# Patient Record
Sex: Male | Born: 1940
Health system: Southern US, Community
[De-identification: ages and names within clinical notes are randomized; demographics above are authoritative.]

## PROBLEM LIST (undated history)

## (undated) DIAGNOSIS — I10 Essential (primary) hypertension: Secondary | ICD-10-CM

## (undated) DIAGNOSIS — I4891 Unspecified atrial fibrillation: Secondary | ICD-10-CM

## (undated) DIAGNOSIS — I739 Peripheral vascular disease, unspecified: Secondary | ICD-10-CM

## (undated) DIAGNOSIS — E1140 Type 2 diabetes mellitus with diabetic neuropathy, unspecified: Secondary | ICD-10-CM

## (undated) DIAGNOSIS — N182 Chronic kidney disease, stage 2 (mild): Secondary | ICD-10-CM

## (undated) DIAGNOSIS — K219 Gastro-esophageal reflux disease without esophagitis: Secondary | ICD-10-CM

## (undated) DIAGNOSIS — I499 Cardiac arrhythmia, unspecified: Secondary | ICD-10-CM

## (undated) DIAGNOSIS — R9431 Abnormal electrocardiogram [ECG] [EKG]: Secondary | ICD-10-CM

## (undated) DIAGNOSIS — I459 Conduction disorder, unspecified: Secondary | ICD-10-CM

## (undated) DIAGNOSIS — C61 Malignant neoplasm of prostate: Secondary | ICD-10-CM

## (undated) DIAGNOSIS — Z720 Tobacco use: Secondary | ICD-10-CM

## (undated) DIAGNOSIS — I480 Paroxysmal atrial fibrillation: Secondary | ICD-10-CM

## (undated) DIAGNOSIS — E119 Type 2 diabetes mellitus without complications: Secondary | ICD-10-CM

## (undated) DIAGNOSIS — I129 Hypertensive chronic kidney disease with stage 1 through stage 4 chronic kidney disease, or unspecified chronic kidney disease: Secondary | ICD-10-CM

## (undated) DIAGNOSIS — H9190 Unspecified hearing loss, unspecified ear: Secondary | ICD-10-CM

## (undated) HISTORY — DX: Malignant neoplasm of prostate: C61

---

## 2003-02-27 ENCOUNTER — Emergency Department (HOSPITAL_COMMUNITY): Admission: EM | Admit: 2003-02-27 | Discharge: 2003-02-27 | Payer: Self-pay | Admitting: Emergency Medicine

## 2004-01-01 ENCOUNTER — Inpatient Hospital Stay (HOSPITAL_COMMUNITY): Admission: EM | Admit: 2004-01-01 | Discharge: 2004-01-03 | Payer: Self-pay | Admitting: Emergency Medicine

## 2005-02-05 ENCOUNTER — Ambulatory Visit (HOSPITAL_COMMUNITY): Admission: RE | Admit: 2005-02-05 | Discharge: 2005-02-05 | Payer: Self-pay | Admitting: Family Medicine

## 2005-02-07 ENCOUNTER — Ambulatory Visit (HOSPITAL_COMMUNITY): Admission: RE | Admit: 2005-02-07 | Discharge: 2005-02-07 | Payer: Self-pay | Admitting: Family Medicine

## 2006-02-10 ENCOUNTER — Encounter (INDEPENDENT_AMBULATORY_CARE_PROVIDER_SITE_OTHER): Payer: Self-pay | Admitting: Specialist

## 2006-02-10 ENCOUNTER — Ambulatory Visit (HOSPITAL_COMMUNITY): Admission: RE | Admit: 2006-02-10 | Discharge: 2006-02-10 | Payer: Self-pay | Admitting: General Surgery

## 2007-07-13 ENCOUNTER — Ambulatory Visit (HOSPITAL_COMMUNITY): Admission: RE | Admit: 2007-07-13 | Discharge: 2007-07-13 | Payer: Self-pay | Admitting: Family Medicine

## 2008-10-17 ENCOUNTER — Ambulatory Visit (HOSPITAL_COMMUNITY): Admission: RE | Admit: 2008-10-17 | Discharge: 2008-10-17 | Payer: Self-pay | Admitting: Family Medicine

## 2008-10-18 ENCOUNTER — Ambulatory Visit (HOSPITAL_COMMUNITY): Admission: RE | Admit: 2008-10-18 | Discharge: 2008-10-18 | Payer: Self-pay | Admitting: Family Medicine

## 2009-07-03 ENCOUNTER — Ambulatory Visit (HOSPITAL_COMMUNITY): Admission: RE | Admit: 2009-07-03 | Discharge: 2009-07-03 | Payer: Self-pay | Admitting: Family Medicine

## 2009-07-31 ENCOUNTER — Ambulatory Visit (HOSPITAL_COMMUNITY)
Admission: RE | Admit: 2009-07-31 | Discharge: 2009-07-31 | Payer: Self-pay | Source: Home / Self Care | Admitting: Family Medicine

## 2010-05-07 ENCOUNTER — Ambulatory Visit (HOSPITAL_COMMUNITY)
Admission: RE | Admit: 2010-05-07 | Discharge: 2010-05-07 | Payer: Self-pay | Source: Home / Self Care | Attending: Family Medicine | Admitting: Family Medicine

## 2010-05-13 ENCOUNTER — Encounter: Payer: Self-pay | Admitting: Family Medicine

## 2010-09-07 NOTE — H&P (Signed)
NAME:  Steven Boyd, Steven Boyd                       ACCOUNT NO.:  [id number]   MEDICAL RECORD NO.:  [id number]                   PATIENT TYPE:  INP   LOCATION:  5502                                 FACILITY:  [facility]   PHYSICIAN:  Renato Battles, M.D.                  DATE OF BIRTH:  December 10, 1940   DATE OF ADMISSION:  12/31/2003  DATE OF DISCHARGE:                                HISTORY & PHYSICAL   REASON FOR ADMISSION:  Dizziness.   HPI:  The patient is a very pleasant 70 year old white male who has been  experiencing on and off episodes of right hemicranial headache and dizziness  for the last few days with occasional slurred speech, confusion and  forgetfulness.  No weakness, no chest pain, no shortness of breath.  The  patient went to his primary care physician with Dr. Regino Schultze in [place],  [place] for evaluation.  Head CT did not show abnormalities  reportedly, and the patient was scheduled for MRI on Monday, September 12.   REVIEW OF SYSTEMS:  CONSTITUTIONAL:  No symptoms.  No fever, chills or night  sweats.  No weight changes.  GI:  No nausea, vomiting or diarrhea.  Positive  for occasional constipation.  No abdominal pain.  CARDIOPULMONARY:  No  cough, no shortness of breath, no chest pain, no orthopnea or PND.  GU:  No  dysuria, hematuria or retention.   PAST MEDICAL HISTORY:  1.  Hypertension.  2.  Type 2 diabetes.  3.  Hypercholesterolemia.   PAST SURGICAL HISTORY:  Right inguinal hernia repair.   SOCIAL HISTORY:  The patient smokes two packs per day for the last 46 years.  He has been trying to quit for the last few days, replacing cigarettes by  chewing tobacco.  The patient denies alcohol and drugs.  He is a Ecologist.  He lives with his wife in [place].   FAMILY HISTORY:  Noncontributory.   ALLERGIES:  No known drug allergies.   HOME MEDICATIONS:  1.  Atenolol 50 mg p.o. daily.  2.  Cozaar 100 mg p.o. daily.  3.  Hydrochlorothiazide 12.5 mg p.o.  daily.  4.  Lipitor 20 mg p.o. daily.  5.  Glucophage XR 1000 mg p.o. daily.  6.  Aspirin 81 mg p.o. daily.   PHYSICAL EXAMINATION:  GENERAL:  Alert and oriented x 3, no acute distress.  Overweight white male who is comfortable.  VITALS:  Temperature 97.8, heart rate 51, respiratory rate 18, blood  pressure 158/76, O2 sat 99% on room air.  HEENT:  The head is atraumatic and normocephalic.  Pupils are equal, round  and reactive to light and accommodation.  Extraocular movements are intact  bilaterally.  NECK:  No adenopathy, no thyromegaly, no JVD.  Questionable right-sided  carotid bruit.  CHEST:  Clear to auscultation bilaterally.  No wheezing.  HEART:  Regular rate and rhythm.  No murmurs.  ABDOMEN:  Soft, nontender, nondistended.  Hypoactive bowel sounds.  EXTREMITIES:  No cyanosis or clubbing.  The patient has signs of chronic  venous stasis bilaterally.  NEUROLOGIC:  Cranial nerves grossly intact.  There are no sensory or motor  deficits.   STUDIES:  Head CT showed vague right occipital abnormality.   EKG showed first-degree block.   Electrolytes were all within normal limits, including kidney function.   CBC showed a normal white count, elevated hemoglobin at 15.1.   ASSESSMENT AND PLAN:  1.  Transient ischemic attack.  The patient's symptoms are very suspicious      for transient ischemic attack.  We will start the patient on a high dose      of aspirin.  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  2.  Type 2 diabetes.  Continue with the Glucophage.  The patient will be      having ADA diet, Accu-Cheks  and a sliding scale of insulin.  3.  Hypertension.  Continue Cozaar, hydrochlorothiazide and atenolol.      Monitor closely.  4.  Hypercholesterolemia.  Check fasting lipids.  Continue Lipitor.  5.  EKG abnormality.  With the  patient's risk factors, we are going to check      cardiac enzymes and admit patient to telemetry unit.   COORDINATION OF CARE:  The patient has an appointment to receive MRI and  Doppler as an outpatient.  These need to be canceled.  Also, the patient was  supposed to get an appointment to see Dr. Anne Hahn from neurology.  This  probably can be arranged while the patient is in the hospital.                                                Renato Battles, M.D.    SA/MEDQ  D:  01/01/2004  T:  01/01/2004  Job:  [id number]   cc:   Kirk Ruths, M.D.  [address]  [place]  [place] [address]  Fax: [phone]

## 2010-09-07 NOTE — Consult Note (Signed)
NAME:  Steven, Boyd                       ACCOUNT NO.:  [id number]   MEDICAL RECORD NO.:  [id number]                   PATIENT TYPE:  INP   LOCATION:  3038                                 FACILITY:  [facility]   PHYSICIAN:  Marlan Palau, M.D.               DATE OF BIRTH:  10-May-1940   DATE OF CONSULTATION:  01/02/2004  DATE OF DISCHARGE:                                   CONSULTATION   HISTORY OF PRESENT ILLNESS:  Steven Boyd is a 70 year old right-  handed white male born Nov 09, 1940, with a history of diabetes and  tobacco abuse who comes in for an evaluation of headache and dizziness off  and on for 4-5 days prior to admission.  Patient has had in the past  episodes of hard, sharp, jabbing pains lasting only a minute or two in the  right vertex of the head.  Patient also had an episode of dizziness, similar  to what he experienced at this point in October, 2004, unassociated with  headache.  The patient, however, had both headache that was brief in nature  as well as lightheaded dizzy sensations that began prior to admission.  The  headache has not persisted but the dizziness has been off and on associated  with a light floating feeling, no true vertigo, no numbness or weakness on  the face, arms or legs, speech changes, double vision, loss of vision,  swallowing problems, gait disturbance or loss of consciousness.  Patient has  undergone an MRI scan of the brain showing no significant intracranial  pathology, small arachnoid cyst 2-3 cm in the right posterior fossa was  seen.  Patient has had an intracranial MRI angiogram that is unremarkable.  Carotid Doppler study is unremarkable.  Neurology is asked to see this  patient in further evaluation.  The patient has not had any nausea or  vomiting with the above complaints.  Patient does report some blurriness of  vision but has not gotten glasses.   PAST MEDICAL HISTORY:  Significant for:  1.  History of headache and  dizziness as above.  2.  Hernia surgery on the right for inguinal hernia in the past.  3.  History of obesity.  4.  Hypertension.  5.  Diabetes.  6.  Hypercholesterolemia.   Patient just recently quit 4 days ago smoking 2 packs a day, does not drink  alcohol.  Patient has NO KNOWN ALLERGIES.   SOCIAL HISTORY:  Patient is married.  He lives in the [place] area.  He has 4 children who are alive and well.  Patient is a  Naval architect.   FAMILY MEDICAL HISTORY:  Mother died with diabetes, heart disease, had a  cardiac pacer.  Patient's father is alive.  One sister with  hypercholesterolemia.  Three brothers with heart disease, diabetes.  Two  brothers have bipolar disorder.   REVIEW OF SYSTEMS:  Notable  for no recent fevers, chills.  Patient does note  some headache.  Denies neck stiffness.  Denies shortness of breath, chest  pain and has occasional cramp in the right neck.  The patient denies any  problems controlling the bowels or the bladder.  Has some hesitancy both  urination and with the bladder.  Denies any blackout episodes.   PHYSICAL EXAMINATION:  VITALS:  Blood pressure is 133/80, heart rate 57,  respiratory rate 20, temperature afebrile.  In general, the patient is a  moderately, markedly obese white male who is alert, cooperative at the time  of examination.  HEENT EXAMINATION:  Head is atraumatic.  Eyes pupils are equal, round, react  to light, disks are flat  bilaterally.  NECK:  Supple, no carotid bruits are noted.  RESPIRATORY EXAMINATION:  Clear.  CARDIOVASCULAR EXAMINATION:  Reveals a regular rate and rhythm, no obvious  murmurs or rubs noted.  EXTREMITIES:  Are notable for 1-2+ edema bilaterally.  NEUROLOGIC EXAMINATION:  Cranial nerves as above.  Facial asymmetry is  present.  Patient has good sensation to pinprick and soft touch bilaterally.  He has good strength in facial muscles, head turning and shoulder shrug  bilaterally.  Speech is  well enunciated, not aphasic.  Tympanic membranes  are clear bilaterally.  Again, extraocular movements are full, visual fields  are full.  Motor testing reveals 5/5 strength in all fours, good symmetric  motor tone is noted throughout.  Sensory testing reveals some mild decrease  in vibratory sensation of the feet, otherwise pinprick soft to touch,  vibratory sensation is intact throughout.  The patient has good finger-to-  nose, finger-to-toe-to-finger bilaterally.  Patient is able to ambulate  fairly well, has fair tandem gait, Romberg negative, no evidence of pronator  drift is seen.  Deep tendon reflexes reveal depressed ankle jerks  bilaterally, otherwise reflexes are depressed but symmetric throughout.  Toes are neutral bilaterally.   LABORATORY VALUES:  Notable for a CK level of 116, troponin-I of .02, CK MB  1.7, cholesterol panel of 150, total cholesterol with triglycerides 178, HDL  44, VLDL 36, LDL of 70, TSH of 1.61, white count of 7.8, hemoglobin 15.1,  hematocrit 43.3, MCV 89.6, platelets of 162, sodium 138, potassium 4.1,  chloride of 107, glucose of 90, BUN 13, creatinine 1.1, hemoglobin A1c 5.9.   MRI scan of the brain is as above.  Drug screen was negative.  Carotid  Doppler study is unremarkable.  CT scan of the head was negative.   IMPRESSION:  1.  Headache and dizziness.  2.  Ice pick pains right vertex.  3.  Diabetes.  4.  Tobacco abuse.   This patient just recently quit smoking, may have had some increased  problems with dizziness.  Headache associated with this.  Patient reports  episode of dizziness when smoking at times.  Patient has had a full workup  that has included MRI of the brain, intracranial MRI angiogram, carotid  Doppler studies, all of which were unremarkable.  The patient's current  symptomatology is likely benign, therefore.  The patient has posterior fossa arachnoid cyst likely unrelated to his current symptomatology.  Patient does  report  sharp jabbing pains in the right vertex that appear to be consistent  with ice pick pains except they do not migrate.  The patient's headaches and  dizziness may be potentially migrainous in nature, therefore.  I would not  treat the headache or the dizziness  specifically at this point.  Patient  could potentially be discharged soon.  I will be happy to followup in the office with this patient if needed.  No  further workup indicated at this point.  Patient claims he is on low dose  aspirin and will continue low dose aspirin at this point.                                               Marlan Palau, M.D.    CKW/MEDQ  D:  01/02/2004  T:  01/02/2004  Job:  [id number]   cc:   Kirk Ruths, M.D.  [address]  [place]  [place] [address]  Fax: [phone]   Renato Battles, M.D.  Fax: [phone]   [facility]  [address]  [address]  [place], [place] [address]

## 2010-09-07 NOTE — H&P (Signed)
Steven Boyd, Steven Boyd             ACCOUNT NO.:  [id number]   MEDICAL RECORD NO.:  [id number]           PATIENT TYPE:  AMB   LOCATION:                                FACILITY:  [facility]   PHYSICIAN:  Dalia Heading, M.D.  DATE OF BIRTH:  1940/06/09   DATE OF ADMISSION:  01/27/2006  DATE OF DISCHARGE:  LH                                HISTORY & PHYSICAL   CHIEF COMPLAINT:  Family history of colon carcinoma.   HISTORY OF PRESENT ILLNESS:  The patient is a 70 year old white male who is  referred for endoscopic evaluation.  He needs a colonoscopy for family  history of colon carcinoma.  One of his brothers was diagnosed with colon  cancer and is still alive after surgery.  He did not need chemotherapy.  No  abdominal pain, weight loss, nausea, vomiting, diarrhea, constipation,  melena, or hematochezia have been noted.  He has never had a colonoscopy.   PAST MEDICAL HISTORY:  Includes hypertension, non-insulin-dependent diabetes  mellitus.   PAST SURGICAL HISTORY:  Unremarkable.   CURRENT MEDICATIONS:  Metformin, Prinivil, baby aspirin, fish oil, atenolol,  hydrochlorothiazide.   ALLERGIES:  No known drug allergies.   REVIEW OF SYSTEMS:  The patient smokes a pack cigarettes a day.  He denies  any alcohol use.   PHYSICAL EXAMINATION:  GENERAL:  The patient is a well-developed, well-  nourished white male in no acute distress.  LUNGS:  Clear to auscultation with equal breath sounds bilaterally.  HEART:  Reveals a regular rate and rhythm without S3, S4, or murmurs.  ABDOMEN:  Soft, nontender, nondistended.  No hepatosplenomegaly or masses  noted.  RECTAL:  Examination was deferred to the procedure.   IMPRESSION:  Family history of colon carcinoma.   PLAN:  The patient is scheduled for a colonoscopy on January 27, 2006.  The  risks and benefits of the procedure including bleeding and perforation were  fully explained to the patient, who gave informed consent.      Dalia Heading,  M.D.  Electronically Signed     MAJ/MEDQ  D:  01/14/2006  T:  01/14/2006  Job:  [id number]   cc:   Kirk Ruths, M.D.  Fax: [phone]

## 2011-02-18 ENCOUNTER — Ambulatory Visit (HOSPITAL_COMMUNITY)
Admission: RE | Admit: 2011-02-18 | Discharge: 2011-02-18 | Disposition: A | Discharge status: Home / Self Care | Payer: Medicare Other | Source: Ambulatory Visit | Attending: Family Medicine | Admitting: Family Medicine

## 2011-02-18 ENCOUNTER — Other Ambulatory Visit (HOSPITAL_COMMUNITY): Payer: Self-pay | Admitting: Family Medicine

## 2011-02-18 DIAGNOSIS — R059 Cough, unspecified: Secondary | ICD-10-CM | POA: Insufficient documentation

## 2011-02-18 DIAGNOSIS — R05 Cough: Secondary | ICD-10-CM | POA: Insufficient documentation

## 2011-08-26 DIAGNOSIS — E119 Type 2 diabetes mellitus without complications: Secondary | ICD-10-CM | POA: Diagnosis not present

## 2011-08-26 DIAGNOSIS — K219 Gastro-esophageal reflux disease without esophagitis: Secondary | ICD-10-CM | POA: Diagnosis not present

## 2011-08-26 DIAGNOSIS — I10 Essential (primary) hypertension: Secondary | ICD-10-CM | POA: Diagnosis not present

## 2011-09-20 DIAGNOSIS — E119 Type 2 diabetes mellitus without complications: Secondary | ICD-10-CM | POA: Diagnosis not present

## 2011-12-30 DIAGNOSIS — L259 Unspecified contact dermatitis, unspecified cause: Secondary | ICD-10-CM | POA: Diagnosis not present

## 2011-12-30 DIAGNOSIS — Z23 Encounter for immunization: Secondary | ICD-10-CM | POA: Diagnosis not present

## 2011-12-30 DIAGNOSIS — E119 Type 2 diabetes mellitus without complications: Secondary | ICD-10-CM | POA: Diagnosis not present

## 2011-12-30 DIAGNOSIS — Z6830 Body mass index (BMI) 30.0-30.9, adult: Secondary | ICD-10-CM | POA: Diagnosis not present

## 2011-12-30 DIAGNOSIS — I10 Essential (primary) hypertension: Secondary | ICD-10-CM | POA: Diagnosis not present

## 2011-12-30 DIAGNOSIS — Z713 Dietary counseling and surveillance: Secondary | ICD-10-CM | POA: Diagnosis not present

## 2012-05-04 DIAGNOSIS — E785 Hyperlipidemia, unspecified: Secondary | ICD-10-CM | POA: Diagnosis not present

## 2012-05-04 DIAGNOSIS — I10 Essential (primary) hypertension: Secondary | ICD-10-CM | POA: Diagnosis not present

## 2012-05-04 DIAGNOSIS — Z6829 Body mass index (BMI) 29.0-29.9, adult: Secondary | ICD-10-CM | POA: Diagnosis not present

## 2012-05-04 DIAGNOSIS — Z125 Encounter for screening for malignant neoplasm of prostate: Secondary | ICD-10-CM | POA: Diagnosis not present

## 2012-05-04 DIAGNOSIS — E1129 Type 2 diabetes mellitus with other diabetic kidney complication: Secondary | ICD-10-CM | POA: Diagnosis not present

## 2012-07-17 ENCOUNTER — Emergency Department (HOSPITAL_COMMUNITY)
Admission: EM | Admit: 2012-07-17 | Discharge: 2012-07-17 | Disposition: A | Discharge status: Home / Self Care | Payer: Medicare Other | Attending: Emergency Medicine | Admitting: Emergency Medicine

## 2012-07-17 ENCOUNTER — Encounter (HOSPITAL_COMMUNITY): Payer: Self-pay | Admitting: *Deleted

## 2012-07-17 DIAGNOSIS — I10 Essential (primary) hypertension: Secondary | ICD-10-CM | POA: Diagnosis not present

## 2012-07-17 DIAGNOSIS — Z7982 Long term (current) use of aspirin: Secondary | ICD-10-CM | POA: Insufficient documentation

## 2012-07-17 DIAGNOSIS — W57XXXA Bitten or stung by nonvenomous insect and other nonvenomous arthropods, initial encounter: Secondary | ICD-10-CM | POA: Diagnosis not present

## 2012-07-17 DIAGNOSIS — Z79899 Other long term (current) drug therapy: Secondary | ICD-10-CM | POA: Diagnosis not present

## 2012-07-17 DIAGNOSIS — Y929 Unspecified place or not applicable: Secondary | ICD-10-CM | POA: Insufficient documentation

## 2012-07-17 DIAGNOSIS — S30860A Insect bite (nonvenomous) of lower back and pelvis, initial encounter: Secondary | ICD-10-CM | POA: Insufficient documentation

## 2012-07-17 DIAGNOSIS — Y9389 Activity, other specified: Secondary | ICD-10-CM | POA: Insufficient documentation

## 2012-07-17 HISTORY — DX: Essential (primary) hypertension: I10

## 2012-07-17 MED ORDER — LIDOCAINE HCL (PF) 1 % IJ SOLN
INTRAMUSCULAR | Status: AC
  Administered 2012-07-17: 04:00:00
  Filled 2012-07-17: qty 5

## 2012-07-17 MED ORDER — DOXYCYCLINE HYCLATE 100 MG PO CAPS: 100.0000 mg | ORAL_CAPSULE | Freq: Two times a day (BID) | ORAL | Status: DC

## 2012-07-17 MED ORDER — DOXYCYCLINE HYCLATE 100 MG PO TABS
100.0000 mg | ORAL_TABLET | Freq: Once | ORAL | Status: AC
  Administered 2012-07-17: 100 mg via ORAL
  Filled 2012-07-17: qty 1

## 2012-07-17 NOTE — ED Notes (Signed)
Patient states he attempted to remove tick from left lower hip and left parts of tick embedded in his skin. Redness noted to area and black center

## 2012-07-17 NOTE — ED Provider Notes (Signed)
History     CSN: [id number]  Arrival date & time 07/17/12  0229   First MD Initiated Contact with Patient 07/17/12 0243      Chief Complaint  Patient presents with  . Tick Removal    (Consider location/radiation/quality/duration/timing/severity/associated sxs/prior treatment) HPI Steven Boyd is a 72 y.o. male who presents to the Emergency Department complaining of a tick bite to the lower left side of his abdomen that may have occurred on Saturday. He removed a portion of the tick but did not get all of the tick.    Past Medical History  Diagnosis Date  . Hypertension     History reviewed. No pertinent past surgical history.  History reviewed. No pertinent family history.  History  Substance Use Topics  . Smoking status: Not on file  . Smokeless tobacco: Not on file  . Alcohol Use: Not on file      Review of Systems  Constitutional: Negative for fever.       10 Systems reviewed and are negative for acute change except as noted in the HPI.  HENT: Negative for congestion.   Eyes: Negative for discharge and redness.  Respiratory: Negative for cough and shortness of breath.   Cardiovascular: Negative for chest pain.  Gastrointestinal: Negative for vomiting and abdominal pain.  Musculoskeletal: Negative for back pain.  Skin: Negative for rash.  Neurological: Negative for syncope, numbness and headaches.  Psychiatric/Behavioral:       No behavior change.    Allergies  Penicillins  Home Medications   Current Outpatient Rx  Name  Route  Sig  Dispense  Refill  . aspirin 81 MG tablet   Oral   Take 81 mg by mouth daily.         Marland Kitchen atenolol (TENORMIN) 50 MG tablet   Oral   Take 50 mg by mouth daily.         Marland Kitchen lisinopril (PRINIVIL,ZESTRIL) 40 MG tablet   Oral   Take 40 mg by mouth daily.         Marland Kitchen doxycycline (VIBRAMYCIN) 100 MG capsule   Oral   Take 1 capsule (100 mg total) by mouth 2 (two) times daily.   20 capsule   0     BP 179/95   Pulse 54  Temp(Src) 97.7 F (36.5 C) (Oral)  Resp 20  Ht 6\' 2"  (1.88 m)  Wt 215 lb (97.523 kg)  BMI 27.59 kg/m2  SpO2 100%  Physical Exam  Nursing note and vitals reviewed. Constitutional: He appears well-developed and well-nourished.  Awake, alert, nontoxic appearance.  HENT:  Head: Atraumatic.  Eyes: EOM are normal. Pupils are equal, round, and reactive to light. Right eye exhibits no discharge. Left eye exhibits no discharge.  Neck: Neck supple.  Cardiovascular: Normal rate and intact distal pulses.   Pulmonary/Chest: Effort normal and breath sounds normal. He exhibits no tenderness.  Abdominal: Soft. Bowel sounds are normal. There is no tenderness. There is no rebound.  Musculoskeletal: He exhibits no tenderness.  Baseline ROM, no obvious new focal weakness.  Neurological:  Mental status and motor strength appears baseline for patient and situation.  Skin: No rash noted.  Portion of tick in lesion to left lower abdomen.   Psychiatric: He has a normal mood and affect.    ED Course  Procedures (including critical care time)  FOREIGN BODY REMOVAL Using lidocaine 1% 3 ml for anesthesia, numbed area Draped and prepped site Betadine placed on area Using tweezers removed remainder of  tick from lesion on left side of abdomen Patient tolerated procedure well.        1. Tick bite       MDM  Patient with a partial tick on left lower abdomen. Removal successful. Bandaid applied. Doxycycline begun. Pt stable in ED with no significant deterioration in condition.The patient appears reasonably screened and/or stabilized for discharge and I doubt any other medical condition or other [facility] requiring further screening, evaluation, or treatment in the ED at this time prior to discharge.  MDM Reviewed: nursing note and vitals           Nicoletta Dress. Colon Branch, MD 07/17/12 [id number]

## 2012-08-03 DIAGNOSIS — E785 Hyperlipidemia, unspecified: Secondary | ICD-10-CM | POA: Diagnosis not present

## 2012-08-03 DIAGNOSIS — Z6828 Body mass index (BMI) 28.0-28.9, adult: Secondary | ICD-10-CM | POA: Diagnosis not present

## 2012-08-03 DIAGNOSIS — E1129 Type 2 diabetes mellitus with other diabetic kidney complication: Secondary | ICD-10-CM | POA: Diagnosis not present

## 2012-08-03 DIAGNOSIS — I10 Essential (primary) hypertension: Secondary | ICD-10-CM | POA: Diagnosis not present

## 2013-02-08 ENCOUNTER — Ambulatory Visit (HOSPITAL_COMMUNITY)
Admission: RE | Admit: 2013-02-08 | Discharge: 2013-02-08 | Disposition: A | Discharge status: Home / Self Care | Payer: Medicare Other | Source: Ambulatory Visit | Attending: Family Medicine | Admitting: Family Medicine

## 2013-02-08 ENCOUNTER — Other Ambulatory Visit (HOSPITAL_COMMUNITY): Payer: Self-pay | Admitting: Family Medicine

## 2013-02-08 DIAGNOSIS — R059 Cough, unspecified: Secondary | ICD-10-CM | POA: Diagnosis not present

## 2013-02-08 DIAGNOSIS — I10 Essential (primary) hypertension: Secondary | ICD-10-CM

## 2013-02-08 DIAGNOSIS — F17200 Nicotine dependence, unspecified, uncomplicated: Secondary | ICD-10-CM | POA: Diagnosis not present

## 2013-02-08 DIAGNOSIS — R079 Chest pain, unspecified: Secondary | ICD-10-CM | POA: Insufficient documentation

## 2013-02-08 DIAGNOSIS — Z6827 Body mass index (BMI) 27.0-27.9, adult: Secondary | ICD-10-CM | POA: Diagnosis not present

## 2013-02-08 DIAGNOSIS — E119 Type 2 diabetes mellitus without complications: Secondary | ICD-10-CM

## 2013-02-08 DIAGNOSIS — R05 Cough: Secondary | ICD-10-CM | POA: Insufficient documentation

## 2013-02-08 DIAGNOSIS — J984 Other disorders of lung: Secondary | ICD-10-CM | POA: Diagnosis not present

## 2013-02-08 DIAGNOSIS — N390 Urinary tract infection, site not specified: Secondary | ICD-10-CM | POA: Diagnosis not present

## 2013-02-08 DIAGNOSIS — R634 Abnormal weight loss: Secondary | ICD-10-CM | POA: Diagnosis not present

## 2013-02-08 DIAGNOSIS — Z23 Encounter for immunization: Secondary | ICD-10-CM | POA: Diagnosis not present

## 2013-02-16 ENCOUNTER — Other Ambulatory Visit: Payer: Self-pay | Admitting: Urology

## 2013-02-16 ENCOUNTER — Encounter (INDEPENDENT_AMBULATORY_CARE_PROVIDER_SITE_OTHER): Payer: Self-pay

## 2013-02-16 ENCOUNTER — Ambulatory Visit (INDEPENDENT_AMBULATORY_CARE_PROVIDER_SITE_OTHER): Payer: Medicare Other | Admitting: Urology

## 2013-02-16 DIAGNOSIS — R350 Frequency of micturition: Secondary | ICD-10-CM

## 2013-02-16 DIAGNOSIS — R3915 Urgency of urination: Secondary | ICD-10-CM | POA: Diagnosis not present

## 2013-02-16 DIAGNOSIS — R3129 Other microscopic hematuria: Secondary | ICD-10-CM

## 2013-02-16 DIAGNOSIS — M549 Dorsalgia, unspecified: Secondary | ICD-10-CM

## 2013-02-22 ENCOUNTER — Ambulatory Visit (HOSPITAL_COMMUNITY)
Admission: RE | Admit: 2013-02-22 | Discharge: 2013-02-22 | Disposition: A | Discharge status: Home / Self Care | Payer: Medicare Other | Source: Ambulatory Visit | Attending: Urology | Admitting: Urology

## 2013-02-22 ENCOUNTER — Encounter (HOSPITAL_COMMUNITY): Payer: Self-pay

## 2013-02-22 DIAGNOSIS — R911 Solitary pulmonary nodule: Secondary | ICD-10-CM | POA: Diagnosis not present

## 2013-02-22 DIAGNOSIS — R3129 Other microscopic hematuria: Secondary | ICD-10-CM

## 2013-02-22 DIAGNOSIS — N200 Calculus of kidney: Secondary | ICD-10-CM | POA: Diagnosis not present

## 2013-02-22 DIAGNOSIS — R319 Hematuria, unspecified: Secondary | ICD-10-CM | POA: Diagnosis not present

## 2013-02-22 DIAGNOSIS — N400 Enlarged prostate without lower urinary tract symptoms: Secondary | ICD-10-CM | POA: Diagnosis not present

## 2013-02-22 MED ORDER — SODIUM CHLORIDE 0.9 % IV SOLN
INTRAVENOUS | Status: AC
  Filled 2013-02-22: qty 250

## 2013-02-22 MED ORDER — IOHEXOL 300 MG/ML  SOLN
125.0000 mL | Freq: Once | INTRAMUSCULAR | Status: AC | PRN
  Administered 2013-02-22: 125 mL via INTRAVENOUS

## 2013-03-09 ENCOUNTER — Ambulatory Visit (INDEPENDENT_AMBULATORY_CARE_PROVIDER_SITE_OTHER): Payer: Medicare Other | Admitting: Urology

## 2013-03-09 DIAGNOSIS — N401 Enlarged prostate with lower urinary tract symptoms: Secondary | ICD-10-CM | POA: Diagnosis not present

## 2013-03-09 DIAGNOSIS — R3129 Other microscopic hematuria: Secondary | ICD-10-CM

## 2013-03-09 DIAGNOSIS — N200 Calculus of kidney: Secondary | ICD-10-CM

## 2013-03-09 DIAGNOSIS — N529 Male erectile dysfunction, unspecified: Secondary | ICD-10-CM | POA: Diagnosis not present

## 2013-05-13 DIAGNOSIS — J111 Influenza due to unidentified influenza virus with other respiratory manifestations: Secondary | ICD-10-CM | POA: Diagnosis not present

## 2013-05-13 DIAGNOSIS — Z6827 Body mass index (BMI) 27.0-27.9, adult: Secondary | ICD-10-CM | POA: Diagnosis not present

## 2013-08-09 DIAGNOSIS — Z23 Encounter for immunization: Secondary | ICD-10-CM | POA: Diagnosis not present

## 2013-08-09 DIAGNOSIS — Z6827 Body mass index (BMI) 27.0-27.9, adult: Secondary | ICD-10-CM | POA: Diagnosis not present

## 2013-08-09 DIAGNOSIS — K219 Gastro-esophageal reflux disease without esophagitis: Secondary | ICD-10-CM | POA: Diagnosis not present

## 2013-08-09 DIAGNOSIS — E119 Type 2 diabetes mellitus without complications: Secondary | ICD-10-CM | POA: Diagnosis not present

## 2013-08-09 DIAGNOSIS — I10 Essential (primary) hypertension: Secondary | ICD-10-CM | POA: Diagnosis not present

## 2014-02-14 ENCOUNTER — Other Ambulatory Visit (HOSPITAL_COMMUNITY): Payer: Self-pay | Admitting: Internal Medicine

## 2014-02-14 DIAGNOSIS — E119 Type 2 diabetes mellitus without complications: Secondary | ICD-10-CM | POA: Diagnosis not present

## 2014-02-14 DIAGNOSIS — Z23 Encounter for immunization: Secondary | ICD-10-CM | POA: Diagnosis not present

## 2014-02-14 DIAGNOSIS — R109 Unspecified abdominal pain: Secondary | ICD-10-CM

## 2014-02-14 DIAGNOSIS — I10 Essential (primary) hypertension: Secondary | ICD-10-CM | POA: Diagnosis not present

## 2014-02-14 DIAGNOSIS — E663 Overweight: Secondary | ICD-10-CM | POA: Diagnosis not present

## 2014-02-14 DIAGNOSIS — R079 Chest pain, unspecified: Secondary | ICD-10-CM | POA: Diagnosis not present

## 2014-02-14 DIAGNOSIS — Z6827 Body mass index (BMI) 27.0-27.9, adult: Secondary | ICD-10-CM | POA: Diagnosis not present

## 2014-02-17 ENCOUNTER — Ambulatory Visit (HOSPITAL_COMMUNITY): Payer: Medicare Other

## 2014-02-21 ENCOUNTER — Other Ambulatory Visit (HOSPITAL_COMMUNITY): Payer: 59

## 2014-02-21 ENCOUNTER — Ambulatory Visit (HOSPITAL_COMMUNITY)
Admission: RE | Admit: 2014-02-21 | Discharge: 2014-02-21 | Disposition: A | Discharge status: Home / Self Care | Payer: Medicare Other | Source: Ambulatory Visit | Attending: Internal Medicine | Admitting: Internal Medicine

## 2014-02-21 DIAGNOSIS — I714 Abdominal aortic aneurysm, without rupture: Secondary | ICD-10-CM | POA: Diagnosis not present

## 2014-02-21 DIAGNOSIS — R1013 Epigastric pain: Secondary | ICD-10-CM | POA: Diagnosis not present

## 2014-02-21 DIAGNOSIS — R109 Unspecified abdominal pain: Secondary | ICD-10-CM | POA: Diagnosis not present

## 2014-02-28 DIAGNOSIS — Z720 Tobacco use: Secondary | ICD-10-CM | POA: Diagnosis not present

## 2014-02-28 DIAGNOSIS — E119 Type 2 diabetes mellitus without complications: Secondary | ICD-10-CM | POA: Diagnosis not present

## 2014-02-28 DIAGNOSIS — I209 Angina pectoris, unspecified: Secondary | ICD-10-CM | POA: Diagnosis not present

## 2014-02-28 DIAGNOSIS — I10 Essential (primary) hypertension: Secondary | ICD-10-CM | POA: Diagnosis not present

## 2014-03-14 DIAGNOSIS — Z720 Tobacco use: Secondary | ICD-10-CM | POA: Diagnosis not present

## 2014-03-14 DIAGNOSIS — I10 Essential (primary) hypertension: Secondary | ICD-10-CM | POA: Diagnosis not present

## 2014-03-14 DIAGNOSIS — I209 Angina pectoris, unspecified: Secondary | ICD-10-CM | POA: Diagnosis not present

## 2014-03-14 DIAGNOSIS — E119 Type 2 diabetes mellitus without complications: Secondary | ICD-10-CM | POA: Diagnosis not present

## 2014-05-23 DIAGNOSIS — H43393 Other vitreous opacities, bilateral: Secondary | ICD-10-CM | POA: Diagnosis not present

## 2014-05-30 DIAGNOSIS — E119 Type 2 diabetes mellitus without complications: Secondary | ICD-10-CM | POA: Diagnosis not present

## 2014-05-30 DIAGNOSIS — E1140 Type 2 diabetes mellitus with diabetic neuropathy, unspecified: Secondary | ICD-10-CM | POA: Diagnosis not present

## 2014-05-30 DIAGNOSIS — Z23 Encounter for immunization: Secondary | ICD-10-CM | POA: Diagnosis not present

## 2014-05-30 DIAGNOSIS — Z125 Encounter for screening for malignant neoplasm of prostate: Secondary | ICD-10-CM | POA: Diagnosis not present

## 2014-05-30 DIAGNOSIS — E663 Overweight: Secondary | ICD-10-CM | POA: Diagnosis not present

## 2014-05-30 DIAGNOSIS — Z6828 Body mass index (BMI) 28.0-28.9, adult: Secondary | ICD-10-CM | POA: Diagnosis not present

## 2014-05-30 DIAGNOSIS — I10 Essential (primary) hypertension: Secondary | ICD-10-CM | POA: Diagnosis not present

## 2014-06-01 DIAGNOSIS — E119 Type 2 diabetes mellitus without complications: Secondary | ICD-10-CM | POA: Diagnosis not present

## 2014-06-20 DIAGNOSIS — N5201 Erectile dysfunction due to arterial insufficiency: Secondary | ICD-10-CM | POA: Diagnosis not present

## 2014-06-20 DIAGNOSIS — N401 Enlarged prostate with lower urinary tract symptoms: Secondary | ICD-10-CM | POA: Diagnosis not present

## 2014-06-20 DIAGNOSIS — R350 Frequency of micturition: Secondary | ICD-10-CM | POA: Diagnosis not present

## 2014-06-20 DIAGNOSIS — R972 Elevated prostate specific antigen [PSA]: Secondary | ICD-10-CM | POA: Diagnosis not present

## 2014-12-05 DIAGNOSIS — I10 Essential (primary) hypertension: Secondary | ICD-10-CM | POA: Diagnosis not present

## 2014-12-05 DIAGNOSIS — B353 Tinea pedis: Secondary | ICD-10-CM | POA: Diagnosis not present

## 2014-12-05 DIAGNOSIS — B351 Tinea unguium: Secondary | ICD-10-CM | POA: Diagnosis not present

## 2014-12-05 DIAGNOSIS — E119 Type 2 diabetes mellitus without complications: Secondary | ICD-10-CM | POA: Diagnosis not present

## 2014-12-05 DIAGNOSIS — Z6827 Body mass index (BMI) 27.0-27.9, adult: Secondary | ICD-10-CM | POA: Diagnosis not present

## 2014-12-05 DIAGNOSIS — Z1389 Encounter for screening for other disorder: Secondary | ICD-10-CM | POA: Diagnosis not present

## 2014-12-23 DIAGNOSIS — S72142A Displaced intertrochanteric fracture of left femur, initial encounter for closed fracture: Secondary | ICD-10-CM | POA: Diagnosis not present

## 2014-12-23 DIAGNOSIS — M25551 Pain in right hip: Secondary | ICD-10-CM | POA: Diagnosis not present

## 2014-12-23 DIAGNOSIS — S0101XA Laceration without foreign body of scalp, initial encounter: Secondary | ICD-10-CM | POA: Diagnosis not present

## 2014-12-23 DIAGNOSIS — W1789XA Other fall from one level to another, initial encounter: Secondary | ICD-10-CM | POA: Diagnosis not present

## 2014-12-23 DIAGNOSIS — G8911 Acute pain due to trauma: Secondary | ICD-10-CM | POA: Diagnosis not present

## 2014-12-23 DIAGNOSIS — M25552 Pain in left hip: Secondary | ICD-10-CM | POA: Diagnosis not present

## 2014-12-23 DIAGNOSIS — S098XXA Other specified injuries of head, initial encounter: Secondary | ICD-10-CM | POA: Diagnosis not present

## 2014-12-23 DIAGNOSIS — S060X0A Concussion without loss of consciousness, initial encounter: Secondary | ICD-10-CM | POA: Diagnosis not present

## 2014-12-24 ENCOUNTER — Telehealth: Payer: Self-pay | Admitting: Allergy & Immunology

## 2014-12-24 ENCOUNTER — Inpatient Hospital Stay (HOSPITAL_COMMUNITY): Payer: Medicare Other | Admitting: Anesthesiology

## 2014-12-24 ENCOUNTER — Inpatient Hospital Stay (HOSPITAL_COMMUNITY): Payer: Medicare Other

## 2014-12-24 ENCOUNTER — Inpatient Hospital Stay: Admit: 2014-12-24 | Payer: Self-pay | Admitting: Orthopedic Surgery

## 2014-12-24 ENCOUNTER — Inpatient Hospital Stay (HOSPITAL_COMMUNITY)
Admission: AD | Admit: 2014-12-24 | Discharge: 2014-12-27 | DRG: 481 | Disposition: A | Discharge status: Home Health | Payer: Medicare Other | Source: Other Acute Inpatient Hospital | Attending: Internal Medicine | Admitting: Internal Medicine

## 2014-12-24 ENCOUNTER — Encounter (HOSPITAL_COMMUNITY)
Admission: AD | Disposition: A | Discharge status: Home Health | Payer: Self-pay | Source: Other Acute Inpatient Hospital | Attending: Internal Medicine

## 2014-12-24 ENCOUNTER — Encounter (HOSPITAL_COMMUNITY): Payer: Self-pay | Admitting: Family

## 2014-12-24 DIAGNOSIS — W19XXXA Unspecified fall, initial encounter: Secondary | ICD-10-CM | POA: Diagnosis not present

## 2014-12-24 DIAGNOSIS — Z7982 Long term (current) use of aspirin: Secondary | ICD-10-CM

## 2014-12-24 DIAGNOSIS — S72132A Displaced apophyseal fracture of left femur, initial encounter for closed fracture: Secondary | ICD-10-CM | POA: Diagnosis not present

## 2014-12-24 DIAGNOSIS — Z8781 Personal history of (healed) traumatic fracture: Secondary | ICD-10-CM

## 2014-12-24 DIAGNOSIS — Z79899 Other long term (current) drug therapy: Secondary | ICD-10-CM

## 2014-12-24 DIAGNOSIS — Z008 Encounter for other general examination: Secondary | ICD-10-CM | POA: Diagnosis not present

## 2014-12-24 DIAGNOSIS — R001 Bradycardia, unspecified: Secondary | ICD-10-CM | POA: Diagnosis not present

## 2014-12-24 DIAGNOSIS — S7292XA Unspecified fracture of left femur, initial encounter for closed fracture: Secondary | ICD-10-CM

## 2014-12-24 DIAGNOSIS — S72142A Displaced intertrochanteric fracture of left femur, initial encounter for closed fracture: Secondary | ICD-10-CM | POA: Diagnosis not present

## 2014-12-24 DIAGNOSIS — H9190 Unspecified hearing loss, unspecified ear: Secondary | ICD-10-CM | POA: Diagnosis present

## 2014-12-24 DIAGNOSIS — W010XXD Fall on same level from slipping, tripping and stumbling without subsequent striking against object, subsequent encounter: Secondary | ICD-10-CM | POA: Diagnosis present

## 2014-12-24 DIAGNOSIS — E119 Type 2 diabetes mellitus without complications: Secondary | ICD-10-CM | POA: Diagnosis not present

## 2014-12-24 DIAGNOSIS — Z880 Allergy status to penicillin: Secondary | ICD-10-CM | POA: Diagnosis not present

## 2014-12-24 DIAGNOSIS — I10 Essential (primary) hypertension: Secondary | ICD-10-CM | POA: Diagnosis present

## 2014-12-24 DIAGNOSIS — D62 Acute posthemorrhagic anemia: Secondary | ICD-10-CM | POA: Diagnosis not present

## 2014-12-24 DIAGNOSIS — Z9889 Other specified postprocedural states: Secondary | ICD-10-CM

## 2014-12-24 DIAGNOSIS — S7222XA Displaced subtrochanteric fracture of left femur, initial encounter for closed fracture: Secondary | ICD-10-CM | POA: Diagnosis not present

## 2014-12-24 DIAGNOSIS — S72002A Fracture of unspecified part of neck of left femur, initial encounter for closed fracture: Secondary | ICD-10-CM

## 2014-12-24 HISTORY — DX: Unspecified hearing loss, unspecified ear: H91.90

## 2014-12-24 HISTORY — PX: FEMUR IM NAIL: SHX1597

## 2014-12-24 HISTORY — DX: Type 2 diabetes mellitus without complications: E11.9

## 2014-12-24 LAB — SURGICAL PCR SCREEN
MRSA, PCR: NEGATIVE
STAPHYLOCOCCUS AUREUS: POSITIVE — AB

## 2014-12-24 LAB — COMPREHENSIVE METABOLIC PANEL
ALBUMIN: 3.6 g/dL (ref 3.5–5.0)
ALK PHOS: 72 U/L (ref 38–126)
ALT: 23 U/L (ref 17–63)
AST: 28 U/L (ref 15–41)
Anion gap: 5 (ref 5–15)
BILIRUBIN TOTAL: 1.5 mg/dL — AB (ref 0.3–1.2)
BUN: 10 mg/dL (ref 6–20)
CALCIUM: 8.7 mg/dL — AB (ref 8.9–10.3)
CO2: 27 mmol/L (ref 22–32)
Chloride: 105 mmol/L (ref 101–111)
Creatinine, Ser: 0.97 mg/dL (ref 0.61–1.24)
GFR calc Af Amer: >60 mL/min (ref >60)
GLUCOSE: 135 mg/dL — AB (ref 65–99)
Potassium: 3.8 mmol/L (ref 3.5–5.1)
Sodium: 137 mmol/L (ref 135–145)
TOTAL PROTEIN: 6.5 g/dL (ref 6.5–8.1)

## 2014-12-24 LAB — CBC
HCT: 41.8 % (ref 39.0–52.0)
Hemoglobin: 14.2 g/dL (ref 13.0–17.0)
MCH: 30.7 pg (ref 26.0–34.0)
MCHC: 34 g/dL (ref 30.0–36.0)
MCV: 90.3 fL (ref 78.0–100.0)
PLATELETS: 128 10*3/uL — AB (ref 150–400)
RBC: 4.63 MIL/uL (ref 4.22–5.81)
RDW: 12.8 % (ref 11.5–15.5)
WBC: 8.4 10*3/uL (ref 4.0–10.5)

## 2014-12-24 LAB — TSH: TSH: 1.186 u[IU]/mL (ref 0.350–4.500)

## 2014-12-24 LAB — GLUCOSE, CAPILLARY: Glucose-Capillary: 106 mg/dL — ABNORMAL HIGH (ref 65–99)

## 2014-12-24 SURGERY — INSERTION, INTRAMEDULLARY ROD, FEMUR
Anesthesia: General | Site: Hip | Laterality: Left

## 2014-12-24 MED ORDER — CEFAZOLIN SODIUM-DEXTROSE 2-3 GM-% IV SOLR
INTRAVENOUS | Status: DC | PRN
  Administered 2014-12-24: 2 g via INTRAVENOUS

## 2014-12-24 MED ORDER — CEFAZOLIN SODIUM-DEXTROSE 2-3 GM-% IV SOLR
2.0000 g | INTRAVENOUS | Status: DC
  Filled 2014-12-24 (×2): qty 50

## 2014-12-24 MED ORDER — PROPOFOL 10 MG/ML IV BOLUS
INTRAVENOUS | Status: DC | PRN
  Administered 2014-12-24: 170 mg via INTRAVENOUS

## 2014-12-24 MED ORDER — MUPIROCIN 2 % EX OINT
TOPICAL_OINTMENT | Freq: Two times a day (BID) | CUTANEOUS | Status: DC
  Administered 2014-12-25 – 2014-12-26 (×3): via NASAL
  Administered 2014-12-26: 1 via NASAL
  Administered 2014-12-27: 10:00:00 via NASAL
  Filled 2014-12-24 (×2): qty 22

## 2014-12-24 MED ORDER — ACETAMINOPHEN 650 MG RE SUPP: 650.0000 mg | Freq: Four times a day (QID) | RECTAL | Status: DC | PRN

## 2014-12-24 MED ORDER — BACLOFEN 10 MG PO TABS: 10.0000 mg | ORAL_TABLET | Freq: Three times a day (TID) | ORAL | Status: DC

## 2014-12-24 MED ORDER — SENNA 8.6 MG PO TABS
1.0000 | ORAL_TABLET | Freq: Two times a day (BID) | ORAL | Status: DC
  Administered 2014-12-25 – 2014-12-27 (×5): 8.6 mg via ORAL
  Filled 2014-12-24 (×5): qty 1

## 2014-12-24 MED ORDER — FERROUS SULFATE 325 (65 FE) MG PO TABS
325.0000 mg | ORAL_TABLET | Freq: Three times a day (TID) | ORAL | Status: DC
  Administered 2014-12-25 – 2014-12-27 (×6): 325 mg via ORAL
  Filled 2014-12-24 (×7): qty 1

## 2014-12-24 MED ORDER — LISINOPRIL 40 MG PO TABS
40.0000 mg | ORAL_TABLET | Freq: Every day | ORAL | Status: DC
  Administered 2014-12-24 – 2014-12-27 (×4): 40 mg via ORAL
  Filled 2014-12-24 (×4): qty 1

## 2014-12-24 MED ORDER — ENOXAPARIN SODIUM 40 MG/0.4ML ~~LOC~~ SOLN
40.0000 mg | SUBCUTANEOUS | Status: DC
  Administered 2014-12-25 – 2014-12-27 (×3): 40 mg via SUBCUTANEOUS
  Filled 2014-12-24 (×3): qty 0.4

## 2014-12-24 MED ORDER — OXYCODONE HCL 5 MG PO TABS: 5.0000 mg | ORAL_TABLET | ORAL | Status: DC | PRN

## 2014-12-24 MED ORDER — SODIUM CHLORIDE 0.9 % IV SOLN
INTRAVENOUS | Status: DC
  Administered 2014-12-24: 08:00:00 via INTRAVENOUS

## 2014-12-24 MED ORDER — LIDOCAINE HCL (CARDIAC) 20 MG/ML IV SOLN
INTRAVENOUS | Status: DC | PRN
  Administered 2014-12-24: 50 mg via INTRAVENOUS

## 2014-12-24 MED ORDER — HYDROMORPHONE HCL 1 MG/ML IJ SOLN
INTRAMUSCULAR | Status: AC
  Filled 2014-12-24: qty 1

## 2014-12-24 MED ORDER — MAGNESIUM CITRATE PO SOLN: 1.0000 | Freq: Once | ORAL | Status: DC | PRN

## 2014-12-24 MED ORDER — POLYETHYLENE GLYCOL 3350 17 G PO PACK: 17.0000 g | PACK | Freq: Every day | ORAL | Status: DC | PRN

## 2014-12-24 MED ORDER — EPHEDRINE SULFATE 50 MG/ML IJ SOLN
INTRAMUSCULAR | Status: DC | PRN
  Administered 2014-12-24 (×2): 10 mg via INTRAVENOUS

## 2014-12-24 MED ORDER — OXYCODONE HCL 5 MG PO TABS
5.0000 mg | ORAL_TABLET | ORAL | Status: DC | PRN
  Administered 2014-12-25 (×3): 10 mg via ORAL
  Administered 2014-12-25: 5 mg via ORAL
  Administered 2014-12-26 – 2014-12-27 (×4): 10 mg via ORAL
  Filled 2014-12-24: qty 1
  Filled 2014-12-24 (×7): qty 2

## 2014-12-24 MED ORDER — OXYCODONE-ACETAMINOPHEN 5-325 MG PO TABS: 1.0000 | ORAL_TABLET | Freq: Four times a day (QID) | ORAL | Status: DC | PRN

## 2014-12-24 MED ORDER — LACTATED RINGERS IV SOLN
INTRAVENOUS | Status: DC | PRN
  Administered 2014-12-24 (×2): via INTRAVENOUS

## 2014-12-24 MED ORDER — SODIUM CHLORIDE 0.9 % IV SOLN
75.0000 mL/h | INTRAVENOUS | Status: DC
  Administered 2014-12-24 – 2014-12-26 (×2): 75 mL/h via INTRAVENOUS

## 2014-12-24 MED ORDER — OXYCODONE HCL 5 MG PO TABS: 5.0000 mg | ORAL_TABLET | Freq: Once | ORAL | Status: DC | PRN

## 2014-12-24 MED ORDER — MENTHOL 3 MG MT LOZG: 1.0000 | LOZENGE | OROMUCOSAL | Status: DC | PRN

## 2014-12-24 MED ORDER — 0.9 % SODIUM CHLORIDE (POUR BTL) OPTIME
TOPICAL | Status: DC | PRN
  Administered 2014-12-24: 1000 mL

## 2014-12-24 MED ORDER — ENOXAPARIN SODIUM 40 MG/0.4ML ~~LOC~~ SOLN: 40.0000 mg | SUBCUTANEOUS | Status: DC

## 2014-12-24 MED ORDER — MORPHINE SULFATE (PF) 2 MG/ML IV SOLN
2.0000 mg | INTRAVENOUS | Status: DC | PRN
  Administered 2014-12-27 (×2): 2 mg via INTRAVENOUS
  Filled 2014-12-24 (×2): qty 1

## 2014-12-24 MED ORDER — PROPOFOL 10 MG/ML IV BOLUS
INTRAVENOUS | Status: AC
  Filled 2014-12-24: qty 20

## 2014-12-24 MED ORDER — OXYCODONE HCL 5 MG/5ML PO SOLN: 5.0000 mg | Freq: Once | ORAL | Status: DC | PRN

## 2014-12-24 MED ORDER — HYDROMORPHONE HCL 1 MG/ML IJ SOLN
0.2500 mg | INTRAMUSCULAR | Status: DC | PRN
  Administered 2014-12-24 (×2): 0.5 mg via INTRAVENOUS

## 2014-12-24 MED ORDER — PANTOPRAZOLE SODIUM 40 MG PO TBEC
40.0000 mg | DELAYED_RELEASE_TABLET | Freq: Every day | ORAL | Status: DC
  Administered 2014-12-24 – 2014-12-27 (×4): 40 mg via ORAL
  Filled 2014-12-24 (×4): qty 1

## 2014-12-24 MED ORDER — ACETAMINOPHEN 325 MG PO TABS: 650.0000 mg | ORAL_TABLET | Freq: Four times a day (QID) | ORAL | Status: DC | PRN

## 2014-12-24 MED ORDER — FENTANYL CITRATE (PF) 250 MCG/5ML IJ SOLN
INTRAMUSCULAR | Status: DC | PRN
  Administered 2014-12-24 (×3): 50 ug via INTRAVENOUS
  Administered 2014-12-24: 100 ug via INTRAVENOUS

## 2014-12-24 MED ORDER — ONDANSETRON HCL 4 MG PO TABS: 4.0000 mg | ORAL_TABLET | Freq: Four times a day (QID) | ORAL | Status: DC | PRN

## 2014-12-24 MED ORDER — PHENOL 1.4 % MT LIQD: 1.0000 | OROMUCOSAL | Status: DC | PRN

## 2014-12-24 MED ORDER — ONDANSETRON HCL 4 MG/2ML IJ SOLN
4.0000 mg | Freq: Four times a day (QID) | INTRAMUSCULAR | Status: DC | PRN
  Administered 2014-12-24: 4 mg via INTRAVENOUS
  Filled 2014-12-24: qty 2

## 2014-12-24 MED ORDER — SUCCINYLCHOLINE CHLORIDE 20 MG/ML IJ SOLN
INTRAMUSCULAR | Status: DC | PRN
  Administered 2014-12-24: 100 mg via INTRAVENOUS

## 2014-12-24 MED ORDER — ACETAMINOPHEN 325 MG PO TABS
650.0000 mg | ORAL_TABLET | Freq: Four times a day (QID) | ORAL | Status: DC | PRN
Start: 2014-12-24 — End: 2014-12-27
  Administered 2014-12-25 (×2): 650 mg via ORAL
  Filled 2014-12-24 (×2): qty 2

## 2014-12-24 MED ORDER — CALCIUM CARBONATE-VITAMIN D 500-200 MG-UNIT PO TABS
1.0000 | ORAL_TABLET | Freq: Every day | ORAL | Status: DC
Start: 2014-12-24 — End: 2016-03-13

## 2014-12-24 MED ORDER — ALUM & MAG HYDROXIDE-SIMETH 200-200-20 MG/5ML PO SUSP: 30.0000 mL | Freq: Four times a day (QID) | ORAL | Status: DC | PRN

## 2014-12-24 MED ORDER — CEFAZOLIN SODIUM-DEXTROSE 2-3 GM-% IV SOLR
2.0000 g | Freq: Four times a day (QID) | INTRAVENOUS | Status: AC
  Administered 2014-12-25 (×2): 2 g via INTRAVENOUS
  Filled 2014-12-24 (×2): qty 50

## 2014-12-24 MED ORDER — ALUM & MAG HYDROXIDE-SIMETH 200-200-20 MG/5ML PO SUSP: 30.0000 mL | ORAL | Status: DC | PRN

## 2014-12-24 MED ORDER — FENTANYL CITRATE (PF) 250 MCG/5ML IJ SOLN
INTRAMUSCULAR | Status: AC
  Filled 2014-12-24: qty 5

## 2014-12-24 MED ORDER — LACTATED RINGERS IV SOLN
INTRAVENOUS | Status: DC
  Administered 2014-12-24: 16:00:00 via INTRAVENOUS

## 2014-12-24 MED ORDER — SENNA-DOCUSATE SODIUM 8.6-50 MG PO TABS: 2.0000 | ORAL_TABLET | Freq: Every day | ORAL | Status: DC

## 2014-12-24 MED ORDER — DOCUSATE SODIUM 100 MG PO CAPS
100.0000 mg | ORAL_CAPSULE | Freq: Two times a day (BID) | ORAL | Status: DC
  Administered 2014-12-25 – 2014-12-27 (×5): 100 mg via ORAL
  Filled 2014-12-24 (×5): qty 1

## 2014-12-24 MED ORDER — ATENOLOL 50 MG PO TABS
50.0000 mg | ORAL_TABLET | Freq: Every day | ORAL | Status: DC
  Filled 2014-12-24: qty 1

## 2014-12-24 MED ORDER — MORPHINE SULFATE (PF) 2 MG/ML IV SOLN
2.0000 mg | INTRAVENOUS | Status: DC | PRN
  Administered 2014-12-24: 2 mg via INTRAVENOUS
  Filled 2014-12-24: qty 1

## 2014-12-24 MED ORDER — BISACODYL 10 MG RE SUPP: 10.0000 mg | Freq: Every day | RECTAL | Status: DC | PRN

## 2014-12-24 MED ORDER — PROMETHAZINE HCL 25 MG/ML IJ SOLN: 6.2500 mg | INTRAMUSCULAR | Status: DC | PRN

## 2014-12-24 SURGICAL SUPPLY — 46 items
APL SKNCLS STERI-STRIP NONHPOA (GAUZE/BANDAGES/DRESSINGS) ×1
BENZOIN TINCTURE PRP APPL 2/3 (GAUZE/BANDAGES/DRESSINGS) ×3 IMPLANT
BLADE TFNA HELICAL 115 (Anchor) ×1 IMPLANT
BLADE TFNA HELICAL 115MM (Anchor) ×1 IMPLANT
BOOTCOVER CLEANROOM LRG (PROTECTIVE WEAR) ×2 IMPLANT
CLOSURE STERI-STRIP 1/2X4 (GAUZE/BANDAGES/DRESSINGS)
CLOSURE WOUND 1/2 X4 (GAUZE/BANDAGES/DRESSINGS) ×1
CLSR STERI-STRIP ANTIMIC 1/2X4 (GAUZE/BANDAGES/DRESSINGS) ×1 IMPLANT
COVER PERINEAL POST (MISCELLANEOUS) ×3 IMPLANT
COVER SURGICAL LIGHT HANDLE (MISCELLANEOUS) ×3 IMPLANT
DRAPE STERI IOBAN 125X83 (DRAPES) ×3 IMPLANT
DRSG MEPILEX BORDER 4X4 (GAUZE/BANDAGES/DRESSINGS) ×7 IMPLANT
DRSG MEPILEX BORDER 4X8 (GAUZE/BANDAGES/DRESSINGS) ×2 IMPLANT
DURAPREP 26ML APPLICATOR (WOUND CARE) ×3 IMPLANT
ELECT CAUTERY BLADE 6.4 (BLADE) ×3 IMPLANT
ELECT REM PT RETURN 9FT ADLT (ELECTROSURGICAL) ×3
ELECTRODE REM PT RTRN 9FT ADLT (ELECTROSURGICAL) ×1 IMPLANT
EVACUATOR 1/8 PVC DRAIN (DRAIN) IMPLANT
FACESHIELD WRAPAROUND (MASK) IMPLANT
FACESHIELD WRAPAROUND OR TEAM (MASK) ×2 IMPLANT
GAUZE XEROFORM 5X9 LF (GAUZE/BANDAGES/DRESSINGS) ×1 IMPLANT
GLOVE BIOGEL PI ORTHO PRO SZ8 (GLOVE) ×4
GLOVE ORTHO TXT STRL SZ7.5 (GLOVE) ×4 IMPLANT
GLOVE PI ORTHO PRO STRL SZ8 (GLOVE) ×2 IMPLANT
GLOVE SURG ORTHO 8.0 STRL STRW (GLOVE) ×4 IMPLANT
GOWN STRL REUS W/ TWL XL LVL3 (GOWN DISPOSABLE) ×1 IMPLANT
GOWN STRL REUS W/TWL 2XL LVL3 (GOWN DISPOSABLE) ×2 IMPLANT
GOWN STRL REUS W/TWL XL LVL3 (GOWN DISPOSABLE) ×6
GUIDEWIRE 3.2X400 (WIRE) ×2 IMPLANT
KIT ROOM TURNOVER OR (KITS) ×3 IMPLANT
LINER BOOT UNIVERSAL DISP (MISCELLANEOUS) ×1 IMPLANT
MANIFOLD NEPTUNE II (INSTRUMENTS) ×3 IMPLANT
NAIL TROCH FIX LNG 11X420LT (Nail) ×2 IMPLANT
NS IRRIG 1000ML POUR BTL (IV SOLUTION) ×3 IMPLANT
PACK GENERAL/GYN (CUSTOM PROCEDURE TRAY) ×3 IMPLANT
PAD ARMBOARD 7.5X6 YLW CONV (MISCELLANEOUS) ×6 IMPLANT
STAPLER VISISTAT 35W (STAPLE) ×1 IMPLANT
STRIP CLOSURE SKIN 1/2X4 (GAUZE/BANDAGES/DRESSINGS) ×1 IMPLANT
SUT VIC AB 0 CTB1 27 (SUTURE) ×3 IMPLANT
SUT VIC AB 2-0 FS1 27 (SUTURE) ×1 IMPLANT
SUT VIC AB 2-0 SH 27 (SUTURE) ×3
SUT VIC AB 2-0 SH 27XBRD (SUTURE) IMPLANT
SUT VIC AB 3-0 SH 8-18 (SUTURE) ×3 IMPLANT
TOWEL OR 17X24 6PK STRL BLUE (TOWEL DISPOSABLE) ×3 IMPLANT
TOWEL OR 17X26 10 PK STRL BLUE (TOWEL DISPOSABLE) ×3 IMPLANT
WATER STERILE IRR 1000ML POUR (IV SOLUTION) ×3 IMPLANT

## 2014-12-24 NOTE — H&P (Signed)
[facility] History and Physical  TOMAZ JANIS LOV:[id number] DOB: 1941/01/30 DOA: 12/24/2014  Referring physician:  PCP: Glo Herring., MD   Chief Complaint: Status post fall  HPI: JORDY VERBA is a 74 y.o. male with a past medical history of hypertension and diet-controlled diabetes mellitus who presents as a transfer from[facility] for further treatment of hip fracture. Mr. Klasen was in his usual state health until yesterday evening where he fell while getting out of his tractor-trailer. He reported it had been a rainy night . 2 use a restroom. Slipped on the wet railing while getting out of this truck landed on his left hip. He also reported injuring the back of his head. Bystanders called EMS. He was taken to outside hospital where CT scan of brain did not reveal intracranial hemorrhage. Hip x-ray revealed an acute intertrochanteric fracture of the proximal left femur. Staples were placed for his head laceration. Prior to this event patient reports being highly active, still working as a Administrator. He states his only medical history is diet-controlled diabetes and hypertension. I discussed case with Dr.Landau of orthopedic surgery who will evaluate patient this morning.                                                   Review of Systems:  Constitutional:  No weight loss, night sweats, Fevers, chills, fatigue.  HEENT:  No headaches, Difficulty swallowing,Tooth/dental problems,Sore throat,  No sneezing, itching, ear ache, nasal congestion, post nasal drip,  Cardio-vascular:  No chest pain, Orthopnea, PND, swelling in lower extremities, anasarca, dizziness, palpitations  GI:  No heartburn, indigestion, abdominal pain, nausea, vomiting, diarrhea, change in bowel habits, loss of appetite  Resp:  No shortness of breath with exertion or at rest. No excess mucus, no productive cough, No non-productive cough, No coughing up of blood.No change in color of mucus.No  wheezing.No chest wall deformity  Skin:  no rash or lesions.  GU:  no dysuria, change in color of urine, no urgency or frequency. No flank pain.  Musculoskeletal:  No joint pain or swelling. No decreased range of motion. No back pain. Positive for left hip pain Psych:  No change in mood or affect. No depression or anxiety. No memory loss.   Past Medical History  Diagnosis Date  . Hypertension    No past surgical history on file. Social History:  has no tobacco, alcohol, and drug history on file.  Allergies  Allergen Reactions  . Penicillins Itching   Family history Noncontributory   Prior to Admission medications   Medication Sig Start Date End Date Taking? Authorizing Provider  aspirin 81 MG tablet Take 81 mg by mouth daily.    Historical Provider, MD  atenolol (TENORMIN) 50 MG tablet Take 50 mg by mouth daily.    Historical Provider, MD  doxycycline (VIBRAMYCIN) 100 MG capsule Take 1 capsule (100 mg total) by mouth 2 (two) times daily. 07/17/12   Prentiss Bells, MD  lisinopril (PRINIVIL,ZESTRIL) 40 MG tablet Take 40 mg by mouth daily.    Historical Provider, MD   Physical Exam: Filed Vitals:   12/24/14 0702  BP: 165/79  Pulse: 57  Temp: 98.2 F (36.8 C)  TempSrc: Oral  Resp: 18  Height: 6\' 3"  (1.905 m)  Weight: 100.517 kg (221 lb 9.6 oz)  SpO2: 96%  Wt Readings from Last 3 Encounters:  12/24/14 100.517 kg (221 lb 9.6 oz)  07/17/12 97.523 kg (215 lb)    General:  Appears calm and comfortable, in no acute distress he is awake and alert Eyes: PERRL, normal lids, irises & conjunctiva ENT: grossly normal hearing, lips & tongue Neck: no LAD, masses or thyromegaly Cardiovascular: RRR, no m/r/g. No LE edema. Telemetry: SR, no arrhythmias  Respiratory: CTA bilaterally, no w/r/r. Normal respiratory effort. Abdomen: soft, ntnd Skin: no rash or induration seen on limited exam Musculoskeletal: grossly normal tone BUE/BLE, he has pain with passive and active movement  of his left lower extremity Psychiatric: grossly normal mood and affect, speech fluent and appropriate Neurologic: grossly non-focal.          Labs on Admission:  Basic Metabolic Panel: No results for input(s): NA, K, CL, CO2, GLUCOSE, BUN, CREATININE, CALCIUM, MG, PHOS in the last 168 hours. Liver Function Tests: No results for input(s): AST, ALT, ALKPHOS, BILITOT, PROT, ALBUMIN in the last 168 hours. No results for input(s): LIPASE, AMYLASE in the last 168 hours. No results for input(s): AMMONIA in the last 168 hours. CBC: No results for input(s): WBC, NEUTROABS, HGB, HCT, MCV, PLT in the last 168 hours. Cardiac Enzymes: No results for input(s): CKTOTAL, CKMB, CKMBINDEX, TROPONINI in the last 168 hours.  BNP (last 3 results) No results for input(s): BNP in the last 8760 hours.  ProBNP (last 3 results) No results for input(s): PROBNP in the last 8760 hours.  CBG: No results for input(s): GLUCAP in the last 168 hours.  Radiological Exams on Admission: No results found.  EKG: Pending  Assessment/Plan Principal Problem:   Intertrochanteric fracture of left hip Active Problems:   HTN (hypertension)   Hip fracture   1. Intertrochanteric hip fracture. Patient having a fall from his tractor-trailer overnight reported slipping on the wet railing as it was raining. He was taken to local hospital where imaging studies revealed acute fracture of proximal left intertrochanteric femur. Patient otherwise is highly functional and does not appear to have active medical issues at this time. Labs from outside facility were reviewed, he does have a white count of 14,000 which could be related to hip fracture. Otherwise IMA goes stable at 15.6 and hematocrit of 43.8. EKG shows sinus rhythm, creatinine 1.15 with BUN of 11. Patient likely at low operative risk. I discussed case with Dr.Landau of orthopedic surgery will likely take patient to the operating room today. 2. Hypertension. Patient had  blood pressure 165/79. Will continue his atenolol 50 mg by mouth daily and lisinopril 40 mg by mouth daily. 3. Diet-controlled diabetes mellitus. Will check a hemoglobin A1c. Had a glucose of 113. Will monitor. 4. DVT prophylaxis. SCDs for now as he will likely undergo orthopedic surgery this morning.  Code Status: Full code Family Communication: Spoke to his wife and adult children at bedside Disposition Plan: Anticipate he'll require greater than 2 nights hospitalization admit to inpatient service  Time spent: 65 min  Kelvin Cellar [facility] Pager [phone]

## 2014-12-24 NOTE — Transfer of Care (Signed)
Immediate Anesthesia Transfer of Care Note  Patient: Steven Boyd  Procedure(s) Performed: Procedure(s): INTRAMEDULLARY (IM) NAIL FEMORAL (Left)  Patient Location: PACU  Anesthesia Type:General  Level of Consciousness: sedated  Airway & Oxygen Therapy: Patient Spontanous Breathing and Patient connected to nasal cannula oxygen  Post-op Assessment: Report given to RN and Post -op Vital signs reviewed and stable  Post vital signs: Reviewed and stable  Last Vitals:  Filed Vitals:   12/24/14 1030  BP:   Pulse: 56  Temp:   Resp:     Complications: No apparent anesthesia complications

## 2014-12-24 NOTE — Consult Note (Signed)
  ORTHOPAEDIC CONSULTATION  REQUESTING PHYSICIAN: Kelvin Cellar, MD  Chief Complaint: left hip pain  HPI: Steven Boyd is a 74 y.o. male who complains of  acute left hip pain after a mechanical fall yesterday off of a truck. 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 He had a laceration on his head, that was treated in an outside emergency room, and he is still having persistent severe pain over the left hip. Unable to walk. Movement makes it worse. He was seen in outside emergency room, which did not have any orthopedic coverage, and he lives here in [place] and request was made for transfer to manage orthopedic care.  Past Medical History  Diagnosis Date  . Hypertension   . Diabetes mellitus without complication     diet controlled  . Hard of hearing     bilateral, no hearing aids   History reviewed. No pertinent past surgical history. Social History   Social History  . Marital Status: Married    Spouse Name: N/A  . Number of Children: N/A  . Years of Education: N/A   Social History Main Topics  . Smoking status: None  . Smokeless tobacco: None  . Alcohol Use: None  . Drug Use: None  . Sexual Activity: Not Asked   Other Topics Concern  . None   Social History Narrative   History reviewed. No pertinent family history. Allergies  Allergen Reactions  . Penicillins Itching     Positive ROS: All other systems have been reviewed and were otherwise negative with the exception of those mentioned in the HPI and as above.  Physical Exam: General: Alert, no acute distress Cardiovascular: No pedal edema Respiratory: No cyanosis, no use of accessory musculature GI: No organomegaly, abdomen is soft and non-tender Skin: No lesions in the area of chief complaint with the exception of some bruising over the left hip. Neurologic: Sensation intact distally Psychiatric: Patient is competent for consent with normal mood and affect Lymphatic: No axillary or cervical  lymphadenopathy  MUSCULOSKELETAL: Left hip has positive log roll. EHL and FHL are firing. Bilateral upper and lower extremities are atraumatic.  X-rays from an outside facility demonstrate a displaced left intertrochanteric hip fracture.  Assessment: Left displaced intertrochanteric hip fracture.  Plan: This is an acute severe injury which carries risk of both for morbidity and mortality, and I recommended surgical intervention in order to restore his preinjury level of function if at all possible.  The risks benefits and alternatives were discussed with the patient including but not limited to the risks of nonoperative treatment, versus surgical intervention including infection, bleeding, nerve injury, malunion, nonunion, the need for revision surgery, hardware prominence, hardware failure, the need for hardware removal, blood clots, cardiopulmonary complications, morbidity, mortality, among others, and they were willing to proceed.    We will plan for left hip intramedullary nail fixation.     Johnny Bridge, MD Cell [phone]   12/24/2014 9:57 AM

## 2014-12-24 NOTE — Anesthesia Preprocedure Evaluation (Addendum)
Anesthesia Evaluation  Patient identified by MRN, date of birth, ID band Patient awake    Reviewed: Allergy & Precautions, NPO status , Patient's Chart, lab work & pertinent test results, reviewed documented beta blocker date and time   Airway Mallampati: II  TM Distance: >3 FB Neck ROM: Full    Dental  (+) Edentulous Upper, Dental Advisory Given   Pulmonary neg pulmonary ROS,  breath sounds clear to auscultation        Cardiovascular hypertension, Pt. on medications and Pt. on home beta blockers Rhythm:Regular Rate:Normal + Systolic murmurs Low risk stress echo 02/2014. NL EF. No inducible ischemia. No significant valvular lesions   Neuro/Psych negative neurological ROS     GI/Hepatic negative GI ROS, Neg liver ROS,   Endo/Other  diabetes  Renal/GU negative Renal ROS     Musculoskeletal   Abdominal   Peds  Hematology negative hematology ROS (+)   Anesthesia Other Findings   Reproductive/Obstetrics                         Anesthesia Physical Anesthesia Plan  ASA: III  Anesthesia Plan: General   Post-op Pain Management:    Induction: Intravenous  Airway Management Planned: Oral ETT  Additional Equipment:   Intra-op Plan:   Post-operative Plan: Extubation in OR  Informed Consent: I have reviewed the patients History and Physical, chart, labs and discussed the procedure including the risks, benefits and alternatives for the proposed anesthesia with the patient or authorized representative who has indicated his/her understanding and acceptance.   Dental advisory given  Plan Discussed with: CRNA, Anesthesiologist and Surgeon  Anesthesia Plan Comments:       Anesthesia Quick Evaluation

## 2014-12-24 NOTE — Op Note (Signed)
DATE OF SURGERY:  12/24/2014  TIME: 6:48 PM  PATIENT NAME:  Steven Boyd  AGE: 74 y.o.  PRE-OPERATIVE DIAGNOSIS:  Left intertrochanteric hip fracture  POST-OPERATIVE DIAGNOSIS:  SAME  PROCEDURE:  INTRAMEDULLARY (IM) NAIL FEMORAL  SURGEON:  Rhylie Stehr P  ASSISTANT:  Joya Gaskins, OPA-C, present and scrubbed throughout the case, critical for assistance with exposure, retraction, instrumentation, and closure.  OPERATIVE IMPLANTS: Synthes trochanteric femoral nail with interlocking helical blade into the femoral head.  UNIQUE ASPECTS OF THE CASE:  The femoral neck was actually displaced posteriorly, which was an unusual pattern and somewhat difficult to achieve reduction, but ultimately I was able to get the intertrochanteric section back into alignment during the closed manipulation prior to prepping and draping. He was a 420 mm nail, and 916 mm helical blade which was fairly long, but I did need all of that length, and in fact with the 115 mm blade I was able to apply compression which left the blade just slightly prominent on the lateral aspect of the proximal femur, but I had excellent interdigitation and compression at the fracture site.  PREOPERATIVE INDICATIONS:  Steven Boyd is a 74 y.o. year old who fell and suffered a hip fracture. He was brought into the ER and then admitted and optimized and then elected for surgical intervention.    The risks benefits and alternatives were discussed with the patient including but not limited to the risks of nonoperative treatment, versus surgical intervention including infection, bleeding, nerve injury, malunion, nonunion, hardware prominence, hardware failure, need for hardware removal, blood clots, cardiopulmonary complications, morbidity, mortality, among others, and they were willing to proceed.    OPERATIVE PROCEDURE:  The patient was brought to the operating room and placed in the supine position. General anesthesia was  administered, with a foley. He was placed on the fracture table.  Closed reduction was performed under C-arm guidance. The length of the femur was also measured using fluoroscopy. Time out was then performed after sterile prep and drape. He received preoperative antibiotics.  Incision was made proximal to the greater trochanter. A guidewire was placed in the appropriate position. Confirmation was made on AP and lateral views. The above-named nail was opened. I opened the proximal femur with a reamer. I then placed the nail by hand easily down. I did not need to ream the femur.  Once the nail was completely seated, I placed a guidepin into the femoral head into the center center position. I measured the length, and then reamed the lateral cortex and up into the head. I then placed the helical blade. Slight compression was applied. Anatomic fixation achieved. Bone quality was mediocre.  I then secured the proximal interlocking bolt, and took off a half a turn, and then removed the instruments, and took final C-arm pictures AP and lateral the entire length of the leg. Anatomic reconstruction was achieved, and the wounds were irrigated copiously and closed with Vicryl followed by Steri-Strips and sterile gauze for the skin. The patient was awakened and returned to PACU in stable and satisfactory condition. There no complications and the patient tolerated the procedure well.  He will be weightbearing as tolerated, and will be on Lovenox  for a period of three weeks after discharge.   Marchia Bond, M.D.

## 2014-12-24 NOTE — Anesthesia Postprocedure Evaluation (Signed)
  Anesthesia Post-op Note  Patient: Steven Boyd  Procedure(s) Performed: Procedure(s): INTRAMEDULLARY (IM) NAIL FEMORAL (Left)  Patient Location: PACU  Anesthesia Type:General  Level of Consciousness: awake and alert   Airway and Oxygen Therapy: Patient Spontanous Breathing  Post-op Pain: none  Post-op Assessment: Post-op Vital signs reviewed LLE Motor Response: Purposeful movement LLE Sensation: Pain, Full sensation RLE Motor Response: Purposeful movement RLE Sensation: Full sensation      Post-op Vital Signs: Reviewed  Last Vitals:  Filed Vitals:   12/24/14 2000  BP:   Pulse: 54  Temp: 37 C  Resp: 12    Complications: No apparent anesthesia complications

## 2014-12-24 NOTE — Progress Notes (Signed)
Orthopedic Tech Progress Note Patient Details:  Steven Boyd 20-Jun-1940 [id number]  Ortho Devices Ortho Device/Splint Location: applied overhead frame Ortho Device/Splint Interventions: Ordered, Application   [name] 12/24/2014, 7:55 PM

## 2014-12-24 NOTE — Anesthesia Procedure Notes (Signed)
Procedure Name: Intubation Date/Time: 12/24/2014 5:27 PM Performed by: Maude Leriche D Pre-anesthesia Checklist: Patient identified, Emergency Drugs available, Suction available, Patient being monitored and Timeout performed Patient Re-evaluated:Patient Re-evaluated prior to inductionOxygen Delivery Method: Circle system utilized Preoxygenation: Pre-oxygenation with 100% oxygen Intubation Type: IV induction Ventilation: Mask ventilation without difficulty and Oral airway inserted - appropriate to patient size Laryngoscope Size: Miller and 2 Grade View: Grade I Tube type: Oral Tube size: 7.5 mm Number of attempts: 1 Airway Equipment and Method: Stylet Placement Confirmation: ETT inserted through vocal cords under direct vision,  positive ETCO2 and breath sounds checked- equal and bilateral Secured at: 22 cm Tube secured with: Tape Dental Injury: Teeth and Oropharynx as per pre-operative assessment

## 2014-12-24 NOTE — Telephone Encounter (Signed)
I was called by outside hospital to admit Steven Boyd who suffered a femoral fracture. He has a history of HTN and DM. He fell while he was getting out of his truck and had scalp laceration and him fracture. Dr.Landal from ortho will see patient upon arrival in am. Patient will be admitted by hospitalist once he arrives.

## 2014-12-24 NOTE — Care Management Note (Addendum)
Case Management Note  Patient Details  Name: Steven Boyd MRN: [id number] Date of Birth: March 22, 1941  Subjective/Objective:   acute left hip pain after a mechanical fall                  Action/Plan: NCM spoke to pt's wife, Daishaun Ayre # [phone] or dtr, Cathie Olden # [phone], pt is getting ready for surgery. Reported that accident happened while he was at work. NCM explained that pt with have to speak to his employer directly to initiate a claim for worker's comp. 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 She will give a call asap.   Expected Discharge Date:                  Expected Discharge Plan:  [place]  In-House Referral:     Discharge planning Services  CM Consult   Status of Service:  In process, will continue to follow  Medicare Important Message Given:    Date Medicare IM Given:    Medicare IM give by:    Date Additional Medicare IM Given:    Additional Medicare Important Message give by:     If discussed at [place] of Stay Meetings, dates discussed:    Additional Comments: NCM will continue to follow up for discharge needs.  Erenest Rasher, RN 12/24/2014, 2:11 PM

## 2014-12-24 NOTE — Discharge Instructions (Signed)

## 2014-12-25 DIAGNOSIS — I10 Essential (primary) hypertension: Secondary | ICD-10-CM

## 2014-12-25 DIAGNOSIS — H9190 Unspecified hearing loss, unspecified ear: Secondary | ICD-10-CM

## 2014-12-25 DIAGNOSIS — S72142A Displaced intertrochanteric fracture of left femur, initial encounter for closed fracture: Principal | ICD-10-CM

## 2014-12-25 LAB — BASIC METABOLIC PANEL
Anion gap: 5 (ref 5–15)
BUN: 10 mg/dL (ref 6–20)
CALCIUM: 8.1 mg/dL — AB (ref 8.9–10.3)
CO2: 24 mmol/L (ref 22–32)
CREATININE: 0.99 mg/dL (ref 0.61–1.24)
Chloride: 105 mmol/L (ref 101–111)
GLUCOSE: 113 mg/dL — AB (ref 65–99)
Potassium: 3.5 mmol/L (ref 3.5–5.1)
Sodium: 134 mmol/L — ABNORMAL LOW (ref 135–145)

## 2014-12-25 LAB — CBC
HEMATOCRIT: 36.8 % — AB (ref 39.0–52.0)
Hemoglobin: 12.6 g/dL — ABNORMAL LOW (ref 13.0–17.0)
MCH: 31.6 pg (ref 26.0–34.0)
MCHC: 34.2 g/dL (ref 30.0–36.0)
MCV: 92.2 fL (ref 78.0–100.0)
PLATELETS: 114 10*3/uL — AB (ref 150–400)
RBC: 3.99 MIL/uL — ABNORMAL LOW (ref 4.22–5.81)
RDW: 13 % (ref 11.5–15.5)
WBC: 8.4 10*3/uL (ref 4.0–10.5)

## 2014-12-25 NOTE — Evaluation (Signed)
Physical Therapy Evaluation Patient Details Name: Steven Boyd MRN: [id number] DOB: July 14, 1940 Today's Date: 12/25/2014   History of Present Illness  Admitted after fall from tractor trailer resulting in L femur intertrochanteric fx, scalp lac, and pt also reports shoulder pain L ( has a past medical history of Hypertension; Diabetes mellitus without complication; and Hard of hearing.)  Clinical Impression  Patient is s/p above surgery resulting in functional limitations due to the deficits listed below (see PT Problem List).  Patient will benefit from skilled PT to increase their independence and safety with mobility to allow discharge to the venue listed below.       Follow Up Recommendations Home health PT    Equipment Recommendations  Rolling walker with 5" wheels    Recommendations for Other Services       Precautions / Restrictions Precautions Precautions: Fall Restrictions Weight Bearing Restrictions: Yes LLE Weight Bearing: Weight bearing as tolerated      Mobility  Bed Mobility Overal bed mobility: Needs Assistance Bed Mobility: Supine to Sit     Supine to sit: Mod assist     General bed mobility comments: Cues for technique; heavy mod assist to elevate trunk to sitting; cues to preposition LEs toward side of bed  Transfers Overall transfer level: Needs assistance Equipment used: Rolling walker (2 wheeled) Transfers: Sit to/from Stand Sit to Stand: +2 safety/equipment;Mod assist         General transfer comment: Heavy mod assist to power up; minimal weight on LLE; cues for hand placement and safety; noted pt was quite anxious about moving his hands from RW to recliner in prep for sitting  Ambulation/Gait Ambulation/Gait assistance: Min assist;+2 safety/equipment (chair push) Ambulation Distance (Feet): 6 Feet (in addition to pivot steps) Assistive device: Rolling walker (2 wheeled) Gait Pattern/deviations: Step-to pattern     General Gait Details:  Cues for gait sequence and to bear weight into RW to unweigh painful LLE in stance; min assist mostly for RW advancement  Stairs            Wheelchair Mobility    Modified Rankin (Stroke Patients Only)       Balance Overall balance assessment: Needs assistance         Standing balance support: Bilateral upper extremity supported Standing balance-Leahy Scale: Poor                               Pertinent Vitals/Pain Pain Assessment: 0-10 Pain Score: 5  Pain Location: L hip at end of session Pain Descriptors / Indicators: Aching Pain Intervention(s): Limited activity within patient's tolerance;Repositioned    Home Living Family/patient expects to be discharged to:: Private residence Living Arrangements: Spouse/significant other Available Help at Discharge: Family;Available 24 hours/day Type of Home: House Home Access: Stairs to enter Entrance Stairs-Rails: Left Entrance Stairs-Number of Steps: 4 Home Layout: One level Home Equipment: Bedside commode      Prior Function Level of Independence: Independent               Hand Dominance        Extremity/Trunk Assessment   Upper Extremity Assessment: Defer to OT evaluation (L shoulder discomfort with movement; Will notify MD)           Lower Extremity Assessment: LLE deficits/detail   LLE Deficits / Details: Decr AROM and muscle activiation, limited by pain post op; Good quad activation  Cervical / Trunk Assessment: Normal  Communication  Communication: HOH  Cognition Arousal/Alertness: Awake/alert Behavior During Therapy: WFL for tasks assessed/performed Overall Cognitive Status: Within Functional Limits for tasks assessed                      General Comments General comments (skin integrity, edema, etc.): reported pain L shoulder when initiated getting up and out of bed; Able to bear weight through LUE on RW without significant increase in pain; Suspect soft tissue  injury    Exercises General Exercises - Lower Extremity Quad Sets: AROM;Both;5 reps Heel Slides: AAROM;Left;5 reps      Assessment/Plan    PT Assessment Patient needs continued PT services  PT Diagnosis Difficulty walking;Acute pain   PT Problem List Decreased strength;Decreased range of motion;Decreased activity tolerance;Decreased balance;Decreased mobility;Decreased knowledge of use of DME;Decreased knowledge of precautions;Pain  PT Treatment Interventions DME instruction;Gait training;Stair training;Functional mobility training;Therapeutic activities;Therapeutic exercise;Balance training;Patient/family education   PT Goals (Current goals can be found in the Care Plan section) Acute Rehab PT Goals Patient Stated Goal: walk PT Goal Formulation: With patient Time For Goal Achievement: 01/08/15 Potential to Achieve Goals: Good    Frequency Min 6X/week   Barriers to discharge        Co-evaluation               End of Session Equipment Utilized During Treatment: Gait belt Activity Tolerance: Patient tolerated treatment well Patient left: in chair;with call bell/phone within reach;with family/visitor present Nurse Communication: Mobility status         Time: 9983-3825 PT Time Calculation (min) (ACUTE ONLY): 14 min   Charges:   PT Evaluation $Initial PT Evaluation Tier I: 1 Procedure     PT G CodesQuin Hoop 12/25/2014, 9:26 AM Roney Marion, [place] Pager [phone] Office [phone]

## 2014-12-25 NOTE — Progress Notes (Signed)
Subjective: 1 Day Post-Op Procedure(s) (LRB): INTRAMEDULLARY (IM) NAIL FEMORAL (Left) Patient reports pain as moderate.  Patient does appear fairly lethargic. No nausea/vomiting, chest pain/sob, lightheadedness/dizziness.  Objective: Vital signs in last 24 hours: Temp:  [97.7 F (36.5 C)-99.3 F (37.4 C)] 99.3 F (37.4 C) (09/04 0447) Pulse Rate:  [53-70] 66 (09/04 0447) Resp:  [12-25] 16 (09/04 0447) BP: (136-162)/(63-80) 144/76 mmHg (09/04 0447) SpO2:  [93 %-99 %] 93 % (09/04 0447)  Intake/Output from previous day: 09/03 0701 - 09/04 0700 In: 1806.3 [P.O.:120; I.V.:1686.3] Out: 650 [Urine:500; Blood:150] Intake/Output this shift:     Recent Labs  12/24/14 0912 12/25/14 0745  HGB 14.2 12.6*    Recent Labs  12/24/14 0912 12/25/14 0745  WBC 8.4 8.4  RBC 4.63 3.99*  HCT 41.8 36.8*  PLT 128* 114*    Recent Labs  12/24/14 0912 12/25/14 0745  NA 137 134*  K 3.8 3.5  CL 105 105  CO2 27 24  BUN 10 10  CREATININE 0.97 0.99  GLUCOSE 135* 113*  CALCIUM 8.7* 8.1*   No results for input(s): LABPT, INR in the last 72 hours.  Neurologically intact Neurovascular intact Sensation intact distally Intact pulses distally Dorsiflexion/Plantar flexion intact Incision: scant drainage No cellulitis present Compartment soft  Assessment/Plan: 1 Day Post-Op Procedure(s) (LRB): INTRAMEDULLARY (IM) NAIL FEMORAL (Left) Advance diet Up with therapy  WBAT LLE ABLA- mild and stable Dry dressing change prn Continue plan per medicine  Fannie Knee 12/25/2014, 10:03 AM

## 2014-12-25 NOTE — Progress Notes (Signed)
Utilization Review Completed.Steven Boyd T9/07/2014  

## 2014-12-25 NOTE — Progress Notes (Signed)
PROGRESS NOTE  Steven Boyd BLT:[id number] DOB: 03-17-1941 DOA: 12/24/2014 PCP: Glo Herring., MD  HPI/Recap of past 24 hours:  Sitting in chair, reported less pain, multiple visitor in room  Assessment/Plan: Principal Problem:   Intertrochanteric fracture of left hip Active Problems:   HTN (hypertension)  1. Intertrochanteric hip fracture, s/p IMN to left hip on 9/3, pt/ot, on lovenox for DVT prophylaxis, ortho following 2. Hypertension. continue lisinopril 40 mg by mouth daily. Atenolol held due to bradycardia, will monitor 3. Diet-controlled diabetes mellitus.  A1c pending. Am fasting glucose of 113. Diet control for now 4. DVT prophylaxis. lovenox  Code Status: Full code Family Communication: patient and multiple family member in room  Disposition Plan: pending pt/ot   Consultants:  orthopedic  Procedures:  IMN to left femoral on 9/3  Antibiotics:  none   Objective: BP 144/76 mmHg  Pulse 66  Temp(Src) 98.7 F (37.1 C) (Oral)  Resp 16  Ht 6\' 3"  (1.905 m)  Wt 221 lb 9.6 oz (100.517 kg)  BMI 27.70 kg/m2  SpO2 93%  Intake/Output Summary (Last 24 hours) at 12/25/14 1546 Last data filed at 12/25/14 0700  Gross per 24 hour  Intake 1806.25 ml  Output    650 ml  Net 1156.25 ml   Filed Weights   12/24/14 0702  Weight: 221 lb 9.6 oz (100.517 kg)    Exam:   General:  NAD  Cardiovascular: RRR  Respiratory: CTABL  Abdomen: Soft/ND/NT, positive BS  Musculoskeletal: No Edema, post op changes left hip  Neuro: aaox3, baseline hard of hearing  Data Reviewed: Basic Metabolic Panel:  Recent Labs Lab 12/24/14 0912 12/25/14 0745  NA 137 134*  K 3.8 3.5  CL 105 105  CO2 27 24  GLUCOSE 135* 113*  BUN 10 10  CREATININE 0.97 0.99  CALCIUM 8.7* 8.1*   Liver Function Tests:  Recent Labs Lab 12/24/14 0912  AST 28  ALT 23  ALKPHOS 72  BILITOT 1.5*  PROT 6.5  ALBUMIN 3.6   No results for input(s): LIPASE, AMYLASE in the last 168  hours. No results for input(s): AMMONIA in the last 168 hours. CBC:  Recent Labs Lab 12/24/14 0912 12/25/14 0745  WBC 8.4 8.4  HGB 14.2 12.6*  HCT 41.8 36.8*  MCV 90.3 92.2  PLT 128* 114*   Cardiac Enzymes:   No results for input(s): CKTOTAL, CKMB, CKMBINDEX, TROPONINI in the last 168 hours. BNP (last 3 results) No results for input(s): BNP in the last 8760 hours.  ProBNP (last 3 results) No results for input(s): PROBNP in the last 8760 hours.  CBG:  Recent Labs Lab 12/24/14 1515  GLUCAP 106*    Recent Results (from the past 240 hour(s))  Surgical pcr screen     Status: Abnormal   Collection Time: 12/24/14  8:58 AM  Result Value Ref Range Status   MRSA, PCR NEGATIVE NEGATIVE Final   Staphylococcus aureus POSITIVE (A) NEGATIVE Final    Comment:        The Xpert SA Assay (FDA approved for NASAL specimens in patients over 54 years of age), is one component of a comprehensive surveillance program.  Test performance has been validated by [facility] for patients greater than or equal to 32 year old. It is not intended to diagnose infection nor to guide or monitor treatment.      Studies: Dg C-arm 1-60 Min  12/24/2014   CLINICAL DATA:  Left femur fracture.  EXAM: LEFT FEMUR 2 VIEWS; DG  C-ARM 61-120 MIN  COMPARISON:  None.  FINDINGS: Five C-arm views of the left femur demonstrate intramedullary rod and nail fixation of an intertrochanteric fracture. Anatomic position and alignment on these views.  IMPRESSION: Hardware fixation of a left intertrochanteric fracture.   Electronically Signed   By: Claudie Revering M.D.   On: 12/24/2014 19:40   Dg Hip Port Unilat With Pelvis 1v Left  12/24/2014   CLINICAL DATA:  ORIF left hip fracture.  EXAM: DG HIP (WITH OR WITHOUT PELVIS) 1V PORT LEFT  COMPARISON:  12/24/2014 intraoperative views.  FINDINGS: Intra medullary nail and delayed are noted traversing a left intertrochanteric femur fracture in near-anatomic alignment and position. No  complicating features are identified.  IMPRESSION: ORIF left intertrochanteric femur fracture in near-anatomic alignment and position.   Electronically Signed   By: Margarette Canada M.D.   On: 12/24/2014 20:34   Dg Femur Min 2 Views Left  12/24/2014   CLINICAL DATA:  Left femur fracture.  EXAM: LEFT FEMUR 2 VIEWS; DG C-ARM 61-120 MIN  COMPARISON:  None.  FINDINGS: Five C-arm views of the left femur demonstrate intramedullary rod and nail fixation of an intertrochanteric fracture. Anatomic position and alignment on these views.  IMPRESSION: Hardware fixation of a left intertrochanteric fracture.   Electronically Signed   By: Claudie Revering M.D.   On: 12/24/2014 19:40    Scheduled Meds: . docusate sodium  100 mg Oral BID  . enoxaparin (LOVENOX) injection  40 mg Subcutaneous Q24H  . ferrous sulfate  325 mg Oral TID PC  . lisinopril  40 mg Oral Daily  . mupirocin ointment   Nasal BID  . pantoprazole  40 mg Oral Daily  . senna  1 tablet Oral BID    Continuous Infusions: . sodium chloride 75 mL/hr (12/24/14 2131)     Time spent: 37mins  [name] MD, PhD  [facility] Pager [phone]. If 7PM-7AM, please contact night-coverage at [email or web address], password [facility] 12/25/2014, 3:46 PM  LOS: 1 day

## 2014-12-26 LAB — MAGNESIUM: MAGNESIUM: 1.8 mg/dL (ref 1.7–2.4)

## 2014-12-26 LAB — COMPREHENSIVE METABOLIC PANEL
ALBUMIN: 2.7 g/dL — AB (ref 3.5–5.0)
ALT: 16 U/L — ABNORMAL LOW (ref 17–63)
ANION GAP: 6 (ref 5–15)
AST: 24 U/L (ref 15–41)
Alkaline Phosphatase: 52 U/L (ref 38–126)
BUN: 8 mg/dL (ref 6–20)
CHLORIDE: 105 mmol/L (ref 101–111)
CO2: 27 mmol/L (ref 22–32)
Calcium: 8.2 mg/dL — ABNORMAL LOW (ref 8.9–10.3)
Creatinine, Ser: 0.98 mg/dL (ref 0.61–1.24)
GFR calc Af Amer: >60 mL/min (ref >60)
GFR calc non Af Amer: >60 mL/min (ref >60)
GLUCOSE: 111 mg/dL — AB (ref 65–99)
POTASSIUM: 3.7 mmol/L (ref 3.5–5.1)
Sodium: 138 mmol/L (ref 135–145)
Total Bilirubin: 0.9 mg/dL (ref 0.3–1.2)
Total Protein: 5.4 g/dL — ABNORMAL LOW (ref 6.5–8.1)

## 2014-12-26 LAB — URINE MICROSCOPIC-ADD ON

## 2014-12-26 LAB — CBC
HCT: 33 % — ABNORMAL LOW (ref 39.0–52.0)
Hemoglobin: 11.1 g/dL — ABNORMAL LOW (ref 13.0–17.0)
MCH: 30.7 pg (ref 26.0–34.0)
MCHC: 33.6 g/dL (ref 30.0–36.0)
MCV: 91.4 fL (ref 78.0–100.0)
PLATELETS: 99 10*3/uL — AB (ref 150–400)
RBC: 3.61 MIL/uL — ABNORMAL LOW (ref 4.22–5.81)
RDW: 12.9 % (ref 11.5–15.5)
WBC: 7.2 10*3/uL (ref 4.0–10.5)

## 2014-12-26 LAB — URINALYSIS, ROUTINE W REFLEX MICROSCOPIC
BILIRUBIN URINE: NEGATIVE
Glucose, UA: NEGATIVE mg/dL
KETONES UR: NEGATIVE mg/dL
LEUKOCYTES UA: NEGATIVE
NITRITE: NEGATIVE
PH: 6 (ref 5.0–8.0)
Protein, ur: NEGATIVE mg/dL
SPECIFIC GRAVITY, URINE: 1.018 (ref 1.005–1.030)
UROBILINOGEN UA: 1 mg/dL (ref 0.0–1.0)

## 2014-12-26 MED ORDER — AMLODIPINE BESYLATE 5 MG PO TABS
5.0000 mg | ORAL_TABLET | Freq: Every day | ORAL | Status: DC
  Administered 2014-12-26 – 2014-12-27 (×2): 5 mg via ORAL
  Filled 2014-12-26 (×2): qty 1

## 2014-12-26 NOTE — Evaluation (Signed)
Occupational Therapy Evaluation Patient Details Name: Steven Boyd MRN: [id number] DOB: 1941-02-25 Today's Date: 12/26/2014    History of Present Illness Admitted after fall from tractor trailer resulting in L femur intertrochanteric fx, scalp lac, and pt also reports shoulder pain L   Clinical Impression   Pt admitted for above diagnosis and has functional deficits listed below. Prior to hospitalization pt was independent in all ADLs and functional mobility. Pts current status is min > mod A for sit to stand, min A for functional mobility, and mod A for LB ADLs. Pt to be d/c home with wife to provide 24 hour supervision/assist and HHOT. Pt would benefit from skilled OT services to improve independence in ADL activities through education and demonstration of AE and to improve safety and functional mobility required for participation in ADLs.     Follow Up Recommendations  Home health OT;Supervision/Assistance - 24 hour    Equipment Recommendations  3 in 1 bedside comode;Other (comment) (AE: reacher, sock aid, long handled sponge, lh shoe horn)    Recommendations for Other Services  None at this time     Precautions / Restrictions Precautions Precautions: Fall Restrictions Weight Bearing Restrictions: Yes LLE Weight Bearing: Weight bearing as tolerated      Mobility Bed Mobility Overal bed mobility: Needs Assistance Bed Mobility: Supine to Sit     Supine to sit: Mod assist;HOB elevated     General bed mobility comments: assits with LLE and trunk with initial transition to sitting.   Transfers Overall transfer level: Needs assistance Equipment used: Rolling walker (2 wheeled);1 person hand held assist Transfers: Sit to/from Stand Sit to Stand: Mod assist Stand pivot transfers: Min assist       General transfer comment: cues for hand placement with standing and sitting    Balance Overall balance assessment: Needs assistance Sitting-balance support: No upper  extremity supported Sitting balance-Leahy Scale: Good     Standing balance support: Bilateral upper extremity supported Standing balance-Leahy Scale: Poor       ADL Overall ADL's : Needs assistance/impaired     Grooming: Set up;Sitting   Upper Body Bathing: Min guard;Supervision/ safety   Lower Body Bathing: Moderate assistance Lower Body Bathing Details (indicate cue type and reason): pt with difficulty reaching to feet and crossing legs to reach feet Upper Body Dressing : Min guard;Supervision/safety   Lower Body Dressing: Moderate assistance Lower Body Dressing Details (indicate cue type and reason): pt with difficulty reaching down to feet and crossing legs to reach feet  Toilet Transfer: Moderate assistance;Minimal assistance;Stand-pivot;Cueing for safety;Cueing for sequencing;BSC;RW   Toileting- Clothing Manipulation and Hygiene: Minimal assistance   Tub/ Shower Transfer: Moderate assistance   Functional mobility during ADLs: Minimal assistance General ADL Comments: Pt with good UE ROM and MMT needed for UB ADLs, pt unable to reach feet for LB ADLs (mod A required). Wife able to assist with ADLs upon d/c home, pt and wife interested in AE for increased independence in ADL axs.               Pertinent Vitals/Pain Pain Assessment: 0-10 Pain Score: 5  Pain Location: Lt hip Pain Descriptors / Indicators: Aching Pain Intervention(s): Limited activity within patient's tolerance;Monitored during session     Hand Dominance Right   Extremity/Trunk Assessment Upper Extremity Assessment Upper Extremity Assessment: Overall WFL for tasks assessed (shoulder FF ROM and MMT WFL)   Lower Extremity Assessment Lower Extremity Assessment: Defer to PT evaluation   Cervical / Trunk Assessment Cervical /  Trunk Assessment: Normal   Communication Communication Communication: HOH   Cognition Arousal/Alertness: Awake/alert Behavior During Therapy: WFL for tasks  assessed/performed Overall Cognitive Status: Within Functional Limits for tasks assessed                     Home Living Family/patient expects to be discharged to:: Private residence Living Arrangements: Spouse/significant other Available Help at Discharge: Family;Available 24 hours/day Type of Home: House Home Access: Stairs to enter [employer] of Steps: 4 Entrance Stairs-Rails: Left Home Layout: One level     Bathroom Shower/Tub: Occupational psychologist: Standard     Home Equipment: Bedside commode;Shower seat - built in (3 )   Additional Comments: BSC is ~72 years old, was his parents      Prior Functioning/Environment Level of Independence: Independent             OT Diagnosis: Generalized weakness;Acute pain   OT Problem List: Decreased strength;Decreased activity tolerance;Impaired balance (sitting and/or standing);Decreased safety awareness;Decreased knowledge of use of DME or AE   OT Treatment/Interventions: Self-care/ADL training;DME and/or AE instruction;Patient/family education    OT Goals(Current goals can be found in the care plan section) Acute Rehab OT Goals Patient Stated Goal: take a shower OT Goal Formulation: With patient Time For Goal Achievement: 01/09/15 Potential to Achieve Goals: Good ADL Goals Pt Will Perform Lower Body Bathing: with supervision;with adaptive equipment Pt Will Perform Lower Body Dressing: with supervision;with adaptive equipment Pt Will Transfer to Toilet: with supervision;stand pivot transfer;bedside commode Pt Will Perform Tub/Shower Transfer: with supervision;Stand pivot transfer;3 in 1;rolling walker  OT Frequency: Min 2X/week    End of Session Equipment Utilized During Treatment: Rolling walker;Other (comment) (3 in 1) Nurse Communication: Mobility status (notifed RN that pt was able to tsf to [facility] with mod A )  Activity Tolerance: Patient limited by pain Patient left: in chair;with call  bell/phone within reach;with nursing/sitter in room;with family/visitor present   Time: 1423-9532 OT Time Calculation (min): 36 min Charges:  OT General Charges $OT Visit: 1 Procedure OT Evaluation $Initial OT Evaluation Tier I: 1 Procedure OT Treatments $Self Care/Home Management : 8-22 mins  Binnie Kand, MS, OTR/L 12/26/2014, 11:14 AM [phone]

## 2014-12-26 NOTE — Progress Notes (Signed)
Subjective: 2 Days Post-Op Procedure(s) (LRB): INTRAMEDULLARY (IM) NAIL FEMORAL (Left) Patient reports pain as moderate.  No nausea/vomiting, lightheadedness/dizziness, chest pain/sob.  Positive flatus but no bm.  Patient has increased appetite since yesterday.  Objective: Vital signs in last 24 hours: Temp:  [98.7 F (37.1 C)-100.3 F (37.9 C)] 98.9 F (37.2 C) (09/05 0503) Pulse Rate:  [59-88] 62 (09/05 0656) Resp:  [16-18] 17 (09/05 0503) BP: (141-187)/(70-86) 153/70 mmHg (09/05 0656) SpO2:  [92 %-95 %] 92 % (09/05 0503)  Intake/Output from previous day:   Intake/Output this shift:     Recent Labs  12/24/14 0912 12/25/14 0745 12/26/14 0424  HGB 14.2 12.6* 11.1*    Recent Labs  12/25/14 0745 12/26/14 0424  WBC 8.4 7.2  RBC 3.99* 3.61*  HCT 36.8* 33.0*  PLT 114* 99*    Recent Labs  12/25/14 0745 12/26/14 0424  NA 134* 138  K 3.5 3.7  CL 105 105  CO2 24 27  BUN 10 8  CREATININE 0.99 0.98  GLUCOSE 113* 111*  CALCIUM 8.1* 8.2*   No results for input(s): LABPT, INR in the last 72 hours.  Neurologically intact Neurovascular intact Sensation intact distally Intact pulses distally Dorsiflexion/Plantar flexion intact Compartment soft  Negative homans bilaterally Scant drainage noted through both dressings  Assessment/Plan: 2 Days Post-Op Procedure(s) (LRB): INTRAMEDULLARY (IM) NAIL FEMORAL (Left) Advance diet Up with therapy  WBAT LLE ABLA- mild and stable Continue Lovenox for DVT ppx Dry dressing change prn Continue plan per medicine  Fannie Knee 12/26/2014, 8:31 AM

## 2014-12-26 NOTE — Progress Notes (Signed)
Physical Therapy Treatment Patient Details Name: Steven Boyd MRN: [id number] DOB: 05/24/1940 Today's Date: 12/26/2014    History of Present Illness Admitted after fall from tractor trailer resulting in L femur intertrochanteric fx, scalp lac, and pt also reports shoulder pain L    PT Comments    Patient making gradual progress with mobility. Consistent encouragement needed during session for decreasing external support and increasing ambulation weightbearing and distance. Patient reporting that he is planning to go home but is nervous about the stairs. Began discussing need to improve mobility for anticipated D/C to home.   Follow Up Recommendations  Home health PT     Equipment Recommendations  Rolling walker with 5" wheels    Recommendations for Other Services       Precautions / Restrictions Precautions Precautions: Fall Restrictions Weight Bearing Restrictions: Yes LLE Weight Bearing: Weight bearing as tolerated    Mobility  Bed Mobility Overal bed mobility: Needs Assistance Bed Mobility: Supine to Sit     Supine to sit: Mod assist;HOB elevated     General bed mobility comments: assits with LLE and trunk with initial transition to sitting.   Transfers Overall transfer level: Needs assistance Equipment used: Rolling walker (2 wheeled);1 person hand held assist Transfers: Sit to/from Stand Sit to Stand: Mod assist Stand pivot transfers: Min assist       General transfer comment: cues for hand placement with standing and sitting  Ambulation/Gait Ambulation/Gait assistance: Min assist Ambulation Distance (Feet): 15 Feet Assistive device: Rolling walker (2 wheeled) Gait Pattern/deviations: Step-to pattern;Decreased stance time - left;Decreased weight shift to left Gait velocity: decreased   General Gait Details: encouraging weighbearing on LLE and posture throughout ambulation   Stairs            Wheelchair Mobility    Modified Rankin (Stroke  Patients Only)       Balance Overall balance assessment: Needs assistance Sitting-balance support: No upper extremity supported Sitting balance-Leahy Scale: Good     Standing balance support: Bilateral upper extremity supported Standing balance-Leahy Scale: Poor                      Cognition Arousal/Alertness: Awake/alert Behavior During Therapy: WFL for tasks assessed/performed Overall Cognitive Status: Within Functional Limits for tasks assessed                      Exercises      General Comments        Pertinent Vitals/Pain Pain Assessment: 0-10 Pain Score: 5  Pain Location: Lt hip Pain Descriptors / Indicators: Aching Pain Intervention(s): Limited activity within patient's tolerance;Monitored during session    Home Living Family/patient expects to be discharged to:: Private residence Living Arrangements: Spouse/significant other Available Help at Discharge: Family;Available 24 hours/day Type of Home: House Home Access: Stairs to enter Entrance Stairs-Rails: Left Home Layout: One level Home Equipment: Bedside commode;Shower seat - built in (3 ) Additional Comments: BSC is ~72 years old, was his parents    Prior Function Level of Independence: Independent          PT Goals (current goals can now be found in the care plan section) Acute Rehab PT Goals Patient Stated Goal: take a shower PT Goal Formulation: With patient Time For Goal Achievement: 01/08/15 Potential to Achieve Goals: Good Progress towards PT goals: Progressing toward goals    Frequency  Min 6X/week    PT Plan Current plan remains appropriate    Co-evaluation  End of Session Equipment Utilized During Treatment: Gait belt Activity Tolerance: Patient limited by fatigue;Other (comment) (reports some increased hip pain) Patient left: in chair;Other (comment);with family/visitor present (in care of OT)     Time: 1438-8875 PT Time Calculation (min)  (ACUTE ONLY): 26 min  Charges:  $Gait Training: 8-22 mins $Therapeutic Activity: 8-22 mins                    G Codes:      Cassell Clement, PT, CSCS Pager [phone] Office [phone]  12/26/2014, 9:53 AM

## 2014-12-26 NOTE — Progress Notes (Signed)
PROGRESS NOTE  Steven Boyd DJM:[id number] DOB: 1940/05/25 DOA: 12/24/2014 PCP: Glo Herring., MD  HPI/Recap of past 24 hours:  bp elevated, tmax 100.3, no new complaints, wife in room  Assessment/Plan: Principal Problem:   Intertrochanteric fracture of left hip Active Problems:   HTN (hypertension)  1. Intertrochanteric hip fracture, s/p IMN to left hip on 9/3, pt/ot, on lovenox for DVT prophylaxis, ortho following, discharge planning 2. Hypertension. continue lisinopril 40 mg by mouth daily. Atenolol held due to bradycardia, start novasc for bp control 3. Diet-controlled diabetes mellitus.  A1c pending. Am fasting glucose of 113. Diet control for now 4. DVT prophylaxis. lovenox  Code Status: Full code Family Communication: patient and wife  Disposition Plan: pending pt/ot   Consultants:  orthopedic  Procedures:  IMN to left femoral on 9/3  Antibiotics:  none   Objective: BP 148/69 mmHg  Pulse 66  Temp(Src) 99.3 F (37.4 C) (Oral)  Resp 16  Ht 6\' 3"  (1.905 m)  Wt 221 lb 9.6 oz (100.517 kg)  BMI 27.70 kg/m2  SpO2 97%  Intake/Output Summary (Last 24 hours) at 12/26/14 1549 Last data filed at 12/26/14 1300  Gross per 24 hour  Intake    480 ml  Output      0 ml  Net    480 ml   Filed Weights   12/24/14 0702  Weight: 221 lb 9.6 oz (100.517 kg)    Exam:   General:  NAD  Cardiovascular: RRR  Respiratory: CTABL  Abdomen: Soft/ND/NT, positive BS  Musculoskeletal: No Edema, post op changes left hip  Neuro: aaox3, baseline hard of hearing  Data Reviewed: Basic Metabolic Panel:  Recent Labs Lab 12/24/14 0912 12/25/14 0745 12/26/14 0424  NA 137 134* 138  K 3.8 3.5 3.7  CL 105 105 105  CO2 27 24 27   GLUCOSE 135* 113* 111*  BUN 10 10 8   CREATININE 0.97 0.99 0.98  CALCIUM 8.7* 8.1* 8.2*  MG  --   --  1.8   Liver Function Tests:  Recent Labs Lab 12/24/14 0912 12/26/14 0424  AST 28 24  ALT 23 16*  ALKPHOS 72 52  BILITOT  1.5* 0.9  PROT 6.5 5.4*  ALBUMIN 3.6 2.7*   No results for input(s): LIPASE, AMYLASE in the last 168 hours. No results for input(s): AMMONIA in the last 168 hours. CBC:  Recent Labs Lab 12/24/14 0912 12/25/14 0745 12/26/14 0424  WBC 8.4 8.4 7.2  HGB 14.2 12.6* 11.1*  HCT 41.8 36.8* 33.0*  MCV 90.3 92.2 91.4  PLT 128* 114* 99*   Cardiac Enzymes:   No results for input(s): CKTOTAL, CKMB, CKMBINDEX, TROPONINI in the last 168 hours. BNP (last 3 results) No results for input(s): BNP in the last 8760 hours.  ProBNP (last 3 results) No results for input(s): PROBNP in the last 8760 hours.  CBG:  Recent Labs Lab 12/24/14 1515  GLUCAP 106*    Recent Results (from the past 240 hour(s))  Surgical pcr screen     Status: Abnormal   Collection Time: 12/24/14  8:58 AM  Result Value Ref Range Status   MRSA, PCR NEGATIVE NEGATIVE Final   Staphylococcus aureus POSITIVE (A) NEGATIVE Final    Comment:        The Xpert SA Assay (FDA approved for NASAL specimens in patients over 107 years of age), is one component of a comprehensive surveillance program.  Test performance has been validated by [facility] for patients greater than or equal to  59 year old. It is not intended to diagnose infection nor to guide or monitor treatment.      Studies: No results found.  Scheduled Meds: . amLODipine  5 mg Oral Daily  . docusate sodium  100 mg Oral BID  . enoxaparin (LOVENOX) injection  40 mg Subcutaneous Q24H  . ferrous sulfate  325 mg Oral TID PC  . lisinopril  40 mg Oral Daily  . mupirocin ointment   Nasal BID  . pantoprazole  40 mg Oral Daily  . senna  1 tablet Oral BID    Continuous Infusions:     Time spent: 8mins  Kaye Mitro MD, PhD  [facility] Pager [phone]. If 7PM-7AM, please contact night-coverage at [email or web address], password [facility] 12/26/2014, 3:49 PM  LOS: 2 days

## 2014-12-26 NOTE — Progress Notes (Signed)
Physical Therapy Treatment Patient Details Name: Steven Boyd MRN: [id number] DOB: [birth year] Today's Date: 12/26/2014    History of Present Illness Admitted after fall from tractor trailer resulting in L femur intertrochanteric fx, scalp lac, and pt also reports shoulder pain L    PT Comments    Patient able to increase his bed mobility and ambulation distance in afternoon. Patient motivated to be able to go home upon D/C. Patient does report having stairs at home which he will have to use to enter his home. Will continue with PT services to increase mobility for anticipated D/C home with family assistance.   Follow Up Recommendations  Home health PT     Equipment Recommendations  Rolling walker with 5" wheels    Recommendations for Other Services       Precautions / Restrictions Precautions Precautions: Fall Restrictions Weight Bearing Restrictions: Yes LLE Weight Bearing: Weight bearing as tolerated    Mobility  Bed Mobility Overal bed mobility: Needs Assistance Bed Mobility: Supine to Sit     Supine to sit: HOB elevated;Min guard     General bed mobility comments: slow transitions but decreased support needed.   Transfers Overall transfer level: Needs assistance Equipment used: Rolling walker (2 wheeled) Transfers: Sit to/from Stand Sit to Stand: Min assist         General transfer comment: cues to use one hand to push from bed.   Ambulation/Gait Ambulation/Gait assistance: Min guard Ambulation Distance (Feet): 30 Feet Assistive device: Rolling walker (2 wheeled) Gait Pattern/deviations: Step-to pattern Gait velocity: decreased   General Gait Details: gradually increasing stride and weightbearing, decreasing guarding with increasing distance.    Stairs            Wheelchair Mobility    Modified Rankin (Stroke Patients Only)       Balance Overall balance assessment: Needs assistance Sitting-balance support: No upper extremity  supported Sitting balance-Leahy Scale: Good     Standing balance support: Bilateral upper extremity supported Standing balance-Leahy Scale: Poor                      Cognition Arousal/Alertness: Awake/alert Behavior During Therapy: WFL for tasks assessed/performed Overall Cognitive Status: Within Functional Limits for tasks assessed                      Exercises      General Comments        Pertinent Vitals/Pain Pain Assessment: 0-10 Pain Score: 4  Pain Location: Lt hip and arms Pain Descriptors / Indicators: Aching;Sore Pain Intervention(s): Monitored during session    Home Living                      Prior Function            PT Goals (current goals can now be found in the care plan section) Acute Rehab PT Goals Patient Stated Goal: not retire PT Goal Formulation: With patient Time For Goal Achievement: 01/08/15 Potential to Achieve Goals: Good Progress towards PT goals: Progressing toward goals    Frequency  Min 6X/week    PT Plan Current plan remains appropriate    Co-evaluation             End of Session Equipment Utilized During Treatment: Gait belt Activity Tolerance: Patient tolerated treatment well Patient left: in chair;with call bell/phone within reach     Time: 4462-8638 PT Time Calculation (min) (ACUTE ONLY): 23 min  Charges:  $  Gait Training: 23-37 mins                    G Codes:      Cassell Clement, PT, CSCS Pager [phone] Office [phone]  12/26/2014, 4:15 PM

## 2014-12-27 ENCOUNTER — Encounter (HOSPITAL_COMMUNITY): Payer: Self-pay | Admitting: Orthopedic Surgery

## 2014-12-27 LAB — BASIC METABOLIC PANEL
ANION GAP: 9 (ref 5–15)
BUN: 7 mg/dL (ref 6–20)
CHLORIDE: 104 mmol/L (ref 101–111)
CO2: 24 mmol/L (ref 22–32)
Calcium: 8.6 mg/dL — ABNORMAL LOW (ref 8.9–10.3)
Creatinine, Ser: 0.86 mg/dL (ref 0.61–1.24)
GFR calc Af Amer: >60 mL/min (ref >60)
GFR calc non Af Amer: >60 mL/min (ref >60)
GLUCOSE: 147 mg/dL — AB (ref 65–99)
POTASSIUM: 3.1 mmol/L — AB (ref 3.5–5.1)
Sodium: 137 mmol/L (ref 135–145)

## 2014-12-27 LAB — CBC
HCT: 34.3 % — ABNORMAL LOW (ref 39.0–52.0)
HEMOGLOBIN: 11.6 g/dL — AB (ref 13.0–17.0)
MCH: 30.9 pg (ref 26.0–34.0)
MCHC: 33.8 g/dL (ref 30.0–36.0)
MCV: 91.2 fL (ref 78.0–100.0)
Platelets: 119 10*3/uL — ABNORMAL LOW (ref 150–400)
RBC: 3.76 MIL/uL — AB (ref 4.22–5.81)
RDW: 13 % (ref 11.5–15.5)
WBC: 6.2 10*3/uL (ref 4.0–10.5)

## 2014-12-27 LAB — HEMOGLOBIN A1C
Hgb A1c MFr Bld: 5.4 % (ref 4.8–5.6)
Mean Plasma Glucose: 108 mg/dL

## 2014-12-27 LAB — MAGNESIUM: MAGNESIUM: 1.7 mg/dL (ref 1.7–2.4)

## 2014-12-27 MED ORDER — AMLODIPINE BESYLATE 5 MG PO TABS
5.0000 mg | ORAL_TABLET | Freq: Every day | ORAL | Status: DC
Start: 2014-12-27 — End: 2016-05-01

## 2014-12-27 MED ORDER — CARVEDILOL 3.125 MG PO TABS
3.1250 mg | ORAL_TABLET | Freq: Two times a day (BID) | ORAL | Status: DC
Start: 2014-12-27 — End: 2014-12-27

## 2014-12-27 MED ORDER — CARVEDILOL 3.125 MG PO TABS: 3.1250 mg | ORAL_TABLET | Freq: Two times a day (BID) | ORAL | Status: DC

## 2014-12-27 MED ORDER — POTASSIUM CHLORIDE CRYS ER 20 MEQ PO TBCR
40.0000 meq | EXTENDED_RELEASE_TABLET | Freq: Once | ORAL | Status: AC
  Administered 2014-12-27: 40 meq via ORAL
  Filled 2014-12-27: qty 2

## 2014-12-27 NOTE — Progress Notes (Signed)
Occupational Therapy Treatment Patient Details Name: Steven Boyd MRN: [id number] DOB: December 05, 1940 Today's Date: 12/27/2014    History of present illness Admitted after fall from tractor trailer resulting in L femur intertrochanteric fx, scalp lac, and pt also reports shoulder pain L   OT comments  Pt progressing well in OT. Educated pt and wife on use of AE for increased independence in ADLs, pt demonstrated use of reacher and sock aide for LB dressing. Simulated walk in shower transfer to 3 in 1, wife assisted pt with stand pivot transfer. Continue to follow acutely.    Follow Up Recommendations  Home health OT;Supervision/Assistance - 24 hour    Equipment Recommendations  3 in 1 bedside comode;Other (comment) (AE: sock aide, reacher, lh sponge, lh shoe horn)    Recommendations for Other Services  None at this time.    Precautions / Restrictions Precautions Precautions: Fall Restrictions Weight Bearing Restrictions: Yes LLE Weight Bearing: Weight bearing as tolerated       Mobility Bed Mobility      General bed mobility comments: pt found in chair and returned to chair at end of session  Transfers Overall transfer level: Needs assistance Equipment used: Rolling walker (2 wheeled) Transfers: Sit to/from [facility] Sit to Stand: Min assist Stand pivot transfers: Min assist       General transfer comment: verbal cues for technique and safety during transfer. O2=94 after stand pivot transfer x 2    Balance Overall balance assessment: Needs assistance Sitting-balance support: No upper extremity supported Sitting balance-Leahy Scale: Good     Standing balance support: Bilateral upper extremity supported Standing balance-Leahy Scale: Poor Standing balance comment: rwx for UE support      ADL Overall ADL's : Needs assistance/impaired    Lower Body Dressing: Supervision/safety;With adaptive equipment Lower Body Dressing Details (indicate cue  type and reason): Pt able to don/doff sock using reacher and sock aide. Explained and demonstrated use of AE for LB dressing, pt and wife understand and accept AE for increased independence Toilet Transfer: Minimal assistance;Stand-pivot;BSC;RW       Tub/ Banker: Minimal assistance;Stand-pivot;3 in 1 Tub/Shower Transfer Details (indicate cue type and reason): Simulated walk in shower tsf with pt and wife. Pt understands safety and methods to transfer  Functional mobility during ADLs: Minimal assistance General ADL Comments: Pt with increased independence using AE for LB ADLs. Wife will be home to assist 24 hour, however she would like him to be as independent as possible with ADL axs                Cognition   Behavior During Therapy: WFL for tasks assessed/performed Overall Cognitive Status: Within Functional Limits for tasks assessed                  Pertinent Vitals/ Pain       Pain Assessment: 0-10 Pain Score: 9  Pain Location: L hip during mobility Pain Descriptors / Indicators: Aching;Sharp Pain Intervention(s): Limited activity within patient's tolerance;Monitored during session;Repositioned;Patient requesting pain meds-RN notified         Frequency Min 2X/week     Progress Toward Goals  OT Goals(current goals can now be found in the care plan section)  Progress towards OT goals: Progressing toward goals  Acute Rehab OT Goals Patient Stated Goal: take a shower Time For Goal Achievement: 01/09/15 Potential to Achieve Goals: Good  Plan Discharge plan remains appropriate       End of Session Equipment Utilized During Treatment:  Gait belt;Rolling walker   Activity Tolerance Patient tolerated treatment well   Patient Left in chair;with call bell/phone within reach;with family/visitor present           Time: 4503-8882 OT Time Calculation (min): 31 min  Charges: OT General Charges $OT Visit: 1 Procedure OT Treatments $Self Care/Home  Management : 23-37 mins  Binnie Kand M.S., OTR/L [phone]  12/27/2014, 10:11 AM

## 2014-12-27 NOTE — Discharge Summary (Signed)
Discharge Summary  Steven Boyd [id number] DOB: December 23, 1940  PCP: Steven Boyd., MD  Admit date: 12/24/2014 Discharge date: 12/27/2014  Time spent: >37mins  Recommendations for Outpatient Follow-up:  1. F/u with PMD Steven Boyd in one week, pmd to continue optimize blood pressure control and heart rate. 2. F/u with orthopedic Dr. Mardelle Boyd   Discharge Diagnoses:  Active Hospital Problems   Diagnosis Date Noted  . Intertrochanteric fracture of left hip 12/24/2014  . HTN (hypertension) 12/24/2014    Resolved Hospital Problems   Diagnosis Date Noted Date Resolved  No resolved problems to display.    Discharge Condition: stable  Diet recommendation: heart healthy/carb modified  Filed Weights   12/24/14 0702  Weight: 221 lb 9.6 oz (100.517 kg)    History of present illness:  Steven Boyd is a 74 y.o. male with a past medical history of hypertension and diet-controlled diabetes mellitus who presents as a transfer from[facility] for further treatment of hip fracture. Steven Boyd was in his usual state health until yesterday evening where he fell while getting out of his tractor-trailer. He reported it had been a rainy night . 2 use a restroom. Slipped on the wet railing while getting out of this truck landed on his left hip. He also reported injuring the back of his head. Bystanders called EMS. He was taken to outside hospital where CT scan of brain did not reveal intracranial hemorrhage. Hip x-ray revealed an acute intertrochanteric fracture of the proximal left femur. Staples were placed for his head laceration. Prior to this event patient reports being highly active, still working as a Administrator. He states his only medical history is diet-controlled diabetes and hypertension. I discussed case with StevenLandau of orthopedic surgery who will evaluate patient this morning.  Hospital Course:  Principal Problem:   Intertrochanteric fracture of left  hip Active Problems:   HTN (hypertension)  Intertrochanteric hip fracture, s/p IMN to left hip on 9/3, pt/ot, on lovenox for DVT prophylaxis, ortho following, discharge planning  Hypertension. continue lisinopril 40 mg by mouth daily. Atenolol held due to bradycardia, start novasc for bp control, low dose coreg started , pmd to continue monitor blood pressure control and heart rate.  Diet-controlled diabetes mellitus. A1c 5.4. Am fasting glucose of 113. Continue Diet control for now  DVT prophylaxis. lovenox per ortho  Code Status: Full code Family Communication: patient and wife  Disposition Plan: home with home health pt/ot   Consultants:  orthopedic  Procedures:  IMN to left femoral on 9/3  Antibiotics: none   Discharge Exam: BP 119/71 mmHg  Pulse 91  Temp(Src) 98.5 F (36.9 C) (Oral)  Resp 18  Ht 6\' 3"  (1.905 m)  Wt 221 lb 9.6 oz (100.517 kg)  BMI 27.70 kg/m2  SpO2 98%   General: NAD  Cardiovascular: RRR  Respiratory: CTABL  Abdomen: Soft/ND/NT, positive BS  Musculoskeletal: No Edema, post op changes left hip  Neuro: aaox3, baseline hard of hearing   Discharge Instructions You were cared for by a hospitalist during your hospital stay. If you have any questions about your discharge medications or the care you received while you were in the hospital after you are discharged, you can call the unit and asked to speak with the hospitalist on call if the hospitalist that took care of you is not available. Once you are discharged, your primary care physician will handle any further medical issues. Please note that NO REFILLS for any discharge medications will be authorized once you  are discharged, as it is imperative that you return to your primary care physician (or establish a relationship with a primary care physician if you do not have one) for your aftercare needs so that they can reassess your need for medications and monitor your lab values.       Discharge Instructions    Diet - low sodium heart healthy    Complete by:  As directed   Carb modified     Face-to-face encounter (required for Medicare/Medicaid patients)    Complete by:  As directed   I Steven Boyd certify that this patient is under my care and that I, or a nurse practitioner or physician's assistant working with me, had a face-to-face encounter that meets the physician face-to-face encounter requirements with this patient on 12/27/2014. The encounter with the patient was in whole, or in part for the following medical condition(s) which is the primary reason for home health care (List medical condition): FTT  The encounter with the patient was in whole, or in part, for the following medical condition, which is the primary reason for home health care:  FTT, hip fracture  I certify that, based on my findings, the following services are medically necessary home health services:  Physical therapy  Reason for Medically Necessary Home Health Services:  Skilled Nursing- Change/Decline in Patient Status  My clinical findings support the need for the above services:  Pain interferes with ambulation/mobility  Further, I certify that my clinical findings support that this patient is homebound due to:  Pain interferes with ambulation/mobility     Home Health    Complete by:  As directed   To provide the following care/treatments:   PT OT       Increase activity slowly    Complete by:  As directed      Weight bearing as tolerated    Complete by:  As directed             Medication List    STOP taking these medications        aspirin 81 MG tablet     atenolol 50 MG tablet  Commonly known as:  TENORMIN      TAKE these medications        amLODipine 5 MG tablet  Commonly known as:  NORVASC  Take 1 tablet (5 mg total) by mouth daily.     baclofen 10 MG tablet  Commonly known as:  LIORESAL  Take 1 tablet (10 mg total) by mouth 3 (three) times daily. As needed for muscle spasm      calcium-vitamin D 500-200 MG-UNIT per tablet  Commonly known as:  OSCAL WITH D  Take 1 tablet by mouth daily.     carvedilol 3.125 MG tablet  Commonly known as:  COREG  Take 1 tablet (3.125 mg total) by mouth 2 (two) times daily with a meal.     Cinnamon 500 MG capsule  Take 500 mg by mouth daily.     enoxaparin 40 MG/0.4ML injection  Commonly known as:  LOVENOX  Inject 0.4 mLs (40 mg total) into the skin daily.     Fish Oil 1000 MG Caps  Take 1,000 mg by mouth daily.     lisinopril 40 MG tablet  Commonly known as:  PRINIVIL,ZESTRIL  Take 40 mg by mouth 2 (two) times daily.     multivitamin capsule  Take 1 capsule by mouth daily.     oxyCODONE-acetaminophen 5-325 MG per tablet  Commonly known as:  ROXICET  Take 1-2 tablets by mouth every 6 (six) hours as needed for severe pain.     sennosides-docusate sodium 8.6-50 MG tablet  Commonly known as:  SENOKOT-S  Take 2 tablets by mouth daily.       Allergies  Allergen Reactions  . Penicillins Itching   Follow-up Information    Follow up with Johnny Bridge, MD. Schedule an appointment as soon as possible for a visit in 2 weeks.   Specialty:  Orthopedic Surgery   Contact information:   [address] [place] [address] [phone]       Follow up with Steven Boyd., MD In 1 week.   Specialty:  Internal Medicine   Why:  hospital discharge follow up, pmd to monitor blood pressure and heart rate. continue titrate blood pressure meds.   Contact information:   [address] [place] [address] [phone]        The results of significant diagnostics from this hospitalization (including imaging, microbiology, ancillary and laboratory) are listed below for reference.    Significant Diagnostic Studies: Dg C-arm 1-60 Min  12/24/2014   CLINICAL DATA:  Left femur fracture.  EXAM: LEFT FEMUR 2 VIEWS; DG C-ARM 61-120 MIN  COMPARISON:  None.  FINDINGS: Five C-arm views of the left  femur demonstrate intramedullary rod and nail fixation of an intertrochanteric fracture. Anatomic position and alignment on these views.  IMPRESSION: Hardware fixation of a left intertrochanteric fracture.   Electronically Signed   By: Claudie Revering M.D.   On: 12/24/2014 19:40   Dg Hip Port Unilat With Pelvis 1v Left  12/24/2014   CLINICAL DATA:  ORIF left hip fracture.  EXAM: DG HIP (WITH OR WITHOUT PELVIS) 1V PORT LEFT  COMPARISON:  12/24/2014 intraoperative views.  FINDINGS: Intra medullary nail and delayed are noted traversing a left intertrochanteric femur fracture in near-anatomic alignment and position. No complicating features are identified.  IMPRESSION: ORIF left intertrochanteric femur fracture in near-anatomic alignment and position.   Electronically Signed   By: Margarette Canada M.D.   On: 12/24/2014 20:34   Dg Femur Min 2 Views Left  12/24/2014   CLINICAL DATA:  Left femur fracture.  EXAM: LEFT FEMUR 2 VIEWS; DG C-ARM 61-120 MIN  COMPARISON:  None.  FINDINGS: Five C-arm views of the left femur demonstrate intramedullary rod and nail fixation of an intertrochanteric fracture. Anatomic position and alignment on these views.  IMPRESSION: Hardware fixation of a left intertrochanteric fracture.   Electronically Signed   By: Claudie Revering M.D.   On: 12/24/2014 19:40    Microbiology: Recent Results (from the past 240 hour(s))  Surgical pcr screen     Status: Abnormal   Collection Time: 12/24/14  8:58 AM  Result Value Ref Range Status   MRSA, PCR NEGATIVE NEGATIVE Final   Staphylococcus aureus POSITIVE (A) NEGATIVE Final    Comment:        The Xpert SA Assay (FDA approved for NASAL specimens in patients over 20 years of age), is one component of a comprehensive surveillance program.  Test performance has been validated by [facility] for patients greater than or equal to 30 year old. It is not intended to diagnose infection nor to guide or monitor treatment.      Labs: Basic Metabolic  Panel:  Recent Labs Lab 12/24/14 0912 12/25/14 0745 12/26/14 0424 12/27/14 0625  NA 137 134* 138 137  K 3.8 3.5 3.7 3.1*  CL 105 105 105 104  CO2  27 24 27 24   GLUCOSE 135* 113* 111* 147*  BUN 10 10 8 7   CREATININE 0.97 0.99 0.98 0.86  CALCIUM 8.7* 8.1* 8.2* 8.6*  MG  --   --  1.8 1.7   Liver Function Tests:  Recent Labs Lab 12/24/14 0912 12/26/14 0424  AST 28 24  ALT 23 16*  ALKPHOS 72 52  BILITOT 1.5* 0.9  PROT 6.5 5.4*  ALBUMIN 3.6 2.7*   No results for input(s): LIPASE, AMYLASE in the last 168 hours. No results for input(s): AMMONIA in the last 168 hours. CBC:  Recent Labs Lab 12/24/14 0912 12/25/14 0745 12/26/14 0424 12/27/14 0625  WBC 8.4 8.4 7.2 6.2  HGB 14.2 12.6* 11.1* 11.6*  HCT 41.8 36.8* 33.0* 34.3*  MCV 90.3 92.2 91.4 91.2  PLT 128* 114* 99* 119*   Cardiac Enzymes: No results for input(s): CKTOTAL, CKMB, CKMBINDEX, TROPONINI in the last 168 hours. BNP: BNP (last 3 results) No results for input(s): BNP in the last 8760 hours.  ProBNP (last 3 results) No results for input(s): PROBNP in the last 8760 hours.  CBG:  Recent Labs Lab 12/24/14 1515  GLUCAP 106*       Signed:  Sarafina Puthoff MD, PhD  [facility] 12/27/2014, 2:28 PM

## 2014-12-27 NOTE — Progress Notes (Signed)
Physical Therapy Treatment Patient Details Name: Steven Boyd MRN: [id number] DOB: December 09, 1940 Today's Date: 12/27/2014    History of Present Illness Admitted after fall from tractor trailer resulting in L femur intertrochanteric fx, scalp lac, and pt also reports shoulder pain L    PT Comments    Session focus was on gait and stairs. Patient able to ambulate up/down 2 steps with rail and moderate assistance. Improving mobility with gait at this time. Will continue with skilled PT sessions to improve mobility for anticipated D/C to home.   Follow Up Recommendations  Home health PT     Equipment Recommendations  Rolling walker with 5" wheels    Recommendations for Other Services       Precautions / Restrictions Precautions Precautions: Fall Restrictions Weight Bearing Restrictions: Yes LLE Weight Bearing: Weight bearing as tolerated    Mobility  Bed Mobility               General bed mobility comments: found sitting edge of bed  Transfers Overall transfer level: Needs assistance Equipment used: Rolling walker (2 wheeled) Transfers: Sit to/from Stand Sit to Stand: Min assist         General transfer comment: no cues needed for hand placement  Ambulation/Gait Ambulation/Gait assistance: Min guard Ambulation Distance (Feet): 20 Feet Assistive device: Rolling walker (2 wheeled) Gait Pattern/deviations: Step-to pattern;Decreased weight shift to left Gait velocity: decreased   General Gait Details: encouraging gradual weightbearing on LLE   Stairs Stairs: Yes Stairs assistance: Mod assist Stair Management: One rail Left;Sideways Number of Stairs: 2 General stair comments: manual assist and verbal cues for stair sequence. Spouse observing session.   Wheelchair Mobility    Modified Rankin (Stroke Patients Only)       Balance Overall balance assessment: Needs assistance Sitting-balance support: No upper extremity supported Sitting balance-Leahy  Scale: Good     Standing balance support: Bilateral upper extremity supported Standing balance-Leahy Scale: Poor                      Cognition Arousal/Alertness: Awake/alert Behavior During Therapy: WFL for tasks assessed/performed Overall Cognitive Status: Within Functional Limits for tasks assessed                      Exercises      General Comments        Pertinent Vitals/Pain Pain Assessment: 0-10 Pain Score: 8  Pain Location: Lt hip, bilateral UEs Pain Descriptors / Indicators: Aching;Sharp;Sore Pain Intervention(s): Monitored during session;Limited activity within patient's tolerance    Home Living                      Prior Function            PT Goals (current goals can now be found in the care plan section) Acute Rehab PT Goals Patient Stated Goal: take a shower PT Goal Formulation: With patient Time For Goal Achievement: 01/08/15 Potential to Achieve Goals: Good Progress towards PT goals: Progressing toward goals    Frequency  Min 6X/week    PT Plan Current plan remains appropriate    Co-evaluation             End of Session Equipment Utilized During Treatment: Gait belt Activity Tolerance: Patient limited by fatigue Patient left: with call bell/phone within reach;in chair;with family/visitor present     Time: 3875-6433 PT Time Calculation (min) (ACUTE ONLY): 28 min  Charges:  $Gait Training: 23-37 mins  G Codes:      Cassell Clement, PT, CSCS Pager [phone] Office [phone]  12/27/2014, 9:10 AM

## 2014-12-27 NOTE — Care Management Note (Signed)
Case Management Note  Patient Details  Name: Steven Boyd MRN: [id number] Date of Birth: [birth year]  Subjective/Objective:  74 yr old male admitted with left hip fracture from a fall. S/p Left hip IM Nailing.                  Action/Plan: Case manager spoke with patient and wife this am concerning contacting worker's comp. CM has received permission to discuss and fax needed information. Case manager contacted Margreta Journey @ [place] [phone] who asked me to contact [facility] with HR @ [phone] or [phone]. Case manager called and left message.  0945 Case Manager spoke with Solicitor at [employer] who provided name and number for Su Hoff, CM called and left a message for Rexann Honeycutt@ [phone]. Will continue to monitor.   Expected Discharge Date:    12/27/14             Expected Discharge Plan:  [place]  In-House Referral:     Discharge planning Services  CM Consult  Post Acute Care Choice:    Choice offered to:     DME Arranged:   Rolling walker, 3in1, Hip Kit DME Agency:   [place]:   PT [place] Agency:   to be arranged by worker's comp.  Status of Service:  Completed.   Medicare Important Message Given:    Date Medicare IM Given:    Medicare IM give by:    Date Additional Medicare IM Given:    Additional Medicare Important Message give by:     If discussed at [place] of Stay Meetings, dates discussed:    Additional Comments: Case manager spoke with Rexann  Honeycutt and Rosita Kea concerning patient's discharge needs.  Patient has not been assigned a claim number yet. Orders and notes were faxed to Rosita Kea, adjuster at [phone], her phone # is [phone]. CM faxed orders to [employer], broadcasting for [employer] for DME ) [phone].  4:15pm Case manager received call from [place] with [employer] concerning DME. Case manager asked for authorization number to obtain  DME. Authorization number is [id number]. Case manager contacted Karlyn Agee DME Liaison with [facility] to get rolling walker and 3in1.      Ninfa Meeker, RN        12/27/2014, 9:44 AM

## 2014-12-27 NOTE — Progress Notes (Signed)
Patient ID: Steven Boyd, male   DOB: 10-18-1940, 74 y.o.   MRN: [id number]     Subjective:  Patient reports pain as mild patient states that he is improving denies any CP or SOB.  Does report pain and soreness in both shoulders.    Objective:   VITALS:   Filed Vitals:   12/26/14 2049 12/27/14 0600 12/27/14 1020 12/27/14 1021  BP: 158/71 148/88 148/88 148/88  Pulse: 65 104    Temp: 99.1 F (37.3 C) 98.7 F (37.1 C)    TempSrc:      Resp: 16 18    Height:      Weight:      SpO2: 95% 95%      ABD soft Sensation intact distally Dorsiflexion/Plantar flexion intact Incision: dressing C/D/I and no drainage Exam of both shoulder showed that he has good ROM 0-160 bilaterally    Lab Results  Component Value Date   WBC 6.2 12/27/2014   HGB 11.6* 12/27/2014   HCT 34.3* 12/27/2014   MCV 91.2 12/27/2014   PLT 119* 12/27/2014   BMET    Component Value Date/Time   NA 137 12/27/2014 0625   K 3.1* 12/27/2014 0625   CL 104 12/27/2014 0625   CO2 24 12/27/2014 0625   GLUCOSE 147* 12/27/2014 0625   BUN 7 12/27/2014 0625   CREATININE 0.86 12/27/2014 0625   CALCIUM 8.6* 12/27/2014 0625   GFRNONAA >60 12/27/2014 0625   GFRAA >60 12/27/2014 0625     Assessment/Plan: 3 Days Post-Op   Principal Problem:   Intertrochanteric fracture of left hip Active Problems:   HTN (hypertension)   Advance diet Up with therapy  Continue plan per medicine WBAT left lower ext Dry dressing PRN Okay for DC if passes PT Continue to monitor shoulder pain    Steven Boyd, Steven Boyd 12/27/2014, 11:09 AM  Seen and agree.   Marchia Bond, MD Cell [phone]

## 2014-12-27 NOTE — Progress Notes (Signed)
     Subjective:  Patient reports pain as moderate.  Having some pain in both shoulders, while weight bearing with upper extremities.  Otherwise doing well.    Objective:   VITALS:   Filed Vitals:   12/26/14 2049 12/27/14 0600 12/27/14 1020 12/27/14 1021  BP: 158/71 148/88 148/88 148/88  Pulse: 65 104    Temp: 99.1 F (37.3 C) 98.7 F (37.1 C)    TempSrc:      Resp: 16 18    Height:      Weight:      SpO2: 95% 95%      Neurologically intact Dorsiflexion/Plantar flexion intact Incision: dressing C/D/I and scant drainage Both shoulders have AROM 0-160 with intact cuff strength to infraspinatus and supraspinatus testing.    Lab Results  Component Value Date   WBC 6.2 12/27/2014   HGB 11.6* 12/27/2014   HCT 34.3* 12/27/2014   MCV 91.2 12/27/2014   PLT 119* 12/27/2014   BMET    Component Value Date/Time   NA 137 12/27/2014 0625   K 3.1* 12/27/2014 0625   CL 104 12/27/2014 0625   CO2 24 12/27/2014 0625   GLUCOSE 147* 12/27/2014 0625   BUN 7 12/27/2014 0625   CREATININE 0.86 12/27/2014 0625   CALCIUM 8.6* 12/27/2014 0625   GFRNONAA >60 12/27/2014 0625   GFRAA >60 12/27/2014 0625     Assessment/Plan: 3 Days Post-Op   Principal Problem:   Intertrochanteric fracture of left hip Active Problems:   HTN (hypertension)   Advance diet Up with therapy Discharge home with home health May need home equipment and support given his size and limited functionality.     Steven Boyd 12/27/2014, 11:08 AM   Marchia Bond, MD Cell [phone]

## 2015-01-11 DIAGNOSIS — S7222XD Displaced subtrochanteric fracture of left femur, subsequent encounter for closed fracture with routine healing: Secondary | ICD-10-CM | POA: Diagnosis not present

## 2015-01-19 ENCOUNTER — Emergency Department (HOSPITAL_COMMUNITY)
Admission: EM | Admit: 2015-01-19 | Discharge: 2015-01-19 | Disposition: A | Discharge status: Home / Self Care | Payer: Medicare Other | Attending: Emergency Medicine | Admitting: Emergency Medicine

## 2015-01-19 ENCOUNTER — Encounter (HOSPITAL_COMMUNITY): Payer: Self-pay | Admitting: Emergency Medicine

## 2015-01-19 ENCOUNTER — Emergency Department (HOSPITAL_COMMUNITY): Payer: Medicare Other

## 2015-01-19 DIAGNOSIS — K1120 Sialoadenitis, unspecified: Secondary | ICD-10-CM | POA: Insufficient documentation

## 2015-01-19 DIAGNOSIS — J029 Acute pharyngitis, unspecified: Secondary | ICD-10-CM | POA: Diagnosis not present

## 2015-01-19 DIAGNOSIS — Z8781 Personal history of (healed) traumatic fracture: Secondary | ICD-10-CM | POA: Diagnosis not present

## 2015-01-19 DIAGNOSIS — R220 Localized swelling, mass and lump, head: Secondary | ICD-10-CM | POA: Diagnosis not present

## 2015-01-19 DIAGNOSIS — Z79899 Other long term (current) drug therapy: Secondary | ICD-10-CM | POA: Insufficient documentation

## 2015-01-19 DIAGNOSIS — R600 Localized edema: Secondary | ICD-10-CM | POA: Diagnosis present

## 2015-01-19 DIAGNOSIS — I10 Essential (primary) hypertension: Secondary | ICD-10-CM | POA: Diagnosis not present

## 2015-01-19 DIAGNOSIS — H9193 Unspecified hearing loss, bilateral: Secondary | ICD-10-CM | POA: Insufficient documentation

## 2015-01-19 DIAGNOSIS — Z880 Allergy status to penicillin: Secondary | ICD-10-CM | POA: Insufficient documentation

## 2015-01-19 DIAGNOSIS — E119 Type 2 diabetes mellitus without complications: Secondary | ICD-10-CM | POA: Diagnosis not present

## 2015-01-19 LAB — BASIC METABOLIC PANEL
Anion gap: 10 (ref 5–15)
BUN: 16 mg/dL (ref 6–20)
CALCIUM: 9.7 mg/dL (ref 8.9–10.3)
CO2: 25 mmol/L (ref 22–32)
CREATININE: 1.02 mg/dL (ref 0.61–1.24)
Chloride: 103 mmol/L (ref 101–111)
GFR calc Af Amer: >60 mL/min (ref >60)
GFR calc non Af Amer: >60 mL/min (ref >60)
GLUCOSE: 120 mg/dL — AB (ref 65–99)
Potassium: 3.7 mmol/L (ref 3.5–5.1)
Sodium: 138 mmol/L (ref 135–145)

## 2015-01-19 LAB — CBC WITH DIFFERENTIAL/PLATELET
Basophils Absolute: 0 10*3/uL (ref 0.0–0.1)
Basophils Relative: 0 %
EOS PCT: 1 %
Eosinophils Absolute: 0.1 10*3/uL (ref 0.0–0.7)
HEMATOCRIT: 39.8 % (ref 39.0–52.0)
Hemoglobin: 13.6 g/dL (ref 13.0–17.0)
LYMPHS PCT: 15 %
Lymphs Abs: 1.4 10*3/uL (ref 0.7–4.0)
MCH: 31.6 pg (ref 26.0–34.0)
MCHC: 34.2 g/dL (ref 30.0–36.0)
MCV: 92.3 fL (ref 78.0–100.0)
MONO ABS: 0.9 10*3/uL (ref 0.1–1.0)
MONOS PCT: 10 %
NEUTROS ABS: 6.8 10*3/uL (ref 1.7–7.7)
Neutrophils Relative %: 74 %
PLATELETS: 191 10*3/uL (ref 150–400)
RBC: 4.31 MIL/uL (ref 4.22–5.81)
RDW: 12.9 % (ref 11.5–15.5)
WBC: 9.2 10*3/uL (ref 4.0–10.5)

## 2015-01-19 LAB — RAPID STREP SCREEN (MED CTR MEBANE ONLY): Streptococcus, Group A Screen (Direct): NEGATIVE

## 2015-01-19 MED ORDER — KETOROLAC TROMETHAMINE 30 MG/ML IJ SOLN
30.0000 mg | Freq: Once | INTRAMUSCULAR | Status: AC
Start: 2015-01-19 — End: 2015-01-19
  Administered 2015-01-19: 30 mg via INTRAVENOUS
  Filled 2015-01-19: qty 1

## 2015-01-19 MED ORDER — CLINDAMYCIN HCL 300 MG PO CAPS: 300.0000 mg | ORAL_CAPSULE | Freq: Four times a day (QID) | ORAL | Status: DC

## 2015-01-19 MED ORDER — CLINDAMYCIN PHOSPHATE 600 MG/50ML IV SOLN
600.0000 mg | Freq: Once | INTRAVENOUS | Status: AC
  Administered 2015-01-19: 600 mg via INTRAVENOUS
  Filled 2015-01-19: qty 50

## 2015-01-19 MED ORDER — SODIUM CHLORIDE 0.9 % IV SOLN
INTRAVENOUS | Status: DC
  Administered 2015-01-19: 05:00:00 via INTRAVENOUS

## 2015-01-19 MED ORDER — IOHEXOL 300 MG/ML  SOLN
75.0000 mL | Freq: Once | INTRAMUSCULAR | Status: AC | PRN
  Administered 2015-01-19: 75 mL via INTRAVENOUS

## 2015-01-19 NOTE — Discharge Instructions (Signed)
Use warm compresses on the right side of your face for comfort. Take the antibiotics until gone. Call Dr Deeann Saint office to be rechecked in the next week. Return to the ED if you get a fever, swelling of your face, you are unable to swallow or have trouble breathing.  Get some sour lemon drops to help your salivary glands make more saliva.      Salivary Gland Infection A salivary gland infection can be caused by a virus, bacteria from the mouth, or a stone. Mumps and other viruses may settle in one or more of the saliva glands. This will result in swelling, pain, and difficulty eating. Bacteria may cause a more severe infection in a salivary gland. A salivary stone blocking the flow of saliva can make this worse. These infections may be related to other medical problems. Some of these are dehydration, recent surgery, poor nutrition, and some medications. TREATMENT  Treatment of a salivary gland infection depends on the cause. Mumps and other virus infections do not require antibiotics. If bacteria cause the infection, then antibiotics are needed to get rid of the infection. If there is a salivary stone blocking the duct, minor surgery to remove the stone may be needed.  HOME CARE INSTRUCTIONS   Get plenty of rest, increase your fluids, and use warm compresses on the swollen area for 15 to 20 minutes 4 times per day or as often as feels good to you.  Suck on hard candy or chew sugarless gum to promote saliva production.  Only take over-the-counter or prescription medicines for pain, discomfort, or fever as directed by your caregiver. SEEK IMMEDIATE MEDICAL CARE IF:   You have increased swelling or pain or pain not relieved with medications.  You develop chills or a fever.  Any of your problems are getting worse rather than better. Document Released: 05/16/2004 Document Revised: 07/01/2011 Document Reviewed: 04/08/2005 [facility] Patient Information 2015 [place], [place]. This information is not  intended to replace advice given to you by your health care provider. Make sure you discuss any questions you have with your health care provider.

## 2015-01-19 NOTE — ED Notes (Signed)
Pt c/o swelling behind the rt ear that extends down neck and states it is hard to swallow.

## 2015-01-19 NOTE — ED Provider Notes (Signed)
CSN: [id number]     Arrival date & time 01/19/15  0404 History   First MD Initiated Contact with Patient 01/19/15 [phone]     Chief Complaint  Patient presents with  . Facial Swelling     (Consider location/radiation/quality/duration/timing/severity/associated sxs/prior Treatment) HPI patient states yesterday afternoon he started getting a popping in his right TMJ joint. He also feels like he has swelling behind his right ear. He denies any fever. He reports he has loss of hearing in both ears and it has not been worse on the right since he started having these problems. He states opening his mouth makes the pain a lot worse. He also complains of some sore throat when he swallows. He states he's never had any difficulty with his jaw before. He denies any pain in his teeth. He states one month ago he fell and fractured his left hip (intertrochanteric fracture) and had staples in his left scalp. These were removed yesterday.  He states he hit the left side of his face when he fell, however not the right.  PCP Dr Gerarda Fraction  Past Medical History  Diagnosis Date  . Hypertension   . Diabetes mellitus without complication     diet controlled  . Hard of hearing     bilateral, no hearing aids   Past Surgical History  Procedure Laterality Date  . Femur im nail Left 12/24/2014    Procedure: INTRAMEDULLARY (IM) NAIL FEMORAL;  Surgeon: Marchia Bond, MD;  Location: [facility];  Service: Orthopedics;  Laterality: Left;   History reviewed. No pertinent family history. Social History  Substance Use Topics  . Smoking status: Never Smoker   . Smokeless tobacco: None  . Alcohol Use: No  lives at home Lives with spouse Using a walker since his femur fracture  Review of Systems  All other systems reviewed and are negative.     Allergies  Penicillins  Home Medications   Prior to Admission medications   Medication Sig Start Date End Date Taking? Authorizing Provider  atenolol (TENORMIN) 50 MG tablet  Take 50 mg by mouth daily.   Yes Historical Provider, MD  HYDROcodone-acetaminophen (NORCO/VICODIN) 5-325 MG tablet Take 1 tablet by mouth every 6 (six) hours as needed for moderate pain.   Yes Historical Provider, MD  lisinopril (PRINIVIL,ZESTRIL) 40 MG tablet Take 40 mg by mouth 2 (two) times daily.    Yes Historical Provider, MD  Multiple Vitamin (MULTIVITAMIN) capsule Take 1 capsule by mouth daily.   Yes Historical Provider, MD  Omega-3 Fatty Acids (FISH OIL) 1000 MG CAPS Take 1,000 mg by mouth daily.   Yes Historical Provider, MD  amLODipine (NORVASC) 5 MG tablet Take 1 tablet (5 mg total) by mouth daily. 12/27/14   Florencia Reasons, MD  baclofen (LIORESAL) 10 MG tablet Take 1 tablet (10 mg total) by mouth 3 (three) times daily. As needed for muscle spasm 12/24/14   Marchia Bond, MD  calcium-vitamin D (OSCAL WITH D) 500-200 MG-UNIT per tablet Take 1 tablet by mouth daily. 12/24/14   Marchia Bond, MD  carvedilol (COREG) 3.125 MG tablet Take 1 tablet (3.125 mg total) by mouth 2 (two) times daily with a meal. 12/27/14   Florencia Reasons, MD  Cinnamon 500 MG capsule Take 500 mg by mouth daily.    Historical Provider, MD  clindamycin (CLEOCIN) 300 MG capsule Take 1 capsule (300 mg total) by mouth 4 (four) times daily. 01/19/15   Rolland Porter, MD  enoxaparin (LOVENOX) 40 MG/0.4ML injection Inject 0.4 mLs (  40 mg total) into the skin daily. 12/24/14   Marchia Bond, MD  oxyCODONE-acetaminophen (ROXICET) 5-325 MG per tablet Take 1-2 tablets by mouth every 6 (six) hours as needed for severe pain. 12/24/14   Marchia Bond, MD  sennosides-docusate sodium (SENOKOT-S) 8.6-50 MG tablet Take 2 tablets by mouth daily. 12/24/14   Marchia Bond, MD   BP 169/107 mmHg  Pulse 65  Temp(Src) 98.4 F (36.9 C) (Oral)  Resp 16  SpO2 97%  Vital signs normal except for hypertension  Physical Exam  Constitutional: He is oriented to person, place, and time. He appears well-developed and well-nourished.  Non-toxic appearance. He does not appear ill.  No distress.  HENT:  Head: Normocephalic and atraumatic.  Right Ear: External ear normal.  Left Ear: External ear normal.  Nose: Nose normal. No mucosal edema or rhinorrhea.  Mouth/Throat: Oropharynx is clear and moist and mucous membranes are normal. No dental abscesses or uvula swelling.  The left TM is opaque and appears to have some white solid material behind the TM. The right TM is less opaque but not normal, there is also some white behind that TM. Patient has pain on range of motion of his TMJ on the right with clicking. He also is tender to palpation in the parotid glands on the right side with minimal swelling. There is no obvious swelling or lymphadenopathy felt in the posterior or radicular or inferior reticular area. His teeth were palpated and the posterior molars were nontender to percussion both upper and lower. He has some mild diffuse redness of his posterior pharynx. There is no supraclavicular or cervical lymphadenopathy felt. His voice appears normal. Pt has known decreased hearing bilaterally.   Eyes: Conjunctivae and EOM are normal. Pupils are equal, round, and reactive to light.  Neck: Normal range of motion and full passive range of motion without pain. Neck supple.  Pulmonary/Chest: Effort normal. No respiratory distress. He has no rhonchi. He exhibits no crepitus.  Abdominal: Normal appearance.  Musculoskeletal: Normal range of motion.  Moves all extremities well.   Neurological: He is alert and oriented to person, place, and time. He has normal strength. No cranial nerve deficit.  Skin: Skin is warm, dry and intact. No rash noted. No erythema. No pallor.  Psychiatric: He has a normal mood and affect. His speech is normal and behavior is normal. His mood appears not anxious.  Nursing note and vitals reviewed.   ED Course  Procedures (including critical care time)  Medications  0.9 %  sodium chloride infusion ( Intravenous New Bag/Given 01/19/15 0442)  clindamycin  (CLEOCIN) IVPB 600 mg (600 mg Intravenous New Bag/Given 01/19/15 0619)  ketorolac (TORADOL) 30 MG/ML injection 30 mg (30 mg Intravenous Given 01/19/15 0442)  iohexol (OMNIPAQUE) 300 MG/ML solution 75 mL (75 mLs Intravenous Contrast Given 01/19/15 0532)    Patient was given IV pain medication. After reviewing his CT scan he was given clindamycin 600 mg IV. I talked to the patient and his family about his test results. They are already familiar with Dr. Benjamine Mola, ENT. He will be referred for evaluation of his ears and also his sialoadenitis. Patient is already on pain medication because of his recent fracture. His son states they're concerned because the patient will fall asleep easily. His pain medication dose has been reduced recently by his orthopedic surgeon.  Patient has known he has decreased hearing however he's never been violated by a ears nose and throat doctor for his hearing loss. He states he just  has hearing tests done.   Labs Review Results for orders placed or performed during the hospital encounter of 01/19/15  Rapid strep screen  Result Value Ref Range   Streptococcus, Group A Screen (Direct) NEGATIVE NEGATIVE  CBC with Differential/Platelet  Result Value Ref Range   WBC 9.2 4.0 - 10.5 K/uL   RBC 4.31 4.22 - 5.81 MIL/uL   Hemoglobin 13.6 13.0 - 17.0 g/dL   HCT 39.8 39.0 - 52.0 %   MCV 92.3 78.0 - 100.0 fL   MCH 31.6 26.0 - 34.0 pg   MCHC 34.2 30.0 - 36.0 g/dL   RDW 12.9 11.5 - 15.5 %   Platelets 191 150 - 400 K/uL   Neutrophils Relative % 74 %   Neutro Abs 6.8 1.7 - 7.7 K/uL   Lymphocytes Relative 15 %   Lymphs Abs 1.4 0.7 - 4.0 K/uL   Monocytes Relative 10 %   Monocytes Absolute 0.9 0.1 - 1.0 K/uL   Eosinophils Relative 1 %   Eosinophils Absolute 0.1 0.0 - 0.7 K/uL   Basophils Relative 0 %   Basophils Absolute 0.0 0.0 - 0.1 K/uL  Basic metabolic panel  Result Value Ref Range   Sodium 138 135 - 145 mmol/L   Potassium 3.7 3.5 - 5.1 mmol/L   Chloride 103 101 - 111  mmol/L   CO2 25 22 - 32 mmol/L   Glucose, Bld 120 (H) 65 - 99 mg/dL   BUN 16 6 - 20 mg/dL   Creatinine, Ser 1.02 0.61 - 1.24 mg/dL   Calcium 9.7 8.9 - 10.3 mg/dL   GFR calc non Af Amer >60 >60 mL/min   GFR calc Af Amer >60 >60 mL/min   Anion gap 10 5 - 15    Laboratory interpretation all normal except hyperglycemia    Imaging Review Ct Maxillofacial W/cm  01/19/2015   CLINICAL DATA:  RIGHT posterior auricular pain radiating to RIGHT neck. Difficulty swelling. History of hypertension, diabetes.  EXAM: CT MAXILLOFACIAL WITH CONTRAST  TECHNIQUE: Multidetector CT imaging of the maxillofacial structures was performed with intravenous contrast. Multiplanar CT image reconstructions were also generated. A small metallic BB was placed on the right temple in order to reliably differentiate right from left.  CONTRAST:  80mL OMNIPAQUE IOHEXOL 300 MG/ML  SOLN  COMPARISON:  None.  FINDINGS: LEFT maxillary mucosal retention cyst, no paranasal sinus air-fluid levels. The mastoid air cells are well aerated. Nasal septum deviated to the LEFT with small bony spur.  Mandible is intact, condyles are located. No facial fracture. No destructive bony lesions.  Ocular globes and orbital contents are normal.  Moderate to severe global brain atrophy partially imaged. No suspicious included intracranial enhancement.  Calcific atherosclerosis and intimal thickening of the carotid bulbs results in at least mild stenosis on the RIGHT.  Included view of the neck demonstrates edematous RIGHT submandibular gland (partially imaged), with small effusion in the RIGHT submandibular space. Small RIGHT retropharyngeal effusion. No sialolith or ductal dilatation. Patent included airway.  IMPRESSION: Acute RIGHT submandibular sialoadenitis, partially imaged. Small retropharyngeal effusion.  No CT findings of mastoiditis.  Atherosclerosis results in at least mild stenosis RIGHT internal carotid artery.   Electronically Signed   By: Elon Alas M.D.   On: 01/19/2015 05:50   I have personally reviewed and evaluated these images and lab results as part of my medical decision-making.   EKG Interpretation None      MDM   Final diagnoses:  Sialoadenitis of submandibular gland  New Prescriptions   CLINDAMYCIN (CLEOCIN) 300 MG CAPSULE    Take 1 capsule (300 mg total) by mouth 4 (four) times daily.    Plan discharge  Rolland Porter, MD, Barbette Or, MD 01/19/15 [phone]

## 2015-01-22 LAB — CULTURE, GROUP A STREP: Strep A Culture: NEGATIVE

## 2015-02-01 DIAGNOSIS — K1121 Acute sialoadenitis: Secondary | ICD-10-CM | POA: Diagnosis not present

## 2015-02-01 DIAGNOSIS — H903 Sensorineural hearing loss, bilateral: Secondary | ICD-10-CM | POA: Diagnosis not present

## 2015-02-08 DIAGNOSIS — S7222XD Displaced subtrochanteric fracture of left femur, subsequent encounter for closed fracture with routine healing: Secondary | ICD-10-CM | POA: Diagnosis not present

## 2015-03-08 DIAGNOSIS — S7222XD Displaced subtrochanteric fracture of left femur, subsequent encounter for closed fracture with routine healing: Secondary | ICD-10-CM | POA: Diagnosis not present

## 2015-04-10 DIAGNOSIS — L309 Dermatitis, unspecified: Secondary | ICD-10-CM | POA: Diagnosis not present

## 2015-05-29 DIAGNOSIS — Z1389 Encounter for screening for other disorder: Secondary | ICD-10-CM | POA: Diagnosis not present

## 2015-05-29 DIAGNOSIS — S72132D Displaced apophyseal fracture of left femur, subsequent encounter for closed fracture with routine healing: Secondary | ICD-10-CM | POA: Diagnosis not present

## 2015-05-29 DIAGNOSIS — E039 Hypothyroidism, unspecified: Secondary | ICD-10-CM | POA: Diagnosis not present

## 2015-05-29 DIAGNOSIS — Z6826 Body mass index (BMI) 26.0-26.9, adult: Secondary | ICD-10-CM | POA: Diagnosis not present

## 2015-05-29 DIAGNOSIS — N400 Enlarged prostate without lower urinary tract symptoms: Secondary | ICD-10-CM | POA: Diagnosis not present

## 2015-05-29 DIAGNOSIS — R300 Dysuria: Secondary | ICD-10-CM | POA: Diagnosis not present

## 2015-05-29 DIAGNOSIS — I10 Essential (primary) hypertension: Secondary | ICD-10-CM | POA: Diagnosis not present

## 2015-05-29 DIAGNOSIS — Z23 Encounter for immunization: Secondary | ICD-10-CM | POA: Diagnosis not present

## 2015-05-29 DIAGNOSIS — E782 Mixed hyperlipidemia: Secondary | ICD-10-CM | POA: Diagnosis not present

## 2015-05-29 DIAGNOSIS — E119 Type 2 diabetes mellitus without complications: Secondary | ICD-10-CM | POA: Diagnosis not present

## 2015-05-29 DIAGNOSIS — R351 Nocturia: Secondary | ICD-10-CM | POA: Diagnosis not present

## 2015-06-05 DIAGNOSIS — I700 Atherosclerosis of aorta: Secondary | ICD-10-CM | POA: Diagnosis not present

## 2015-06-05 DIAGNOSIS — I77811 Abdominal aortic ectasia: Secondary | ICD-10-CM | POA: Diagnosis not present

## 2015-06-08 DIAGNOSIS — L259 Unspecified contact dermatitis, unspecified cause: Secondary | ICD-10-CM | POA: Diagnosis not present

## 2015-06-08 DIAGNOSIS — L309 Dermatitis, unspecified: Secondary | ICD-10-CM | POA: Diagnosis not present

## 2015-06-16 DIAGNOSIS — I77811 Abdominal aortic ectasia: Secondary | ICD-10-CM | POA: Diagnosis not present

## 2015-06-16 DIAGNOSIS — N200 Calculus of kidney: Secondary | ICD-10-CM | POA: Diagnosis not present

## 2015-06-27 ENCOUNTER — Ambulatory Visit (INDEPENDENT_AMBULATORY_CARE_PROVIDER_SITE_OTHER): Payer: Medicare Other | Admitting: Urology

## 2015-06-27 ENCOUNTER — Other Ambulatory Visit: Payer: Self-pay | Admitting: Urology

## 2015-06-27 DIAGNOSIS — N401 Enlarged prostate with lower urinary tract symptoms: Secondary | ICD-10-CM | POA: Diagnosis not present

## 2015-06-27 DIAGNOSIS — R972 Elevated prostate specific antigen [PSA]: Secondary | ICD-10-CM

## 2015-07-25 ENCOUNTER — Ambulatory Visit (HOSPITAL_COMMUNITY)
Admission: RE | Admit: 2015-07-25 | Discharge: 2015-07-25 | Disposition: A | Discharge status: Home / Self Care | Payer: Medicare Other | Source: Ambulatory Visit | Attending: Urology | Admitting: Urology

## 2015-07-25 DIAGNOSIS — R972 Elevated prostate specific antigen [PSA]: Secondary | ICD-10-CM | POA: Insufficient documentation

## 2015-07-25 DIAGNOSIS — C61 Malignant neoplasm of prostate: Secondary | ICD-10-CM | POA: Insufficient documentation

## 2015-07-25 DIAGNOSIS — D075 Carcinoma in situ of prostate: Secondary | ICD-10-CM | POA: Diagnosis not present

## 2015-07-25 MED ORDER — LIDOCAINE HCL (PF) 2 % IJ SOLN
10.0000 mL | Freq: Once | INTRAMUSCULAR | Status: AC
  Administered 2015-07-25: 10 mL

## 2015-07-25 MED ORDER — GENTAMICIN SULFATE 40 MG/ML IJ SOLN
160.0000 mg | Freq: Once | INTRAMUSCULAR | Status: AC
  Administered 2015-07-25: 160 mg via INTRAMUSCULAR

## 2015-07-25 NOTE — Discharge Instructions (Signed)
Transrectal Ultrasound-Guided Biopsy °A transrectal ultrasound-guided biopsy is a procedure to take samples of tissue from your prostate. Ultrasound images are used to guide the procedure. It is usually done to check the prostate gland for cancer. °BEFORE THE PROCEDURE °· Do not eat or drink after midnight on the night before your procedure. °· Take medicines as your doctor tells you. °· Your doctor may have you stop taking some medicines 5-7 days before the procedure. °· You will be given an enema before your procedure. During an enema, a liquid is put into your butt (rectum) to clear out waste. °· You may have lab tests the day of your procedure. °· Make plans to have someone drive you home. °PROCEDURE °· You will be given medicine to help you relax before the procedure. An IV tube will be put into one of your veins. It will be used to give fluids and medicine. °· You will be given medicine to reduce the risk of infection (antibiotic). °· You will be placed on your side. °· A probe with gel will be put in your butt. This is used to take pictures of your prostate and the area around it. °· A medicine to numb the area is put into your prostate. °· A biopsy needle is then inserted and guided to your prostate. °· Samples of prostate tissue are taken. The needle is removed. °· The samples are sent to a lab to be checked. Results are usually back in 2-3 days. °AFTER THE PROCEDURE °· You will be taken to a room where you will be watched until you are doing okay. °· You may have some pain in the area around your butt. You will be given medicines for this. °· You may be able to go home the same day. Sometimes, an overnight stay in the hospital is needed. °  °This information is not intended to replace advice given to you by your health care provider. Make sure you discuss any questions you have with your health care provider. °  °Document Released: 03/27/2009 Document Revised: 04/13/2013 Document Reviewed:  11/25/2012 °Elsevier Interactive Patient Education ©2016 [employer] ° °

## 2015-08-14 DIAGNOSIS — N401 Enlarged prostate with lower urinary tract symptoms: Secondary | ICD-10-CM | POA: Diagnosis not present

## 2015-08-14 DIAGNOSIS — C61 Malignant neoplasm of prostate: Secondary | ICD-10-CM | POA: Diagnosis not present

## 2015-08-14 DIAGNOSIS — N138 Other obstructive and reflux uropathy: Secondary | ICD-10-CM | POA: Diagnosis not present

## 2015-08-18 DIAGNOSIS — I8393 Asymptomatic varicose veins of bilateral lower extremities: Secondary | ICD-10-CM | POA: Diagnosis not present

## 2015-08-18 DIAGNOSIS — C61 Malignant neoplasm of prostate: Secondary | ICD-10-CM | POA: Diagnosis not present

## 2015-08-18 DIAGNOSIS — I10 Essential (primary) hypertension: Secondary | ICD-10-CM | POA: Diagnosis not present

## 2015-08-18 DIAGNOSIS — Z0001 Encounter for general adult medical examination with abnormal findings: Secondary | ICD-10-CM | POA: Diagnosis not present

## 2015-08-18 DIAGNOSIS — K219 Gastro-esophageal reflux disease without esophagitis: Secondary | ICD-10-CM | POA: Diagnosis not present

## 2015-08-18 DIAGNOSIS — Z6826 Body mass index (BMI) 26.0-26.9, adult: Secondary | ICD-10-CM | POA: Diagnosis not present

## 2015-08-18 DIAGNOSIS — B351 Tinea unguium: Secondary | ICD-10-CM | POA: Diagnosis not present

## 2015-08-18 DIAGNOSIS — E119 Type 2 diabetes mellitus without complications: Secondary | ICD-10-CM | POA: Diagnosis not present

## 2015-08-29 DIAGNOSIS — C61 Malignant neoplasm of prostate: Secondary | ICD-10-CM | POA: Diagnosis not present

## 2016-01-30 ENCOUNTER — Ambulatory Visit (INDEPENDENT_AMBULATORY_CARE_PROVIDER_SITE_OTHER): Payer: Medicare Other | Admitting: Urology

## 2016-01-30 DIAGNOSIS — N401 Enlarged prostate with lower urinary tract symptoms: Secondary | ICD-10-CM | POA: Diagnosis not present

## 2016-01-30 DIAGNOSIS — C61 Malignant neoplasm of prostate: Secondary | ICD-10-CM | POA: Diagnosis not present

## 2016-01-30 DIAGNOSIS — N509 Disorder of male genital organs, unspecified: Secondary | ICD-10-CM

## 2016-01-30 DIAGNOSIS — N5201 Erectile dysfunction due to arterial insufficiency: Secondary | ICD-10-CM

## 2016-02-27 DIAGNOSIS — Z1389 Encounter for screening for other disorder: Secondary | ICD-10-CM | POA: Diagnosis not present

## 2016-02-27 DIAGNOSIS — I10 Essential (primary) hypertension: Secondary | ICD-10-CM | POA: Diagnosis not present

## 2016-02-27 DIAGNOSIS — Z6827 Body mass index (BMI) 27.0-27.9, adult: Secondary | ICD-10-CM | POA: Diagnosis not present

## 2016-02-27 DIAGNOSIS — R1011 Right upper quadrant pain: Secondary | ICD-10-CM | POA: Diagnosis not present

## 2016-02-27 DIAGNOSIS — E1140 Type 2 diabetes mellitus with diabetic neuropathy, unspecified: Secondary | ICD-10-CM | POA: Diagnosis not present

## 2016-02-28 DIAGNOSIS — Z6827 Body mass index (BMI) 27.0-27.9, adult: Secondary | ICD-10-CM | POA: Diagnosis not present

## 2016-02-28 DIAGNOSIS — E663 Overweight: Secondary | ICD-10-CM | POA: Diagnosis not present

## 2016-02-28 DIAGNOSIS — E748 Other specified disorders of carbohydrate metabolism: Secondary | ICD-10-CM | POA: Diagnosis not present

## 2016-02-28 DIAGNOSIS — Z1389 Encounter for screening for other disorder: Secondary | ICD-10-CM | POA: Diagnosis not present

## 2016-02-28 DIAGNOSIS — R109 Unspecified abdominal pain: Secondary | ICD-10-CM | POA: Diagnosis not present

## 2016-02-29 ENCOUNTER — Other Ambulatory Visit (HOSPITAL_COMMUNITY): Payer: Self-pay | Admitting: Internal Medicine

## 2016-02-29 DIAGNOSIS — G44309 Post-traumatic headache, unspecified, not intractable: Secondary | ICD-10-CM

## 2016-03-01 ENCOUNTER — Other Ambulatory Visit (HOSPITAL_COMMUNITY): Payer: Self-pay | Admitting: Internal Medicine

## 2016-03-01 DIAGNOSIS — R109 Unspecified abdominal pain: Secondary | ICD-10-CM

## 2016-03-01 DIAGNOSIS — E663 Overweight: Secondary | ICD-10-CM

## 2016-03-02 DIAGNOSIS — Z1211 Encounter for screening for malignant neoplasm of colon: Secondary | ICD-10-CM | POA: Diagnosis not present

## 2016-03-04 DIAGNOSIS — H524 Presbyopia: Secondary | ICD-10-CM | POA: Diagnosis not present

## 2016-03-04 DIAGNOSIS — H2513 Age-related nuclear cataract, bilateral: Secondary | ICD-10-CM | POA: Diagnosis not present

## 2016-03-04 DIAGNOSIS — H25043 Posterior subcapsular polar age-related cataract, bilateral: Secondary | ICD-10-CM | POA: Diagnosis not present

## 2016-03-04 DIAGNOSIS — H35463 Secondary vitreoretinal degeneration, bilateral: Secondary | ICD-10-CM | POA: Diagnosis not present

## 2016-03-04 DIAGNOSIS — I10 Essential (primary) hypertension: Secondary | ICD-10-CM | POA: Diagnosis not present

## 2016-03-04 DIAGNOSIS — E119 Type 2 diabetes mellitus without complications: Secondary | ICD-10-CM | POA: Diagnosis not present

## 2016-03-04 DIAGNOSIS — Z7984 Long term (current) use of oral hypoglycemic drugs: Secondary | ICD-10-CM | POA: Diagnosis not present

## 2016-03-04 DIAGNOSIS — D2311 Other benign neoplasm of skin of right eyelid, including canthus: Secondary | ICD-10-CM | POA: Diagnosis not present

## 2016-03-04 DIAGNOSIS — H40013 Open angle with borderline findings, low risk, bilateral: Secondary | ICD-10-CM | POA: Diagnosis not present

## 2016-03-04 DIAGNOSIS — H43813 Vitreous degeneration, bilateral: Secondary | ICD-10-CM | POA: Diagnosis not present

## 2016-03-04 DIAGNOSIS — H35033 Hypertensive retinopathy, bilateral: Secondary | ICD-10-CM | POA: Diagnosis not present

## 2016-03-05 ENCOUNTER — Encounter (INDEPENDENT_AMBULATORY_CARE_PROVIDER_SITE_OTHER): Payer: Self-pay

## 2016-03-05 ENCOUNTER — Encounter (INDEPENDENT_AMBULATORY_CARE_PROVIDER_SITE_OTHER): Payer: Self-pay | Admitting: Internal Medicine

## 2016-03-06 ENCOUNTER — Ambulatory Visit (HOSPITAL_COMMUNITY)
Admission: RE | Admit: 2016-03-06 | Discharge: 2016-03-06 | Disposition: A | Discharge status: Home / Self Care | Payer: Medicare Other | Source: Ambulatory Visit | Attending: Internal Medicine | Admitting: Internal Medicine

## 2016-03-06 DIAGNOSIS — G44309 Post-traumatic headache, unspecified, not intractable: Secondary | ICD-10-CM

## 2016-03-06 DIAGNOSIS — I6782 Cerebral ischemia: Secondary | ICD-10-CM | POA: Insufficient documentation

## 2016-03-06 DIAGNOSIS — G319 Degenerative disease of nervous system, unspecified: Secondary | ICD-10-CM | POA: Insufficient documentation

## 2016-03-06 DIAGNOSIS — R51 Headache: Secondary | ICD-10-CM | POA: Insufficient documentation

## 2016-03-07 ENCOUNTER — Encounter (INDEPENDENT_AMBULATORY_CARE_PROVIDER_SITE_OTHER): Payer: Self-pay

## 2016-03-13 ENCOUNTER — Encounter (INDEPENDENT_AMBULATORY_CARE_PROVIDER_SITE_OTHER): Payer: Self-pay | Admitting: Internal Medicine

## 2016-03-13 ENCOUNTER — Other Ambulatory Visit (INDEPENDENT_AMBULATORY_CARE_PROVIDER_SITE_OTHER): Payer: Self-pay | Admitting: Internal Medicine

## 2016-03-13 ENCOUNTER — Telehealth (INDEPENDENT_AMBULATORY_CARE_PROVIDER_SITE_OTHER): Payer: Self-pay | Admitting: *Deleted

## 2016-03-13 ENCOUNTER — Ambulatory Visit (INDEPENDENT_AMBULATORY_CARE_PROVIDER_SITE_OTHER): Payer: Medicare Other | Admitting: Internal Medicine

## 2016-03-13 ENCOUNTER — Encounter (INDEPENDENT_AMBULATORY_CARE_PROVIDER_SITE_OTHER): Payer: Self-pay | Admitting: *Deleted

## 2016-03-13 VITALS — BP 108/84 | HR 64 | Temp 98.3°F | Ht 74.0 in | Wt 217.1 lb

## 2016-03-13 DIAGNOSIS — Z1211 Encounter for screening for malignant neoplasm of colon: Secondary | ICD-10-CM

## 2016-03-13 DIAGNOSIS — R1030 Lower abdominal pain, unspecified: Secondary | ICD-10-CM | POA: Diagnosis not present

## 2016-03-13 MED ORDER — PEG 3350-KCL-NA BICARB-NACL 420 G PO SOLR: 4000.0000 mL | Freq: Once | ORAL | 0 refills | Status: AC

## 2016-03-13 NOTE — Telephone Encounter (Signed)
Patient needs trilyte 

## 2016-03-13 NOTE — Progress Notes (Signed)
   Subjective:    Patient ID: Steven Boyd, male    DOB: [birth year], 75 y.o.   MRN: [id number]  HPI Referred by Dr. Gerarda Fraction for abdominal pain . Patient is due for a colonoscopy.  Last colonoscopy was about 10 yrs ago. Colonoscopy 2007 Dr. Arnoldo Morale; A few small diverticulosis in sigmoid colon. Small polyp.  Biopsy Hyperplastic. He tells me he is doing okay. His appetite is good. No weight loss. Sometimes he has mid to lower abdominal pain and back pain.  May have pain 3-4 times a weeks. He thinks it may be gas. If he takes a Zantac pain goes away. He has a BM daily. No melena or BRRB.    06/16/2015 US abdomen: abdominal pain: Bilateral non obstructing renal calculi. Mild ectasia of the proximal abdominal aorta measuring 3.1cm. \   02/28/2016 H and H 16.2 and 45.3.   Review of Systems Past Medical History:  Diagnosis Date  . Diabetes mellitus without complication (HCC)    diet controlled  . Hard of hearing    bilateral, no hearing aids  . Hypertension     Past Surgical History:  Procedure Laterality Date  . FEMUR IM NAIL Left 12/24/2014   Procedure: INTRAMEDULLARY (IM) NAIL FEMORAL;  Surgeon: Marchia Bond, MD;  Location: [place];  Service: Orthopedics;  Laterality: Left;    Allergies  Allergen Reactions  . Penicillins Itching    Patient states he is not allergic to penicillin    Current Outpatient Prescriptions on File Prior to Visit  Medication Sig Dispense Refill  . atenolol (TENORMIN) 50 MG tablet Take 50 mg by mouth daily.    Marland Kitchen lisinopril (PRINIVIL,ZESTRIL) 40 MG tablet Take 40 mg by mouth 2 (two) times daily.     Marland Kitchen amLODipine (NORVASC) 5 MG tablet Take 1 tablet (5 mg total) by mouth daily. 30 tablet 0   No current facility-administered medications on file prior to visit.        Objective:   Physical Exam Blood pressure 108/84, pulse 64, temperature 98.3 F (36.8 C), height 6\' 2"  (1.88 m), weight 217 lb 1.6 oz (98.5 kg). Alert and oriented. Skin warm and dry.  Oral mucosa is moist.   . Sclera anicteric, conjunctivae is pink. Thyroid not enlarged. No cervical lymphadenopathy. Lungs clear. Heart regular rate and rhythm.  Abdomen is soft. Bowel sounds are positive. No hepatomegaly. No abdominal masses felt. No tenderness.  No edema to lower extremities.          Assessment & Plan:  Sporadic abdominal pain relieved with Zantac.   Screening colonoscopy: The risks and benefits such as perforation, bleeding, and infection were reviewed with the patient and is agreeable.

## 2016-03-13 NOTE — Patient Instructions (Signed)
Colonoscopy.  The risks and benefits such as perforation, bleeding, and infection were reviewed with the patient and is agreeable. 

## 2016-03-18 ENCOUNTER — Encounter (INDEPENDENT_AMBULATORY_CARE_PROVIDER_SITE_OTHER): Payer: Self-pay

## 2016-03-18 DIAGNOSIS — Z1211 Encounter for screening for malignant neoplasm of colon: Secondary | ICD-10-CM | POA: Insufficient documentation

## 2016-04-01 DIAGNOSIS — H25043 Posterior subcapsular polar age-related cataract, bilateral: Secondary | ICD-10-CM | POA: Diagnosis not present

## 2016-04-01 DIAGNOSIS — H2513 Age-related nuclear cataract, bilateral: Secondary | ICD-10-CM | POA: Diagnosis not present

## 2016-04-01 DIAGNOSIS — H40011 Open angle with borderline findings, low risk, right eye: Secondary | ICD-10-CM | POA: Diagnosis not present

## 2016-04-01 DIAGNOSIS — D2311 Other benign neoplasm of skin of right eyelid, including canthus: Secondary | ICD-10-CM | POA: Diagnosis not present

## 2016-04-01 DIAGNOSIS — H40012 Open angle with borderline findings, low risk, left eye: Secondary | ICD-10-CM | POA: Diagnosis not present

## 2016-04-18 ENCOUNTER — Encounter (HOSPITAL_COMMUNITY): Payer: Self-pay

## 2016-04-18 ENCOUNTER — Encounter (HOSPITAL_COMMUNITY)
Admission: RE | Admit: 2016-04-18 | Discharge: 2016-04-18 | Disposition: A | Discharge status: Home / Self Care | Payer: Medicare Other | Source: Ambulatory Visit | Attending: Internal Medicine | Admitting: Internal Medicine

## 2016-04-18 DIAGNOSIS — E663 Overweight: Secondary | ICD-10-CM

## 2016-04-18 DIAGNOSIS — R109 Unspecified abdominal pain: Secondary | ICD-10-CM

## 2016-04-18 MED ORDER — TECHNETIUM TC 99M MEBROFENIN IV KIT
5.0000 | PACK | Freq: Once | INTRAVENOUS | Status: AC | PRN
  Administered 2016-04-18: 5.4 via INTRAVENOUS

## 2016-04-18 MED ORDER — SODIUM CHLORIDE 0.9% FLUSH
INTRAVENOUS | Status: AC
  Filled 2016-04-18: qty 150

## 2016-05-06 DIAGNOSIS — H2513 Age-related nuclear cataract, bilateral: Secondary | ICD-10-CM | POA: Diagnosis not present

## 2016-05-06 DIAGNOSIS — H35371 Puckering of macula, right eye: Secondary | ICD-10-CM | POA: Diagnosis not present

## 2016-05-06 DIAGNOSIS — H40013 Open angle with borderline findings, low risk, bilateral: Secondary | ICD-10-CM | POA: Diagnosis not present

## 2016-05-06 DIAGNOSIS — E119 Type 2 diabetes mellitus without complications: Secondary | ICD-10-CM | POA: Diagnosis not present

## 2016-05-06 DIAGNOSIS — H25013 Cortical age-related cataract, bilateral: Secondary | ICD-10-CM | POA: Diagnosis not present

## 2016-05-08 ENCOUNTER — Encounter (HOSPITAL_COMMUNITY)
Admission: RE | Disposition: A | Discharge status: Home / Self Care | Payer: Self-pay | Source: Ambulatory Visit | Attending: Internal Medicine

## 2016-05-08 ENCOUNTER — Encounter (HOSPITAL_COMMUNITY): Payer: Self-pay | Admitting: *Deleted

## 2016-05-08 ENCOUNTER — Ambulatory Visit (HOSPITAL_COMMUNITY)
Admission: RE | Admit: 2016-05-08 | Discharge: 2016-05-08 | Disposition: A | Discharge status: Home / Self Care | Payer: Medicare Other | Source: Ambulatory Visit | Attending: Internal Medicine | Admitting: Internal Medicine

## 2016-05-08 DIAGNOSIS — K5730 Diverticulosis of large intestine without perforation or abscess without bleeding: Secondary | ICD-10-CM | POA: Diagnosis not present

## 2016-05-08 DIAGNOSIS — D125 Benign neoplasm of sigmoid colon: Secondary | ICD-10-CM | POA: Insufficient documentation

## 2016-05-08 DIAGNOSIS — F17210 Nicotine dependence, cigarettes, uncomplicated: Secondary | ICD-10-CM | POA: Diagnosis not present

## 2016-05-08 DIAGNOSIS — Z1211 Encounter for screening for malignant neoplasm of colon: Secondary | ICD-10-CM | POA: Diagnosis not present

## 2016-05-08 DIAGNOSIS — Z7982 Long term (current) use of aspirin: Secondary | ICD-10-CM | POA: Diagnosis not present

## 2016-05-08 DIAGNOSIS — E119 Type 2 diabetes mellitus without complications: Secondary | ICD-10-CM | POA: Insufficient documentation

## 2016-05-08 DIAGNOSIS — Z79899 Other long term (current) drug therapy: Secondary | ICD-10-CM | POA: Insufficient documentation

## 2016-05-08 DIAGNOSIS — Z8546 Personal history of malignant neoplasm of prostate: Secondary | ICD-10-CM | POA: Diagnosis not present

## 2016-05-08 DIAGNOSIS — I10 Essential (primary) hypertension: Secondary | ICD-10-CM | POA: Insufficient documentation

## 2016-05-08 DIAGNOSIS — K648 Other hemorrhoids: Secondary | ICD-10-CM | POA: Insufficient documentation

## 2016-05-08 HISTORY — PX: POLYPECTOMY: SHX5525

## 2016-05-08 HISTORY — PX: COLONOSCOPY: SHX5424

## 2016-05-08 LAB — GLUCOSE, CAPILLARY: Glucose-Capillary: 93 mg/dL (ref 65–99)

## 2016-05-08 SURGERY — COLONOSCOPY
Anesthesia: Moderate Sedation

## 2016-05-08 MED ORDER — MIDAZOLAM HCL 5 MG/5ML IJ SOLN
INTRAMUSCULAR | Status: DC | PRN
  Administered 2016-05-08 (×3): 2 mg via INTRAVENOUS

## 2016-05-08 MED ORDER — MEPERIDINE HCL 50 MG/ML IJ SOLN
INTRAMUSCULAR | Status: DC | PRN
  Administered 2016-05-08 (×2): 25 mg via INTRAVENOUS

## 2016-05-08 MED ORDER — MEPERIDINE HCL 50 MG/ML IJ SOLN
INTRAMUSCULAR | Status: DC
Start: 2016-05-08 — End: 2016-05-08
  Filled 2016-05-08: qty 1

## 2016-05-08 MED ORDER — SODIUM CHLORIDE 0.9 % IV SOLN
INTRAVENOUS | Status: DC
  Administered 2016-05-08: 13:00:00 via INTRAVENOUS

## 2016-05-08 MED ORDER — MIDAZOLAM HCL 5 MG/5ML IJ SOLN
INTRAMUSCULAR | Status: AC
  Filled 2016-05-08: qty 10

## 2016-05-08 NOTE — H&P (Signed)
Steven Boyd is an 76 y.o. male.   Chief Complaint: Patient is here for colonoscopy. HPI: 76 year old Caucasian male was in for screening colonoscopy. He denies abdominal pain change in bowel habits rectal bleeding. Last colonoscopy was in 2007 by Dr. Arnoldo Morale with removal of single small polyp and was hyperplastic. Patient was noted to be in atrial fibrillation/flutter in preop. Patient denies chest pain or shortness of breath. There is no history of arrhythmia or heart disease. Family history is negative for CRC.  Past Medical History:  Diagnosis Date  . Diabetes mellitus without complication (HCC)    diet controlled  . Hard of hearing    bilateral, no hearing aids  . Hypertension   . Prostate cancer [facility])     Past Surgical History:  Procedure Laterality Date  . FEMUR IM NAIL Left 12/24/2014   Procedure: INTRAMEDULLARY (IM) NAIL FEMORAL;  Surgeon: Marchia Bond, MD;  Location: [place];  Service: Orthopedics;  Laterality: Left;    History reviewed. No pertinent family history. Social History:  reports that he has been smoking Cigarettes.  He has a 55.00 pack-year smoking history. He has never used smokeless tobacco. He reports that he does not drink alcohol or use drugs.  Allergies: No Known Allergies  Medications Prior to Admission  Medication Sig Dispense Refill  . aspirin EC 81 MG tablet Take 81 mg by mouth daily.    Marland Kitchen atenolol (TENORMIN) 50 MG tablet Take 50 mg by mouth daily.    Marland Kitchen lisinopril (PRINIVIL,ZESTRIL) 20 MG tablet Take 40 mg by mouth 2 (two) times daily.    . Omega-3 Fatty Acids (FISH OIL) 1200 MG CAPS Take 1,200 mg by mouth daily.     . tamsulosin (FLOMAX) 0.4 MG CAPS capsule Take 0.4 mg by mouth.      Results for orders placed or performed during the hospital encounter of 05/08/16 (from the past 48 hour(s))  Glucose, capillary     Status: None   Collection Time: 05/08/16  1:20 PM  Result Value Ref Range   Glucose-Capillary 93 65 - 99 mg/dL   No results  found.  ROS  Blood pressure (!) 153/87, pulse 69, temperature 98.4 F (36.9 C), temperature source Oral, resp. rate 17, height 6\' 2"  (1.88 m), weight 220 lb (99.8 kg), SpO2 95 %. Physical Exam  Constitutional: He appears well-developed and well-nourished.  HENT:  Mouth/Throat: Oropharynx is clear and moist.  Eyes: Conjunctivae are normal. No scleral icterus.  Neck: No thyromegaly present.  Cardiovascular:   Irregular rhythm normal S1 and S2. No murmur or gallop noted.  Respiratory: Effort normal and breath sounds normal.  GI: Soft. He exhibits no distension and no mass. There is no tenderness.  Musculoskeletal: He exhibits no edema.  Lymphadenopathy:    He has no cervical adenopathy.  Neurological: He is alert.  Skin: Skin is warm and dry.     Assessment/Plan Average risk screening colonoscopy. Please note patient is in A. Fib/A flutter with controlled rate(new onset). Will arrange for cardiology consultation.  Hildred Laser, MD 05/08/2016, 1:48 PM

## 2016-05-08 NOTE — Discharge Instructions (Signed)
Resume usual medications including aspirin at usual dose. High fiber diet. No driving for 24 hours. Physician will call with biopsy results.  Follow-up with Dr. Gerarda Fraction regarding new onset of atrial fibrillation/atrial flutter. Return to emergency room if you experience chest pain shortness of breath lightheadedness or rapid heartbeat.  Colon Polyps Introduction Polyps are tissue growths inside the body. Polyps can grow in many places, including the large intestine (colon). A polyp may be a round bump or a mushroom-shaped growth. You could have one polyp or several. Most colon polyps are noncancerous (benign). However, some colon polyps can become cancerous over time. What are the causes? The exact cause of colon polyps is not known. What increases the risk? This condition is more likely to develop in people who:  Have a family history of colon cancer or colon polyps.  Are older than 28 or older than 45 if they are African American.  Have inflammatory bowel disease, such as ulcerative colitis or Crohn disease.  Are overweight.  Smoke cigarettes.  Do not get enough exercise.  Drink too much alcohol.  Eat a diet that is:  High in fat and red meat.  Low in fiber.  Had childhood cancer that was treated with abdominal radiation. What are the signs or symptoms? Most polyps do not cause symptoms. If you have symptoms, they may include:  Blood coming from your rectum when having a bowel movement.  Blood in your stool.The stool may look dark red or black.  A change in bowel habits, such as constipation or diarrhea. How is this diagnosed? This condition is diagnosed with a colonoscopy. This is a procedure that uses a lighted, flexible scope to look at the inside of your colon. How is this treated? Treatment for this condition involves removing any polyps that are found. Those polyps will then be tested for cancer. If cancer is found, your health care provider will talk to you  about options for colon cancer treatment. Follow these instructions at home: Diet  Eat plenty of fiber, such as fruits, vegetables, and whole grains.  Eat foods that are high in calcium and vitamin D, such as milk, cheese, yogurt, eggs, liver, fish, and broccoli.  Limit foods high in fat, red meats, and processed meats, such as hot dogs, sausage, bacon, and lunch meats.  Maintain a healthy weight, or lose weight if recommended by your health care provider. General instructions  Do not smoke cigarettes.  Do not drink alcohol excessively.  Keep all follow-up visits as told by your health care provider. This is important. This includes keeping regularly scheduled colonoscopies. Talk to your health care provider about when you need a colonoscopy.  Exercise every day or as told by your health care provider. Contact a health care provider if:  You have new or worsening bleeding during a bowel movement.  You have new or increased blood in your stool.  You have a change in bowel habits.  You unexpectedly lose weight. This information is not intended to replace advice given to you by your health care provider. Make sure you discuss any questions you have with your health care provider. Document Released: 01/03/2004 Document Revised: 09/14/2015 Document Reviewed: 02/27/2015  2017 Elsevier    Colonoscopy, Adult, Care After This sheet gives you information about how to care for yourself after your procedure. Your health care provider may also give you more specific instructions. If you have problems or questions, contact your health care provider. What can I expect after the procedure?  After the procedure, it is common to have:  A small amount of blood in your stool for 24 hours after the procedure.  Some gas.  Mild abdominal cramping or bloating. Follow these instructions at home: General instructions  For the first 24 hours after the procedure:  Do not drive or use  machinery.  Do not sign important documents.  Do not drink alcohol.  Do your regular daily activities at a slower pace than normal.  Eat soft, easy-to-digest foods.  Rest often.  Take over-the-counter or prescription medicines only as told by your health care provider.  It is up to you to get the results of your procedure. Ask your health care provider, or the department performing the procedure, when your results will be ready. Relieving cramping and bloating  Try walking around when you have cramps or feel bloated.  Apply heat to your abdomen as told by your health care provider. Use a heat source that your health care provider recommends, such as a moist heat pack or a heating pad.  Place a towel between your skin and the heat source.  Leave the heat on for 20-30 minutes.  Remove the heat if your skin turns bright red. This is especially important if you are unable to feel pain, heat, or cold. You may have a greater risk of getting burned. Eating and drinking  Drink enough fluid to keep your urine clear or pale yellow.  Resume your normal diet as instructed by your health care provider. Avoid heavy or fried foods that are hard to digest.  Avoid drinking alcohol for as long as instructed by your health care provider. Contact a health care provider if:  You have blood in your stool 2-3 days after the procedure. Get help right away if:  You have more than a small spotting of blood in your stool.  You pass large blood clots in your stool.  Your abdomen is swollen.  You have nausea or vomiting.  You have a fever.  You have increasing abdominal pain that is not relieved with medicine. This information is not intended to replace advice given to you by your health care provider. Make sure you discuss any questions you have with your health care provider. Document Released: 11/21/2003 Document Revised: 01/01/2016 Document Reviewed: 06/20/2015 Elsevier Interactive Patient  Education  2017 [employer].

## 2016-05-08 NOTE — Op Note (Signed)
[facility] Patient Name: Steven Boyd Procedure Date: 05/08/2016 11:22 AM MRN: [id number] Date of Birth: 03-06-1941 Attending MD: Hildred Laser , MD CSN: [id number] Age: 76 Admit Type: Outpatient Procedure:                Colonoscopy Indications:              Screening for colorectal malignant neoplasm Providers:                Hildred Laser, MD, Rosina Lowenstein, RN, Lurline Del,                            RN, Purcell Nails. [place], [employer] Referring MD:             Redmond School, MD Medicines:                Meperidine 50 mg IV, Midazolam 6 mg IV Complications:            No immediate complications. Estimated Blood Loss:     Estimated blood loss was minimal. Procedure:                Pre-Anesthesia Assessment:                           - Prior to the procedure, a History and Physical                            was performed, and patient medications and                            allergies were reviewed. The patient's tolerance of                            previous anesthesia was also reviewed. The risks                            and benefits of the procedure and the sedation                            options and risks were discussed with the patient.                            All questions were answered, and informed consent                            was obtained. Prior Anticoagulants: The patient                            last took Coumadin (warfarin) 2 days prior to the                            procedure. ASA Grade Assessment: II - A patient                            with mild systemic disease. After reviewing the  risks and benefits, the patient was deemed in                            satisfactory condition to undergo the procedure.                           After obtaining informed consent, the colonoscope                            was passed under direct vision. Throughout the                            procedure, the patient's blood  pressure, pulse, and                            oxygen saturations were monitored continuously. The                            [id number]) scope was introduced through                            the anus and advanced to the the cecum, identified                            by appendiceal orifice and ileocecal valve. The                            colonoscopy was performed without difficulty. The                            patient tolerated the procedure well. The quality                            of the bowel preparation was adequate. The                            ileocecal valve, appendiceal orifice, and rectum                            were photographed. Scope In: 2:00:01 PM Scope Out: 2:21:37 PM Scope Withdrawal Time: 0 hours 12 minutes 57 seconds  Total Procedure Duration: 0 hours 21 minutes 36 seconds  Findings:      The perianal and digital rectal examinations were normal.      A 5 mm polyp was found in the sigmoid colon. The polyp was sessile. The       polyp was removed with a cold snare. Resection and retrieval were       complete. The pathology specimen was placed into Bottle Number 1.      Two sessile polyps were found in the sigmoid colon. The polyps were       small in size. These were biopsied with a cold forceps for histology.       The pathology specimen was placed into Bottle Number 1.      A few medium-mouthed diverticula were found in the sigmoid  colon.      Internal hemorrhoids were found during retroflexion. The hemorrhoids       were small. Impression:               - One 5 mm polyp in the sigmoid colon, removed with                            a cold snare. Resected and retrieved.                           - Two small polyps in the sigmoid colon. Biopsied.                           - Diverticulosis in the sigmoid colon.                           - Internal hemorrhoids. Moderate Sedation:      Moderate (conscious) sedation was administered by the  endoscopy nurse       and supervised by the endoscopist. The following parameters were       monitored: oxygen saturation, heart rate, blood pressure, CO2       capnography and response to care. Total physician intraservice time was       28 minutes. Recommendation:           - Patient has a contact number available for                            emergencies. The signs and symptoms of potential                            delayed complications were discussed with the                            patient. Return to normal activities tomorrow.                            Written discharge instructions were provided to the                            patient.                           - High fiber diet today.                           - Continue present medications.                           - Resume aspirin at prior dose today.                           - Await pathology results.                           - Repeat colonoscopy date to be determined after  pending pathology results are reviewed.                           - Return to primary care physician tomorrowfor                            evaluation of new onset of atrial fib/flutter. Procedure Code(s):        --- Professional ---                           [phone], Colonoscopy, flexible; with removal of                            tumor(s), polyp(s), or other lesion(s) by snare                            technique                           45380, 59, Colonoscopy, flexible; with biopsy,                            single or multiple                           99152, Moderate sedation services provided by the                            same physician or other qualified health care                            professional performing the diagnostic or                            therapeutic service that the sedation supports,                            requiring the presence of an independent trained                             observer to assist in the monitoring of the                            patient's level of consciousness and physiological                            status; initial 15 minutes of intraservice time,                            patient age 33 years or older                           [id number], Moderate sedation services; each additional  15 minutes intraservice time Diagnosis Code(s):        --- Professional ---                           D12.5, Benign neoplasm of sigmoid colon                           Z12.11, Encounter for screening for malignant                            neoplasm of colon                           K64.8, Other hemorrhoids                           K57.30, Diverticulosis of large intestine without                            perforation or abscess without bleeding CPT copyright 2016 American Medical Association. All rights reserved. The codes documented in this report are preliminary and upon coder review may  be revised to meet current compliance requirements. Hildred Laser, MD Hildred Laser, MD 05/08/2016 2:36:29 PM This report has been signed electronically. Number of Addenda: 0

## 2016-05-08 NOTE — OR Nursing (Signed)
Dr. Laural Golden notified of patient being in atrial fibrillation at a rate of 70-72. Will continue to monitor patient.

## 2016-05-10 ENCOUNTER — Telehealth: Payer: Self-pay | Admitting: Cardiovascular Disease

## 2016-05-10 ENCOUNTER — Encounter: Payer: Self-pay | Admitting: Physician Assistant

## 2016-05-10 ENCOUNTER — Ambulatory Visit (INDEPENDENT_AMBULATORY_CARE_PROVIDER_SITE_OTHER): Payer: Medicare Other | Admitting: Physician Assistant

## 2016-05-10 VITALS — BP 154/96 | HR 64 | Ht 74.0 in | Wt 218.0 lb

## 2016-05-10 DIAGNOSIS — I10 Essential (primary) hypertension: Secondary | ICD-10-CM | POA: Diagnosis not present

## 2016-05-10 DIAGNOSIS — I4891 Unspecified atrial fibrillation: Secondary | ICD-10-CM | POA: Diagnosis not present

## 2016-05-10 DIAGNOSIS — I731 Thromboangiitis obliterans [Buerger's disease]: Secondary | ICD-10-CM

## 2016-05-10 DIAGNOSIS — Z79899 Other long term (current) drug therapy: Secondary | ICD-10-CM

## 2016-05-10 DIAGNOSIS — E119 Type 2 diabetes mellitus without complications: Secondary | ICD-10-CM

## 2016-05-10 MED ORDER — RIVAROXABAN 20 MG PO TABS: 20.0000 mg | ORAL_TABLET | Freq: Every day | ORAL | 5 refills | Status: DC

## 2016-05-10 NOTE — Progress Notes (Signed)
Cardiology Office Note    Date:  05/10/2016   ID:  FISHER MEHR, DOB [birth year], MRN [id number]  PCP:  Glo Herring., MD  Cardiologist:  Previously Dr. Almira Coaster on [place] (last seen 02/28/2014)   Chief Complaint  Patient presents with  . Follow-up    new onset of afib of unknown duration found during colonscopy  . Pre-op Exam    possible gallbladder surgery by Dr. Excell Seltzer    History of Present Illness:  Steven Boyd is a 76 y.o. male with past medical history of hypertension and diet-controlled diabetes. He was evaluated by Dr. Hamilton Capri with [facility] on 02/28/2014 for abdominal discomfort. According to the note, it did appears that his symptom was quite atypical at the time however given multiple cardiac risk factors, outpatient stress echo was ordered. We requested the outside record. Stress echocardiogram done at [facility] on 03/14/2014 showed EF 60-65%, normal LV chamber dimension and wall thickness, no inducible regional wall motion abnormality with stress, moderately dilated left atrium, no ischemic EKG changes. Upsloping ST depression at peak exercise not normalized in recovery, no chest pain with exercise. He was able to achieve 8 METs cardiac workload. He just recently underwent colonoscopy however during the preop phase, it was noted he was in atrial fibrillation. He was still able to proceed with colonoscopy, this showed a 5 mm polyp with cold snare, 2 smaller polyp removed and biopsied. He has diverticulosis in the sigmoid colon and internal hemorrhoids. Otherwise, high fiber diet was recommended. He was referred to cardiology service for evaluation of new atrial fibrillation. He is also waiting for gallbladder surgery due to epigastric pain and abnormal HIDA scan.   He presents today for evaluation of atrial fibrillation. He has no cardiac awareness of atrial fibrillation. Fortunately, his heart rate is quite well controlled because he has been  on atenolol 50 mg daily. The last EKG according to the patient and the chart was in September 2016 which showed sinus rhythm at the time. We do not know the exact onset of atrial fibrillation. I have discussed with the patient regarding benefit and risk of Coumadin versus Xarelto versus eliquis. We eventually opted for Xarelto 20mg  daily as patient has Medicare and is quite concerned about the cost. He denies any exertional symptoms, he is able to ambulate and exercise without any chest discomfort or shortness of breath. He is going to get evaluated for possible cholecystectomy by general surgery on 05/16/2016, given the fact that he has no exertional symptom and he has a normal stress echocardiogram in 2015, he is cleared for surgery from cardiology perspective with instruction to hold Xarelto for 3 days prior to surgery.   Today he is accompanied by his wife, his daughter, his grandson, family is concerned about his lower extremity as well. 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 I have emphasized and the need for tobacco cessation. After discussing with Dr. Percival Spanish, he will see Dr. Gwenlyn Found next for the evaluation of lower extremity arterial flow. Interestingly, he has 2+ pulses in the right posterior tibial and the left dorsalis pedis, there is 1+ pulse in the left posterior tibial artery, I was unable to find a pulse on the dorsalis pedis artery of the right foot. His right toe is cold, however he says it is always like that for the past several years. He did have an arterial Doppler in 2011 that was  normal.    Past Medical History:  Diagnosis Date  . Diabetes mellitus without complication (HCC)    diet controlled  . Hard of hearing    bilateral, no hearing aids  . Hypertension   . Prostate cancer [facility])     Past Surgical History:  Procedure Laterality Date  . FEMUR IM NAIL Left 12/24/2014   Procedure:  INTRAMEDULLARY (IM) NAIL FEMORAL;  Surgeon: Marchia Bond, MD;  Location: [place];  Service: Orthopedics;  Laterality: Left;    Current Medications: Outpatient Medications Prior to Visit  Medication Sig Dispense Refill  . aspirin EC 81 MG tablet Take 81 mg by mouth daily.    Marland Kitchen atenolol (TENORMIN) 50 MG tablet Take 50 mg by mouth daily.    Marland Kitchen lisinopril (PRINIVIL,ZESTRIL) 20 MG tablet Take 40 mg by mouth 2 (two) times daily.    . Omega-3 Fatty Acids (FISH OIL) 1200 MG CAPS Take 1,200 mg by mouth daily.     . tamsulosin (FLOMAX) 0.4 MG CAPS capsule Take 0.4 mg by mouth.     No facility-administered medications prior to visit.      Allergies:   Patient has no known allergies.   Social History   Social History  . Marital status: Married    Spouse name: N/A  . Number of children: N/A  . Years of education: N/A   Social History Main Topics  . Smoking status: Current Every Day Smoker    Packs/day: 1.00    Years: 55.00    Types: Cigarettes  . Smokeless tobacco: Never Used  . Alcohol use No  . Drug use: No  . Sexual activity: Not Asked   Other Topics Concern  . None   Social History Narrative  . None     Family History:  The patient's family history includes Diabetes in his mother; Heart Problems in his father.   ROS:   Please see the history of present illness.    ROS All other systems reviewed and are negative.   PHYSICAL EXAM:   VS:  BP (!) 154/96 (BP Location: Right Arm, Patient Position: Sitting, Cuff Size: Normal)   Pulse 64   Ht 6\' 2"  (1.88 m)   Wt 218 lb (98.9 kg)   BMI 27.99 kg/m    GEN: Well nourished, well developed, in no acute distress  HEENT: normal  Neck: no JVD, carotid bruits, or masses Cardiac: irregularly irregular; no murmurs, rubs, or gallops,no edema  Respiratory:  clear to auscultation bilaterally, normal work of breathing GI: soft, nontender, nondistended, + BS MS: no deformity or atrophy  Skin: +dilated pinpoint vein in bilateral feet. 2+  pulses in right posterior tibial and the left dorsalis pedis, he has 1+ pulse in the left posterior tibial, I was unable to feel the pulse in the right dorsalis pedis area. Neuro:  Alert and Oriented x 3.  Psych: euthymic mood, full affect  Wt Readings from Last 3 Encounters:  05/10/16 218 lb (98.9 kg)  05/08/16 220 lb (99.8 kg)  03/13/16 217 lb 1.6 oz (98.5 kg)      Studies/Labs Reviewed:   EKG:  EKG is ordered today.  The ekg ordered today demonstrates Atrial fibrillation without any significant ST-T wave changes, heart rate 64  Recent Labs: No results found for requested labs within last 8760 hours.   Lipid Panel No results found for: CHOL, TRIG, HDL, CHOLHDL, VLDL, LDLCALC, LDLDIRECT  Additional studies/ records that were reviewed today include:   Stress echo obtained in November  2015 as mentioned in history of present illness Laboratory workup obtained in 06/11/1712 by daughter. Hemoglobin 14.9, white blood cell count 7.3, platelet 186, creatinine 1.02, sodium 139, potassium 4.9, normal AST ALT. Cholesterol 190, triglyceride 17, HDL 49, LDL 120. TSH 1.5.   LE doppler 07/03/2009 Findings: Ankle brachial indices are above one.  Triphasic wave forms are seen throughout both lower extremities.   IMPRESSION: No significant arterial occlusive disease in the lower extremities.   ASSESSMENT:    1. Atrial fibrillation, unspecified type ([place])   2. Medication management   3. Essential hypertension   4. Diet-controlled diabetes mellitus ([place])   5. Thromboangiitis obliterans (Buerger's disease) ([place])      PLAN:  In order of problems listed above:  1. Atrial fibrillation of unknown duration: No cardiac awareness. Previous TSH normal in February 2017 brought in by daughter. - This patients CHA2DS2-VASc Score and unadjusted Ischemic Stroke Rate (% per year) is equal to 3.2 % stroke rate/year from a score of 3  Above score calculated as 1 point each if present [CHF, HTN, DM,  Vascular=MI/PAD/Aortic Plaque, Age if 65-74, or Male] Above score calculated as 2 points each if present [Age > 75, or Stroke/TIA/TE] - I have discussed benefit and risk of Coumadin versus Xarelto versus eliquis. Eventually we opted for Xarelto 20 mg daily as patient was very concerned about the cost. He will check with his pharmacy the cost of Xarelto, if it is still too high, we will check eliquis.  2. Preoperative clearance for possible cholecystectomy by Dr. Excell Seltzer: He has a normal stress echo in November 2015, he continued to be active without any exertional symptoms. He is cleared from cardiology perspective without any further workup. He will need to hold his Xarelto for 3 days prior to surgery if he needs any surgery.  3. H/o Buerger's disease: He has swollen veins in both feet, his right toe is cold, however according to the patient this is normal for him for the past several years. There is no acute changes recently. Family is concerned about perfusion issues. Surprisingly he has 2+ pulses in right posterior tibial and the left dorsalis pedis, he has 1+ pulse in the left posterior tibial, I was unable to feel the pulse in the right dorsalis pedis area.  4. HTN: Blood pressure continued to be elevated, he is on maximum dose of lisinopril, he is also on 50 mg atenolol. I attempted to add an amlodipine, however he is very hesitant as sometimes his systolic blood pressure does drop to low 100 range. We will reassess on follow-up, if still high, recommend amlodipine.  5. Diet-controlled diabetes: We will defer to PCP.  History and all data above reviewed.  Patient examined.  I agree with the findings as above.  The patient was seen with Almyra Deforest.  The patient exam reveals [id number]  ,  Lungs: clear  ,  Abd: Positive bowel sounds, no rebound no guarding , Ext  Mottled  .  All available labs, radiology testing, previous records reviewed. Agree with documented assessment and plan. Agree with  anticoagulation as above.  He has no symptoms related to the fib.  Plan rate control and anticoagulation.   Jeneen Rinks Hoch[place]  4:02 PM  05/12/2016  Medication Adjustments/Labs and Tests Ordered: Current medicines are reviewed at length with the patient today.  Concerns regarding medicines are outlined above.  Medication changes, Labs and Tests ordered today are listed in the Patient Instructions below. Patient Instructions  Medication Instructions:  START XARELTO 20MG  DAILY   If you need a refill on your cardiac medications before your next appointment, please call your pharmacy.  Labwork: CBC,BMP, FASTING LIPID PANEL   Follow-Up: KEEP APPOINTMENT WITH DR Gwenlyn Found 05-29-2016 @2PM    Thank you for choosing [facility] at [facility], LPN [facility], PA-C    Signed, Almyra Deforest, [place]  05/10/2016 5:08 PM    [facility] [address], [place], [place]  [address] Phone: [phone]; Fax: [phone]

## 2016-05-10 NOTE — Telephone Encounter (Signed)
Spoke with daughter , she states that GI DOCTOR informed her that patient was afib during procedure.  Appointment schedule for today with extender . Keep 05/29/16 if necessary.  Bring medication and insurance card

## 2016-05-10 NOTE — Telephone Encounter (Signed)
New Message   Daughter Cathie Olden  She will be bringing him to his appointment 05/29/16 if this appointment needs changed or any instructions please call her.   Spoke w Dalene Seltzer earlier today

## 2016-05-10 NOTE — Patient Instructions (Signed)
Medication Instructions:  START XARELTO 20MG  DAILY   If you need a refill on your cardiac medications before your next appointment, please call your pharmacy.  Labwork: CBC,BMP, FASTING LIPID PANEL   Follow-Up: KEEP APPOINTMENT WITH DR Gwenlyn Found 05-29-2016 @2PM    Thank you for choosing [facility] at [facility]!!    Sharyn Lull, LPN HAO MENG, PA-C

## 2016-05-13 DIAGNOSIS — Z79899 Other long term (current) drug therapy: Secondary | ICD-10-CM | POA: Diagnosis not present

## 2016-05-13 LAB — BASIC METABOLIC PANEL
BUN: 11 mg/dL (ref 7–25)
CO2: 30 mmol/L (ref 20–31)
Calcium: 9.3 mg/dL (ref 8.6–10.3)
Chloride: 105 mmol/L (ref 98–110)
Creat: 1.11 mg/dL (ref 0.70–1.18)
Glucose, Bld: 95 mg/dL (ref 65–99)
POTASSIUM: 4.4 mmol/L (ref 3.5–5.3)
Sodium: 141 mmol/L (ref 135–146)

## 2016-05-13 LAB — CBC
HEMATOCRIT: 46.6 % (ref 38.5–50.0)
Hemoglobin: 15.8 g/dL (ref 13.2–17.1)
MCH: 31 pg (ref 27.0–33.0)
MCHC: 33.9 g/dL (ref 32.0–36.0)
MCV: 91.4 fL (ref 80.0–100.0)
MPV: 11 fL (ref 7.5–12.5)
Platelets: 149 10*3/uL (ref 140–400)
RBC: 5.1 MIL/uL (ref 4.20–5.80)
RDW: 13.5 % (ref 11.0–15.0)
WBC: 6 10*3/uL (ref 3.8–10.8)

## 2016-05-13 LAB — LIPID PANEL
CHOL/HDL RATIO: 3.8 ratio (ref <5.0)
Cholesterol: 161 mg/dL (ref <200)
HDL: 42 mg/dL (ref >40)
LDL CALC: 103 mg/dL — AB (ref <100)
Triglycerides: 82 mg/dL (ref <150)
VLDL: 16 mg/dL (ref <30)

## 2016-05-13 NOTE — Progress Notes (Signed)
Previous lab work reviewed, no change in current plan

## 2016-05-16 ENCOUNTER — Other Ambulatory Visit (HOSPITAL_COMMUNITY): Payer: Self-pay | Admitting: General Surgery

## 2016-05-16 DIAGNOSIS — R1013 Epigastric pain: Secondary | ICD-10-CM

## 2016-05-17 ENCOUNTER — Telehealth: Payer: Self-pay | Admitting: Physician Assistant

## 2016-05-17 NOTE — Telephone Encounter (Signed)
Spoke with wife.  They picked up 30 tabs last Friday.  Will have enough for [place] to transition patient after appt with Dr. Gwenlyn Found on Feb 7.

## 2016-05-17 NOTE — Telephone Encounter (Signed)
Spoke to patient. Note that the call I placed was in regards to recent labwork, but while speaking with wife, she raised a separate concern.  She notes patient does not have [employer] fund[employer]. He was put on Xarelto by our office for new onset A Fib, and was able to get a 30 day free card. Has enough tablets to get through to f/u w Dr. Gwenlyn Found.   When they spoke w pharmacy, they were given information for a patient assistance program. We discussed options, and I noted her concern that the assistance application process seemed very overwhelming. She notes the patient would probably be agreeable to use of warfarin instead. I did explain the expectation w this for routine monitoring. They live in [place], so if they were to pursue this option, would probably want his levels checked in [facility] office or by their PCP. Aware that I will bring to attention of pharmD to see if we can help make these arrangements.

## 2016-05-17 NOTE — Telephone Encounter (Signed)
Pt's dtr returning call regarding lab results-pls call

## 2016-05-17 NOTE — Telephone Encounter (Signed)
Patient is returning your call,thanks. °

## 2016-05-21 ENCOUNTER — Telehealth: Payer: Self-pay | Admitting: Physician Assistant

## 2016-05-21 NOTE — Telephone Encounter (Signed)
Request for surgical clearance:  1. What type of surgery is being performed? Upper endoscopy   2. When is this surgery scheduled? unknown  3. Are there any medications that need to be held prior to surgery and how long? xarelto two days before  4. Name of physician performing surgery? Dr. Laural Golden  What is your office phone and fax number? Fax [phone]

## 2016-05-21 NOTE — Telephone Encounter (Signed)
Last office note containing clearance faxed to the number provided. 

## 2016-05-22 ENCOUNTER — Encounter (HOSPITAL_COMMUNITY): Payer: Self-pay | Admitting: Internal Medicine

## 2016-05-23 ENCOUNTER — Telehealth (INDEPENDENT_AMBULATORY_CARE_PROVIDER_SITE_OTHER): Payer: Self-pay | Admitting: *Deleted

## 2016-05-23 ENCOUNTER — Encounter (INDEPENDENT_AMBULATORY_CARE_PROVIDER_SITE_OTHER): Payer: Self-pay | Admitting: *Deleted

## 2016-05-23 NOTE — Telephone Encounter (Signed)
Error   This encounter was created in error - please disregard. 

## 2016-05-23 NOTE — Telephone Encounter (Signed)
Referring MD: Hoxworth PCP: fusco   Procedure: egd  Reason/Indication:  Epigastric pain  Has patient had this procedure before?  no  If so, when, by whom and where?    Is there a family history of colon cancer?    Who?  What age when diagnosed?    Is patient diabetic?   no      Does patient have prosthetic heart valve or mechanical valve?  no  Do you have a pacemaker?  no  Has patient ever had endocarditis? no  Has patient had joint replacement within last 12 months?  no  Does patient tend to be constipated or take laxatives? no  Does patient have a history of alcohol/drug use?  no  Is patient on Coumadin, Plavix and/or Aspirin? yes  Medications: see epic  Allergies: nkda  Medication Adjustment: xarelto 2 days  Procedure date & time: 06/07/16 at 815

## 2016-05-24 ENCOUNTER — Ambulatory Visit (HOSPITAL_COMMUNITY)
Admission: RE | Admit: 2016-05-24 | Discharge: 2016-05-24 | Disposition: A | Discharge status: Home / Self Care | Payer: Medicare Other | Source: Ambulatory Visit | Attending: General Surgery | Admitting: General Surgery

## 2016-05-24 DIAGNOSIS — R1013 Epigastric pain: Secondary | ICD-10-CM | POA: Insufficient documentation

## 2016-05-27 NOTE — Telephone Encounter (Signed)
agree

## 2016-05-28 ENCOUNTER — Ambulatory Visit: Payer: Medicare Other | Admitting: Cardiovascular Disease

## 2016-05-29 ENCOUNTER — Encounter: Payer: Self-pay | Admitting: Cardiovascular Disease

## 2016-05-29 ENCOUNTER — Other Ambulatory Visit (INDEPENDENT_AMBULATORY_CARE_PROVIDER_SITE_OTHER): Payer: Self-pay | Admitting: *Deleted

## 2016-05-29 ENCOUNTER — Ambulatory Visit (INDEPENDENT_AMBULATORY_CARE_PROVIDER_SITE_OTHER): Payer: Medicare Other | Admitting: Cardiovascular Disease

## 2016-05-29 VITALS — BP 138/88 | HR 61 | Ht 74.0 in | Wt 217.0 lb

## 2016-05-29 DIAGNOSIS — I739 Peripheral vascular disease, unspecified: Secondary | ICD-10-CM

## 2016-05-29 DIAGNOSIS — R0989 Other specified symptoms and signs involving the circulatory and respiratory systems: Secondary | ICD-10-CM

## 2016-05-29 DIAGNOSIS — R1013 Epigastric pain: Secondary | ICD-10-CM | POA: Insufficient documentation

## 2016-05-29 NOTE — Assessment & Plan Note (Signed)
Mr. Sotolongo was referred to me for absent left pedal pulse. There is a question of a diagnosis of Buerger's disease. He has 60 pack years of tobacco abuse. He denies claudication. He does have a diminished left pedal pulses compared to the right. We will get lower extremity arterial Doppler studies.

## 2016-05-29 NOTE — Patient Instructions (Signed)
Medication Instructions: Your physician recommends that you continue on your current medications as directed. Please refer to the Current Medication list given to you today.   Testing/Procedures: Your physician has requested that you have a lower extremity arterial duplex. During this test, ultrasound is used to evaluate arterial blood flow in the legs. Allow one hour for this exam. There are no restrictions or special instructions.  Your physician has requested that you have an ankle brachial index (ABI). During this test an ultrasound and blood pressure cuff are used to evaluate the arteries that supply the arms and legs with blood. Allow thirty minutes for this exam. There are no restrictions or special instructions.  Follow-Up: Your physician recommends that you schedule a follow-up appointment as needed with Dr. Berry.  If you need a refill on your cardiac medications before your next appointment, please call your pharmacy.  

## 2016-05-29 NOTE — Addendum Note (Signed)
Addended by: Therisa Doyne on: 05/29/2016 03:12 PM   Modules accepted: Orders

## 2016-05-29 NOTE — Progress Notes (Signed)
05/29/2016 Steven Boyd   1940-07-20  [id number]  Primary Physician Glo Herring., MD Primary Cardiologist: Lorretta Harp MD Steven Boyd  HPI:  Steven Boyd is a 76 year old mildly overweight married Caucasian male patient of Steven Boyd  and Dr. Percival Spanish who is referred for evaluation of peripheral arterial disease. He has a history of 60 pack years of tobacco abuse, hypertension and type 2 diabetes. His never had a heart attack or stroke. He had negative stress echo 3 years ago. He was being evaluated for preoperative clearance before cholecystectomy which ultimately was decided that he no longer needed. He was found to be in atrial fibrillation and was begun on Xarelto. He really denies claudication. He does have a diminished left pedal pulse.   Current Outpatient Prescriptions  Medication Sig Dispense Refill  . aspirin EC 81 MG tablet Take 81 mg by mouth daily.    Marland Kitchen atenolol (TENORMIN) 50 MG tablet Take 50 mg by mouth daily.    Marland Kitchen lisinopril (PRINIVIL,ZESTRIL) 20 MG tablet Take 40 mg by mouth 2 (two) times daily.    . Omega-3 Fatty Acids (FISH OIL) 1200 MG CAPS Take 1,200 mg by mouth daily.     . rivaroxaban (XARELTO) 20 MG TABS tablet Take 1 tablet (20 mg total) by mouth daily with supper. 30 tablet 5  . tamsulosin (FLOMAX) 0.4 MG CAPS capsule Take 0.4 mg by mouth.     No current facility-administered medications for this visit.     No Known Allergies  Social History   Social History  . Marital status: Married    Spouse name: N/A  . Number of children: N/A  . Years of education: N/A   Occupational History  . Not on file.   Social History Main Topics  . Smoking status: Current Every Day Smoker    Packs/day: 1.00    Years: 55.00    Types: Cigarettes  . Smokeless tobacco: Never Used  . Alcohol use No  . Drug use: No  . Sexual activity: Not on file   Other Topics Concern  . Not on file   Social History Narrative  . No narrative on file     Review of Systems: General: negative for chills, fever, night sweats or weight changes.  Cardiovascular: negative for chest pain, dyspnea on exertion, edema, orthopnea, palpitations, paroxysmal nocturnal dyspnea or shortness of breath Dermatological: negative for rash Respiratory: negative for cough or wheezing Urologic: negative for hematuria Abdominal: negative for nausea, vomiting, diarrhea, bright red blood per rectum, melena, or hematemesis Neurologic: negative for visual changes, syncope, or dizziness All other systems reviewed and are otherwise negative except as noted above.    Blood pressure 138/88, pulse 61, height 6\' 2"  (1.88 m), weight 217 lb (98.4 kg).  General appearance: alert and no distress Neck: no adenopathy, no carotid bruit, no JVD, supple, symmetrical, trachea midline and thyroid not enlarged, symmetric, no tenderness/mass/nodules Lungs: clear to auscultation bilaterally Heart: irregularly irregular rhythm Extremities: extremities normal, atraumatic, no cyanosis or edema  EKG atrial flutter with variable ventricular response of 61 and septal Q waves. Personally reviewed this EKG.  ASSESSMENT AND PLAN:   Peripheral arterial disease [facility]) Mr. Steven Boyd was referred to me for absent left pedal pulse. There is a question of a diagnosis of Buerger's disease. He has 60 pack years of tobacco abuse. He denies claudication. He does have a diminished left pedal pulses compared to the right. We will get lower extremity arterial Doppler  studies.      Lorretta Harp MD FACP,FACC,FAHA, [facility] 05/29/2016 2:47 PM

## 2016-05-30 NOTE — Addendum Note (Signed)
Addended by: Therisa Doyne on: 05/30/2016 12:30 PM   Modules accepted: Orders

## 2016-06-07 ENCOUNTER — Ambulatory Visit (HOSPITAL_COMMUNITY)
Admission: RE | Admit: 2016-06-07 | Discharge: 2016-06-07 | Disposition: A | Discharge status: Home / Self Care | Payer: Medicare Other | Source: Ambulatory Visit | Attending: Internal Medicine | Admitting: Internal Medicine

## 2016-06-07 ENCOUNTER — Encounter (HOSPITAL_COMMUNITY)
Admission: RE | Disposition: A | Discharge status: Home / Self Care | Payer: Self-pay | Source: Ambulatory Visit | Attending: Internal Medicine

## 2016-06-07 ENCOUNTER — Encounter (HOSPITAL_COMMUNITY): Payer: Self-pay

## 2016-06-07 DIAGNOSIS — F17210 Nicotine dependence, cigarettes, uncomplicated: Secondary | ICD-10-CM | POA: Diagnosis not present

## 2016-06-07 DIAGNOSIS — K259 Gastric ulcer, unspecified as acute or chronic, without hemorrhage or perforation: Secondary | ICD-10-CM | POA: Insufficient documentation

## 2016-06-07 DIAGNOSIS — K2950 Unspecified chronic gastritis without bleeding: Secondary | ICD-10-CM | POA: Diagnosis not present

## 2016-06-07 DIAGNOSIS — G8929 Other chronic pain: Secondary | ICD-10-CM | POA: Diagnosis not present

## 2016-06-07 DIAGNOSIS — E119 Type 2 diabetes mellitus without complications: Secondary | ICD-10-CM | POA: Diagnosis not present

## 2016-06-07 DIAGNOSIS — H9193 Unspecified hearing loss, bilateral: Secondary | ICD-10-CM | POA: Diagnosis not present

## 2016-06-07 DIAGNOSIS — Z7901 Long term (current) use of anticoagulants: Secondary | ICD-10-CM | POA: Insufficient documentation

## 2016-06-07 DIAGNOSIS — I10 Essential (primary) hypertension: Secondary | ICD-10-CM | POA: Diagnosis not present

## 2016-06-07 DIAGNOSIS — R1013 Epigastric pain: Secondary | ICD-10-CM | POA: Diagnosis not present

## 2016-06-07 DIAGNOSIS — Z86010 Personal history of colonic polyps: Secondary | ICD-10-CM | POA: Insufficient documentation

## 2016-06-07 DIAGNOSIS — Z79899 Other long term (current) drug therapy: Secondary | ICD-10-CM | POA: Diagnosis not present

## 2016-06-07 DIAGNOSIS — Z8546 Personal history of malignant neoplasm of prostate: Secondary | ICD-10-CM | POA: Insufficient documentation

## 2016-06-07 DIAGNOSIS — K449 Diaphragmatic hernia without obstruction or gangrene: Secondary | ICD-10-CM | POA: Insufficient documentation

## 2016-06-07 DIAGNOSIS — K228 Other specified diseases of esophagus: Secondary | ICD-10-CM | POA: Diagnosis not present

## 2016-06-07 DIAGNOSIS — K2970 Gastritis, unspecified, without bleeding: Secondary | ICD-10-CM | POA: Diagnosis not present

## 2016-06-07 DIAGNOSIS — B9681 Helicobacter pylori [H. pylori] as the cause of diseases classified elsewhere: Secondary | ICD-10-CM | POA: Diagnosis not present

## 2016-06-07 HISTORY — PX: ESOPHAGOGASTRODUODENOSCOPY: SHX5428

## 2016-06-07 SURGERY — EGD (ESOPHAGOGASTRODUODENOSCOPY)
Anesthesia: Moderate Sedation

## 2016-06-07 MED ORDER — MEPERIDINE HCL 50 MG/ML IJ SOLN
INTRAMUSCULAR | Status: DC | PRN
  Administered 2016-06-07 (×2): 25 mg via INTRAVENOUS

## 2016-06-07 MED ORDER — PANTOPRAZOLE SODIUM 40 MG PO TBEC: 40.0000 mg | DELAYED_RELEASE_TABLET | Freq: Two times a day (BID) | ORAL | 2 refills | Status: DC

## 2016-06-07 MED ORDER — SODIUM CHLORIDE 0.9 % IV SOLN
INTRAVENOUS | Status: DC
  Administered 2016-06-07: 10:00:00 via INTRAVENOUS

## 2016-06-07 MED ORDER — MIDAZOLAM HCL 5 MG/5ML IJ SOLN
INTRAMUSCULAR | Status: DC | PRN
  Administered 2016-06-07: 1 mg via INTRAVENOUS
  Administered 2016-06-07 (×2): 2 mg via INTRAVENOUS

## 2016-06-07 MED ORDER — MEPERIDINE HCL 50 MG/ML IJ SOLN
INTRAMUSCULAR | Status: AC
  Filled 2016-06-07: qty 1

## 2016-06-07 MED ORDER — MIDAZOLAM HCL 5 MG/5ML IJ SOLN
INTRAMUSCULAR | Status: AC
  Filled 2016-06-07: qty 10

## 2016-06-07 NOTE — Op Note (Signed)
[facility] Patient Name: Steven Boyd Procedure Date: 06/07/2016 9:56 AM MRN: [id number] Date of Birth: [birth year] Attending MD: Hildred Laser , MD CSN: [id number] Age: 76 Admit Type: Outpatient Procedure:                Upper GI endoscopy Indications:              Epigastric abdominal pain Providers:                Hildred Laser, MD, Otis Peak B. Sharon Seller, RN, Randa Spike, Technician, Aram Candela Referring MD:             Excell Seltzer MD, MD Medicines:                Cetacaine spray, Meperidine 50 mg IV, Midazolam 5                            mg IV Complications:            No immediate complications. Estimated Blood Loss:     Estimated blood loss was minimal. Procedure:                Pre-Anesthesia Assessment:                           - Prior to the procedure, a History and Physical                            was performed, and patient medications and                            allergies were reviewed. The patient's tolerance of                            previous anesthesia was also reviewed. The risks                            and benefits of the procedure and the sedation                            options and risks were discussed with the patient.                            All questions were answered, and informed consent                            was obtained. Prior Anticoagulants: The patient                            last took naproxen 7 days and Xarelto (rivaroxaban)                            2 days prior to the procedure. ASA Grade  Assessment: II - A patient with mild systemic                            disease. After reviewing the risks and benefits,                            the patient was deemed in satisfactory condition to                            undergo the procedure.                           After obtaining informed consent, the endoscope was                            passed under  direct vision. Throughout the                            procedure, the patient's blood pressure, pulse, and                            oxygen saturations were monitored continuously. The                            [id number]) scope was introduced through the                            mouth, and advanced to the second part of duodenum.                            The upper GI endoscopy was accomplished without                            difficulty. The patient tolerated the procedure                            well. Scope In: 10:35:51 AM Scope Out: 10:41:57 AM Total Procedure Duration: 0 hours 6 minutes 6 seconds  Findings:      The examined esophagus was normal.      The Z-line was irregular and was found 40 cm from the incisors.      A 2 cm hiatal hernia was present.      Patchy mild inflammation characterized by erosions and erythema was       found in the gastric antrum and in the prepyloric region of the stomach.      One non-bleeding cratered gastric ulcer with no stigmata of bleeding was       found at the incisura. The lesion was four mm by six mm in largest       dimension. Biopsies were taken with a cold forceps for histology.      The exam of the stomach was otherwise normal.      The duodenal bulb and second portion of the duodenum were normal. Impression:               - Normal esophagus.                           -  Z-line irregular, 40 cm from the incisors.                           - 2 cm hiatal hernia.                           - Gastritis.                           - Non-bleeding gastric ulcer with no stigmata of                            bleeding. Biopsied.                           - Normal duodenal bulb and second portion of the                            duodenum. Moderate Sedation:      Moderate (conscious) sedation was administered by the endoscopy nurse       and supervised by the endoscopist. The following parameters were       monitored: oxygen  saturation, heart rate, blood pressure, CO2       capnography and response to care. Total physician intraservice time was       9 minutes. Recommendation:           - Patient has a contact number available for                            emergencies. The signs and symptoms of potential                            delayed complications were discussed with the                            patient. Return to normal activities tomorrow.                            Written discharge instructions were provided to the                            patient.                           - Resume previous diet today.                           - Continue present medications.                           - Await pathology results.                           - Pantoprazole 40 mg by mouth twce a day.                           - No aspirin, ibuprofen, naproxen,  or other                            non-steroidal anti-inflammatory drugs.                           - Repeat upper endoscopy in 3 months to check                            healing. Procedure Code(s):        --- Professional ---                           [phone], Esophagogastroduodenoscopy, flexible,                            transoral; with biopsy, single or multiple Diagnosis Code(s):        --- Professional ---                           K22.8, Other specified diseases of esophagus                           K44.9, Diaphragmatic hernia without obstruction or                            gangrene                           K29.70, Gastritis, unspecified, without bleeding                           K25.9, Gastric ulcer, unspecified as acute or                            chronic, without hemorrhage or perforation                           R10.13, Epigastric pain CPT copyright 2016 American Medical Association. All rights reserved. The codes documented in this report are preliminary and upon coder review may  be revised to meet current compliance  requirements. Hildred Laser, MD Hildred Laser, MD 06/07/2016 10:55:23 AM This report has been signed electronically. Number of Addenda: 0

## 2016-06-07 NOTE — H&P (Addendum)
Steven Boyd is an 76 y.o. male.   Chief Complaint: Patient is here for EGD. HPI: Patient is 76 year old Caucasian male who presents with 6 month history of intermittent epigastric pain. He has more pain when he is hungry. He denies nausea vomiting melena or rectal bleeding. He is suspected to have gallbladder disease. As he has remote history of peptic ulcer disease and was recommended upper GI tract should be evaluated prior to cholecystectomy. He takes Aleve no more than once a week.  Past Medical History:  Diagnosis Date  . Diabetes mellitus without complication (HCC)    diet controlled  . Hard of hearing    bilateral, no hearing aids  . Hypertension   . Prostate cancer [facility])     Past Surgical History:  Procedure Laterality Date  . COLONOSCOPY N/A 05/08/2016   Procedure: COLONOSCOPY;  Surgeon: Rogene Houston, MD;  [facility];  Service: Endoscopy;  Laterality: N/A;  2:25-moved to 100 per Lelon Frohlich  . FEMUR IM NAIL Left 12/24/2014   Procedure: INTRAMEDULLARY (IM) NAIL FEMORAL;  Surgeon: Marchia Bond, MD;  Location: [place];  Service: Orthopedics;  Laterality: Left;  . POLYPECTOMY  05/08/2016   Procedure: POLYPECTOMY;  Surgeon: Rogene Houston, MD;  Location: [facility];  Service: Endoscopy;;  sigmoid colon polypectomies x3     Family History  Problem Relation Age of Onset  . Diabetes Mother   . Heart Problems Father     s/p heart valve repair 10 years before he passed away   Social History:  reports that he has been smoking Cigarettes.  He has a 55.00 pack-year smoking history. He has never used smokeless tobacco. He reports that he does not drink alcohol or use drugs.  Allergies: No Known Allergies  Medications Prior to Admission  Medication Sig Dispense Refill  . atenolol (TENORMIN) 50 MG tablet Take 50 mg by mouth daily.    Marland Kitchen lisinopril (PRINIVIL,ZESTRIL) 20 MG tablet Take 40 mg by mouth 2 (two) times daily.    . Omega-3 Fatty Acids (FISH OIL) 1200 MG CAPS Take  1,200 mg by mouth daily.     . rivaroxaban (XARELTO) 20 MG TABS tablet Take 1 tablet (20 mg total) by mouth daily with supper. (Patient taking differently: Take 20 mg by mouth daily with supper. Will stop 06-05-16) 30 tablet 5  . tamsulosin (FLOMAX) 0.4 MG CAPS capsule Take 0.4 mg by mouth daily.     . naproxen sodium (ANAPROX) 220 MG tablet Take 220 mg by mouth daily as needed.      No results found for this or any previous visit (from the past 48 hour(s)). No results found.  ROS  Blood pressure (!) 139/94, pulse 63, temperature 98.1 F (36.7 C), temperature source Oral, resp. rate 12, SpO2 97 %. Physical Exam  Constitutional: He appears well-developed and well-nourished.  HENT:  Mouth/Throat: Oropharynx is clear and moist.  Eyes: Conjunctivae are normal. No scleral icterus.  Neck: No thyromegaly present.  Cardiovascular: Normal rate and normal heart sounds.   No murmur heard. Iregular rhythm  Respiratory: Effort normal and breath sounds normal.  GI:  Abdomen is symmetrical soft with mild midepigastric tenderness. No organomegaly or masses.  Musculoskeletal: He exhibits no edema.  Lymphadenopathy:    He has no cervical adenopathy.  Neurological: He is alert.  Skin: Skin is warm and dry.     Assessment/Plan Chronic epigastric pain. Diagnostic EGD.  Hildred Laser, MD 06/07/2016, 10:26 AM

## 2016-06-07 NOTE — Discharge Instructions (Signed)
Do not take ibuprofen Aleve or similar medications. Can take Tylenol up to 2 g per day as needed for headache or musculoskeletal pain. Resume Xarelto on 06/08/2016. Resume other medications and diet as before. No driving for 24 hours. Physician will call with biopsy results.   Upper Endoscopy, Care After Refer to this sheet in the next few weeks. These instructions provide you with information about caring for yourself after your procedure. Your health care provider may also give you more specific instructions. Your treatment has been planned according to current medical practices, but problems sometimes occur. Call your health care provider if you have any problems or questions after your procedure. What can I expect after the procedure? After the procedure, it is common to have:  A sore throat.  Bloating.  Nausea. Follow these instructions at home:  Follow instructions from your health care provider about what to eat or drink after your procedure.  Return to your normal activities as told by your health care provider. Ask your health care provider what activities are safe for you.  Take over-the-counter and prescription medicines only as told by your health care provider.  Do not drive for 24 hours if you received a sedative.  Keep all follow-up visits as told by your health care provider. This is important. Contact a health care provider if:  You have a sore throat that lasts longer than one day.  You have trouble swallowing. Get help right away if:  You have a fever.  You vomit blood or your vomit looks like coffee grounds.  You have bloody, black, or tarry stools.  You have a severe sore throat or you cannot swallow.  You have difficulty breathing.  You have severe pain in your chest or belly. This information is not intended to replace advice given to you by your health care provider. Make sure you discuss any questions you have with your health care  provider. Document Released: 10/08/2011 Document Revised: 09/14/2015 Document Reviewed: 01/19/2015 Elsevier Interactive Patient Education  2017 [employer].  Hiatal Hernia A hiatal hernia occurs when part of your stomach slides above the muscle that separates your abdomen from your chest (diaphragm). You can be born with a hiatal hernia (congenital), or it may develop over time. In almost all cases of hiatal hernia, only the top part of the stomach pushes through.  Many people have a hiatal hernia with no symptoms. The larger the hernia, the more likely that you will have symptoms. In some cases, a hiatal hernia allows stomach acid to flow back into the tube that carries food from your mouth to your stomach (esophagus). This may cause heartburn symptoms. Severe heartburn symptoms may mean you have developed a condition called gastroesophageal reflux disease (GERD).  CAUSES  Hiatal hernias are caused by a weakness in the opening (hiatus) where your esophagus passes through your diaphragm to attach to the upper part of your stomach. You may be born with a weakness in your hiatus, or a weakness can develop. RISK FACTORS Older age is a major risk factor for a hiatal hernia. Anything that increases pressure on your diaphragm can also increase your risk of a hiatal hernia. This includes:  Pregnancy.  Excess weight.  Frequent constipation. SIGNS AND SYMPTOMS  People with a hiatal hernia often have no symptoms. If symptoms develop, they are almost always caused by GERD. They may include:  Heartburn.  Belching.  Indigestion.  Trouble swallowing.  Coughing or wheezing.  Sore throat.  Hoarseness.  Chest pain. DIAGNOSIS  A hiatal hernia is sometimes found during an exam for another problem. Your health care provider may suspect a hiatal hernia if you have symptoms of GERD. Tests may be done to diagnose GERD. These may include:  X-rays of your stomach or chest.  An upper  gastrointestinal (GI) series. This is an X-ray exam of your GI tract involving the use of a chalky liquid that you swallow. The liquid shows up clearly on the X-ray.  Endoscopy. This is a procedure to look into your stomach using a thin, flexible tube that has a tiny camera and light on the end of it. TREATMENT  If you have no symptoms, you may not need treatment. If you have symptoms, treatment may include:  Dietary and lifestyle changes to help reduce GERD symptoms.  Medicines. These may include:  Over-the-counter antacids.  Medicines that make your stomach empty more quickly.  Medicines that block the production of stomach acid (H2 blockers).  Stronger medicines to reduce stomach acid (proton pump inhibitors).  You may need surgery to repair the hernia if other treatments are not helping. HOME CARE INSTRUCTIONS   Take all medicines as directed by your health care provider.  Quit smoking, if you smoke.  Try to achieve and maintain a healthy body weight.  Eat frequent small meals instead of three large meals a day. This keeps your stomach from getting too full.  Eat slowly.  Do not lie down right after eating.  Do noteat 1-2 hours before bed.   Do not drink beverages with caffeine. These include cola, coffee, cocoa, and tea.  Do not drink alcohol.  Avoid foods that can make symptoms of GERD worse. These may include:  Fatty foods.  Citrus fruits.  Other foods and drinks that contain acid.  Avoid putting pressure on your belly. Anything that puts pressure on your belly increases the amount of acid that may be pushed up into your esophagus.   Avoid bending over, especially after eating.  Raise the head of your bed by putting blocks under the legs. This keeps your head and esophagus higher than your stomach.  Do not wear tight clothing around your chest or stomach.  Try not to strain when having a bowel movement, when urinating, or when lifting heavy  objects. SEEK MEDICAL CARE IF:  Your symptoms are not controlled with medicines or lifestyle changes.  You are having trouble swallowing.  You have coughing or wheezing that will not go away. SEEK IMMEDIATE MEDICAL CARE IF:  Your pain is getting worse.  Your pain spreads to your arms, neck, jaw, teeth, or back.  You have shortness of breath.  You sweat for no reason.  You feel sick to your stomach (nauseous) or vomit.  You vomit blood.  You have bright red blood in your stools.  You have black, tarry stools.  This information is not intended to replace advice given to you by your health care provider. Make sure you discuss any questions you have with your health care provider. Document Released: 06/29/2003 Document Revised: 07/31/2015 Document Reviewed: 03/26/2013 Elsevier Interactive Patient Education  2017 [employer].

## 2016-06-11 ENCOUNTER — Telehealth (INDEPENDENT_AMBULATORY_CARE_PROVIDER_SITE_OTHER): Payer: Self-pay | Admitting: Internal Medicine

## 2016-06-11 NOTE — Telephone Encounter (Signed)
Patient's spouse Vernell called and would like to speak to Dr. Laural Golden.  [phone]

## 2016-06-12 ENCOUNTER — Encounter (HOSPITAL_COMMUNITY): Payer: Self-pay | Admitting: Internal Medicine

## 2016-06-14 ENCOUNTER — Encounter (INDEPENDENT_AMBULATORY_CARE_PROVIDER_SITE_OTHER): Payer: Self-pay | Admitting: Internal Medicine

## 2016-06-14 ENCOUNTER — Other Ambulatory Visit (INDEPENDENT_AMBULATORY_CARE_PROVIDER_SITE_OTHER): Payer: Self-pay | Admitting: Internal Medicine

## 2016-06-14 MED ORDER — BIS SUBCIT-METRONID-TETRACYC 140-125-125 MG PO CAPS: 3.0000 | ORAL_CAPSULE | Freq: Three times a day (TID) | ORAL | 0 refills | Status: DC

## 2016-06-14 NOTE — Progress Notes (Signed)
Patient was given an appointment for 08/13/16 at 2:45pm with Dr. Laural Golden.

## 2016-06-17 NOTE — Telephone Encounter (Signed)
Addressed with Dr.Rehman. Before we can call in Prilosec 20 mg twice a day. Clarithromycin 500 mg for 10 days Amoxicillin 1 gram po BID for 10 days..  We need to check and see if the patient is allergic PCN , before we can call this in.

## 2016-06-19 NOTE — Telephone Encounter (Signed)
Rx was called to [facility] in [place] on the [address]. Patient's wife was made aware.

## 2016-06-20 ENCOUNTER — Encounter (HOSPITAL_COMMUNITY): Payer: Medicare Other

## 2016-06-21 ENCOUNTER — Other Ambulatory Visit: Payer: Self-pay | Admitting: *Deleted

## 2016-06-21 ENCOUNTER — Ambulatory Visit (HOSPITAL_COMMUNITY)
Admission: RE | Admit: 2016-06-21 | Discharge: 2016-06-21 | Disposition: A | Discharge status: Home / Self Care | Payer: Medicare Other | Source: Ambulatory Visit | Attending: Cardiovascular Disease | Admitting: Cardiovascular Disease

## 2016-06-21 ENCOUNTER — Other Ambulatory Visit: Payer: Self-pay | Admitting: Cardiovascular Disease

## 2016-06-21 DIAGNOSIS — R0989 Other specified symptoms and signs involving the circulatory and respiratory systems: Secondary | ICD-10-CM | POA: Insufficient documentation

## 2016-06-21 MED ORDER — RIVAROXABAN 20 MG PO TABS: 20.0000 mg | ORAL_TABLET | Freq: Every day | ORAL | 0 refills | Status: DC

## 2016-06-24 ENCOUNTER — Encounter (HOSPITAL_COMMUNITY): Payer: Self-pay | Admitting: Emergency Medicine

## 2016-06-24 ENCOUNTER — Emergency Department (HOSPITAL_COMMUNITY)
Admission: EM | Admit: 2016-06-24 | Discharge: 2016-06-25 | Disposition: A | Discharge status: Home / Self Care | Payer: Medicare Other | Attending: Emergency Medicine | Admitting: Emergency Medicine

## 2016-06-24 ENCOUNTER — Emergency Department (HOSPITAL_COMMUNITY): Payer: Medicare Other

## 2016-06-24 DIAGNOSIS — Z79899 Other long term (current) drug therapy: Secondary | ICD-10-CM | POA: Diagnosis not present

## 2016-06-24 DIAGNOSIS — F17210 Nicotine dependence, cigarettes, uncomplicated: Secondary | ICD-10-CM | POA: Diagnosis not present

## 2016-06-24 DIAGNOSIS — E119 Type 2 diabetes mellitus without complications: Secondary | ICD-10-CM | POA: Diagnosis not present

## 2016-06-24 DIAGNOSIS — R0981 Nasal congestion: Secondary | ICD-10-CM | POA: Insufficient documentation

## 2016-06-24 DIAGNOSIS — Z8546 Personal history of malignant neoplasm of prostate: Secondary | ICD-10-CM | POA: Diagnosis not present

## 2016-06-24 DIAGNOSIS — R42 Dizziness and giddiness: Secondary | ICD-10-CM | POA: Diagnosis not present

## 2016-06-24 DIAGNOSIS — R110 Nausea: Secondary | ICD-10-CM | POA: Diagnosis not present

## 2016-06-24 DIAGNOSIS — I10 Essential (primary) hypertension: Secondary | ICD-10-CM | POA: Insufficient documentation

## 2016-06-24 DIAGNOSIS — R079 Chest pain, unspecified: Secondary | ICD-10-CM | POA: Diagnosis not present

## 2016-06-24 LAB — COMPREHENSIVE METABOLIC PANEL
ALT: 24 U/L (ref 17–63)
ANION GAP: 6 (ref 5–15)
AST: 22 U/L (ref 15–41)
Albumin: 4.2 g/dL (ref 3.5–5.0)
Alkaline Phosphatase: 99 U/L (ref 38–126)
BUN: 14 mg/dL (ref 6–20)
CHLORIDE: 105 mmol/L (ref 101–111)
CO2: 28 mmol/L (ref 22–32)
Calcium: 9 mg/dL (ref 8.9–10.3)
Creatinine, Ser: 1.04 mg/dL (ref 0.61–1.24)
GFR calc non Af Amer: >60 mL/min (ref >60)
Glucose, Bld: 121 mg/dL — ABNORMAL HIGH (ref 65–99)
POTASSIUM: 4.1 mmol/L (ref 3.5–5.1)
SODIUM: 139 mmol/L (ref 135–145)
Total Bilirubin: 0.7 mg/dL (ref 0.3–1.2)
Total Protein: 7.4 g/dL (ref 6.5–8.1)

## 2016-06-24 LAB — CBC
HCT: 47 % (ref 39.0–52.0)
Hemoglobin: 16.1 g/dL (ref 13.0–17.0)
MCH: 31.4 pg (ref 26.0–34.0)
MCHC: 34.3 g/dL (ref 30.0–36.0)
MCV: 91.8 fL (ref 78.0–100.0)
PLATELETS: 166 10*3/uL (ref 150–400)
RBC: 5.12 MIL/uL (ref 4.22–5.81)
RDW: 13.3 % (ref 11.5–15.5)
WBC: 7.5 10*3/uL (ref 4.0–10.5)

## 2016-06-24 LAB — TROPONIN I: Troponin I: <0.03 ng/mL (ref <0.03)

## 2016-06-24 NOTE — ED Provider Notes (Signed)
EKG Interpretation  Date/Time:  Monday June 24 2016 19:03:25 EST Ventricular Rate:  56 PR Interval:    QRS Duration: 96 QT Interval:  428 QTC Calculation: 413 R Axis:   52 Text Interpretation:  Atrial fibrillation with slow ventricular response No significant change since last tracing 08 May 2016 Confirmed by [facility]  MD-I, Amaryllis Malmquist ([id number]) on 06/24/2016 11:52:07 PM       EKG Interpretation  Date/Time:  Monday June 24 2016 23:41:27 EST Ventricular Rate:  51 PR Interval:    QRS Duration: 112 QT Interval:  489 QTC Calculation: 451 R Axis:   60 Text Interpretation:  Atrial fibrillation Incomplete left bundle branch block Anterior Q waves, possibly due to ILBBB Baseline wander in lead(s) V1 No significant change since last tracing EARLIER SAME DATE Confirmed by [place]  MD-I, Mena Lienau ([id number]) on 06/24/2016 11:53:18 PM       Rolland Porter, MD, Barbette Or, MD 06/25/16 2257

## 2016-06-24 NOTE — ED Triage Notes (Signed)
Pt reports dizziness for 2 days with nausea.  Daughter states pt has been c/o chest pain, but pt currently denies.

## 2016-06-25 ENCOUNTER — Emergency Department (HOSPITAL_COMMUNITY): Payer: Medicare Other

## 2016-06-25 DIAGNOSIS — R42 Dizziness and giddiness: Secondary | ICD-10-CM | POA: Diagnosis not present

## 2016-06-25 MED ORDER — ONDANSETRON 4 MG PO TBDP: 4.0000 mg | ORAL_TABLET | Freq: Three times a day (TID) | ORAL | 0 refills | Status: DC | PRN

## 2016-06-25 MED ORDER — LISINOPRIL 10 MG PO TABS
40.0000 mg | ORAL_TABLET | Freq: Once | ORAL | Status: AC
  Administered 2016-06-25: 40 mg via ORAL
  Filled 2016-06-25: qty 4

## 2016-06-25 MED ORDER — MECLIZINE HCL 12.5 MG PO TABS
25.0000 mg | ORAL_TABLET | Freq: Once | ORAL | Status: AC
  Administered 2016-06-25: 25 mg via ORAL
  Filled 2016-06-25: qty 2

## 2016-06-25 MED ORDER — MECLIZINE HCL 25 MG PO TABS: 25.0000 mg | ORAL_TABLET | Freq: Three times a day (TID) | ORAL | 0 refills | Status: DC | PRN

## 2016-06-25 MED ORDER — FAMOTIDINE 20 MG PO TABS
20.0000 mg | ORAL_TABLET | Freq: Once | ORAL | Status: AC
  Administered 2016-06-25: 20 mg via ORAL
  Filled 2016-06-25: qty 1

## 2016-06-25 MED ORDER — ONDANSETRON 4 MG PO TBDP
4.0000 mg | ORAL_TABLET | Freq: Once | ORAL | Status: AC
  Administered 2016-06-25: 4 mg via ORAL
  Filled 2016-06-25: qty 1

## 2016-06-25 NOTE — ED Notes (Signed)
Patient transported to CT 

## 2016-06-25 NOTE — ED Notes (Signed)
ED Provider at bedside. 

## 2016-06-25 NOTE — ED Provider Notes (Signed)
[facility] DEPT Provider Note   CSN: [id number] Arrival date & time: 06/24/16  [id number]     History   Chief Complaint Chief Complaint  Patient presents with  . Dizziness    HPI Steven Boyd is a 76 y.o. male.  The history is provided by the patient, the spouse and a relative.  Dizziness  Quality:  Room spinning Severity:  Moderate Onset quality:  Gradual Duration:  2 days Timing:  Constant Progression:  Worsening Chronicity:  New Context: head movement and standing up   Relieved by:  None tried Worsened by:  Movement and turning head Ineffective treatments:  None tried Associated symptoms: nausea and tinnitus   Associated symptoms: no diarrhea, no headaches, no palpitations, no shortness of breath, no syncope, no vision changes, no vomiting and no weakness   Associated symptoms comment:  Pt has chronic hearing loss, he denies recent uri sx, fever or flu like symptoms.  He reports increased nasal discharge with darker right nasal discharge, possibly blood tinged when he first wakes.  He is having acid reflux but no vomiting.  Was recently diagnosed with h pylori and awaiting antibiotics to start treatment.  He denies focal weakness and headache.  He has recently been diagnosed with atrial fibrillation which is treated with Jennye Moccasin. Risk factors: no anemia, no hx of stroke, no hx of vertigo and no Meniere's disease    Pt with DM, HTN and recently diagnosed with rate controlled a fib (chronically on atenolol) and started on Xaralto with a 2 day history of vertigo in association with nausea.  Denies headache, tinnitus or ear pain.    Past Medical History:  Diagnosis Date  . Diabetes mellitus without complication (HCC)    diet controlled  . Hard of hearing    bilateral, no hearing aids  . Hypertension   . Prostate cancer [facility])     Patient Active Problem List   Diagnosis Date Noted  . Abdominal pain, epigastric 05/29/2016  . Peripheral arterial disease ([place])  05/29/2016  . Encounter for screening colonoscopy 03/18/2016  . Intertrochanteric fracture of left hip ([place]) 12/24/2014  . HTN (hypertension) 12/24/2014    Past Surgical History:  Procedure Laterality Date  . COLONOSCOPY N/A 05/08/2016   Procedure: COLONOSCOPY;  Surgeon: Rogene Houston, MD;  [facility];  Service: Endoscopy;  Laterality: N/A;  2:25-moved to 100 per Ann  . ESOPHAGOGASTRODUODENOSCOPY N/A 06/07/2016   Procedure: ESOPHAGOGASTRODUODENOSCOPY (EGD);  Surgeon: Rogene Houston, MD;  Location[facility];  Service: Endoscopy;  Laterality: N/A;  915  . FEMUR IM NAIL Left 12/24/2014   Procedure: INTRAMEDULLARY (IM) NAIL FEMORAL;  Surgeon: Marchia Bond, MD;  Location: [place];  Service: Orthopedics;  Laterality: Left;  . POLYPECTOMY  05/08/2016   Procedure: POLYPECTOMY;  Surgeon: Rogene Houston, MD;  [facility];  Service: Endoscopy;;  sigmoid colon polypectomies x3        Home Medications    Prior to Admission medications   Medication Sig Start Date End Date Taking? Authorizing Provider  atenolol (TENORMIN) 50 MG tablet Take 50 mg by mouth daily.   Yes Historical Provider, MD  bismuth-metronidazole-tetracycline [facility]) [phone] MG capsule Take 3 capsules by mouth 4 (four) times daily -  before meals and at bedtime. 06/14/16   Rogene Houston, MD  lisinopril (PRINIVIL,ZESTRIL) 20 MG tablet Take 40 mg by mouth 2 (two) times daily.    Historical Provider, MD  meclizine (ANTIVERT) 25 MG tablet Take 1 tablet (25  mg total) by mouth 3 (three) times daily as needed for dizziness. 06/25/16   Evalee Jefferson, PA-C  Omega-3 Fatty Acids (FISH OIL) 1200 MG CAPS Take 1,200 mg by mouth daily.     Historical Provider, MD  ondansetron (ZOFRAN ODT) 4 MG disintegrating tablet Take 1 tablet (4 mg total) by mouth every 8 (eight) hours as needed for nausea or vomiting. 06/25/16   Evalee Jefferson, PA-C  pantoprazole (PROTONIX) 40 MG tablet Take 1 tablet (40 mg total) by mouth 2 (two) times  daily before a meal. 06/07/16   Rogene Houston, MD  rivaroxaban (XARELTO) 20 MG TABS tablet Take 1 tablet (20 mg total) by mouth daily with supper. 06/21/16   Lorretta Harp, MD  tamsulosin (FLOMAX) 0.4 MG CAPS capsule Take 0.4 mg by mouth daily.     Historical Provider, MD    Family History Family History  Problem Relation Age of Onset  . Diabetes Mother   . Heart Problems Father     s/p heart valve repair 10 years before he passed away    Social History Social History  Substance Use Topics  . Smoking status: Current Every Day Smoker    Packs/day: 1.00    Years: 55.00    Types: Cigarettes  . Smokeless tobacco: Never Used  . Alcohol use No     Allergies   Dilaudid [hydromorphone hcl]   Review of Systems Review of Systems  Constitutional: Negative.   HENT: Positive for congestion and tinnitus. Negative for trouble swallowing.        Negative except as mentioned in HPI.   Eyes: Negative for visual disturbance.  Respiratory: Negative for shortness of breath.   Cardiovascular: Negative for palpitations and syncope.  Gastrointestinal: Positive for nausea. Negative for diarrhea and vomiting.  Neurological: Positive for dizziness. Negative for speech difficulty, weakness, numbness and headaches.     Physical Exam Updated Vital Signs BP (!) 171/102 (BP Location: Right Arm)   Pulse (!) 56   Temp 98 F (36.7 C) (Oral)   Resp 14   Ht 6\' 2"  (1.88 m)   Wt 99.8 kg   SpO2 95%   BMI 28.25 kg/m   Physical Exam  Constitutional: He appears well-developed and well-nourished.  HENT:  Head: Normocephalic and atraumatic.  Right Ear: External ear normal.  Left Ear: External ear normal.  Mouth/Throat: Oropharynx is clear and moist.  Eyes: Conjunctivae and EOM are normal. Pupils are equal, round, and reactive to light.  No nystagmus.  Vertiginous sx and nausea triggered by head movement, but not eye movement.  Neck: Normal range of motion. Neck supple.  Cardiovascular: Normal  rate, normal heart sounds and intact distal pulses.  An irregularly irregular rhythm present.  Pulmonary/Chest: Effort normal and breath sounds normal. He has no decreased breath sounds.  Abdominal: Soft. Bowel sounds are normal. There is no tenderness.  Musculoskeletal: Normal range of motion.  Bilateral ankle edema.   Neurological: He is alert. He has normal strength. No cranial nerve deficit or sensory deficit.  Reflex Scores:      Bicep reflexes are 2+ on the right side and 2+ on the left side. Equal grip strength, moves all 4 extremities without weakness, no foot drop.  Negative pronator drift. Cranial nerves III-XII intact. Normal rapid alternating movements.  Skin: Skin is warm and dry.  Psychiatric: He has a normal mood and affect.  Nursing note and vitals reviewed.    ED Treatments / Results  Labs (all labs ordered are  listed, but only abnormal results are displayed) Labs Reviewed  COMPREHENSIVE METABOLIC PANEL - Abnormal; Notable for the following:       Result Value   Glucose, Bld 121 (*)    All other components within normal limits  CBC  TROPONIN I    EKG  EKG Interpretation  Date/Time:  Monday June 24 2016 23:41:27 EST Ventricular Rate:  51 PR Interval:    QRS Duration: 112 QT Interval:  489 QTC Calculation: 451 R Axis:   60 Text Interpretation:  Atrial fibrillation Incomplete left bundle branch block Anterior Q waves, possibly due to ILBBB Baseline wander in lead(s) V1 No significant change since last tracing EARLIER SAME DATE Confirmed by [place]  MD-I, IVA ([id number]) on 06/24/2016 11:53:18 PM       Radiology Dg Chest 2 View  Result Date: 06/24/2016 CLINICAL DATA:  Dizziness, chest pain EXAM: CHEST  2 VIEW COMPARISON:  02/08/2013 FINDINGS: Calcified right lower lobe pulmonary nodule likely reflecting sequela prior granulomatous disease. Bilateral mild chronic interstitial thickening. There is no focal parenchymal opacity. There is no pleural effusion or  pneumothorax. The heart and mediastinal contours are unremarkable. The osseous structures are unremarkable. IMPRESSION: No active cardiopulmonary disease. Electronically Signed   By: Kathreen Devoid   On: 06/24/2016 19:22   Ct Head Wo Contrast  Result Date: 06/25/2016 CLINICAL DATA:  Dizziness x2 days with nausea EXAM: CT HEAD WITHOUT CONTRAST TECHNIQUE: Contiguous axial images were obtained from the base of the skull through the vertex without intravenous contrast. COMPARISON:  None. FINDINGS: Brain: Mild chronic stable superficial and central atrophy with minimal small vessel ischemic disease of periventricular white matter. No intra-axial mass nor extra-axial fluid collections. No large vascular territory infarction. Mega cisterna magna of incidental note. Vascular: No hyperdense vessel or unexpected calcification. Skull: Normal. Negative for fracture or focal lesion. Mild joint space narrowing of the atlantodental interval with subcortical cystic changes of the odontoid process. Sinuses/Orbits: Mucous retention cyst of the left maxillary sinus. Clear bilateral mastoids. The frontal and sphenoid sinuses are clear. Minimal ethmoid sinus mucosal thickening. Mild levoconvex curvature of the nasal septum with left-sided nasal septal spur. Symmetric appearing globes and orbits. Other: None. IMPRESSION: Chronic small vessel ischemic disease of periventricular white matter. Chronic mild superficial and central atrophy. Probable mucous retention cyst of the left maxillary sinus. Mild deviation of nasal septum convex to the left with prominent left nasal septal spur. Electronically Signed   By: Ashley Royalty M.D.   On: 06/25/2016 00:49    Procedures Procedures (including critical care time)  Medications Ordered in ED Medications  lisinopril (PRINIVIL,ZESTRIL) tablet 40 mg (40 mg Oral Given 06/25/16 0047)  meclizine (ANTIVERT) tablet 25 mg (25 mg Oral Given 06/25/16 0047)  ondansetron (ZOFRAN-ODT) disintegrating tablet  4 mg (4 mg Oral Given 06/25/16 0047)  famotidine (PEPCID) tablet 20 mg (20 mg Oral Given 06/25/16 0047)  meclizine (ANTIVERT) tablet 25 mg (25 mg Oral Given 06/25/16 0202)     Initial Impression / Assessment and Plan / ED Course  I have reviewed the triage vital signs and the nursing notes.  Pertinent labs & imaging results that were available during my care of the patient were reviewed by me and considered in my medical decision making (see chart for details).     Pt reports increased acid reflux with water brash sx since arrival here, pepcid given.  He was also given his evening dose of lisinopril which he missed while waiting here.  Meclizine and zofran for  dizziness and nausea.   Pt sx improving with meclizine given. He was able to ambulate without difficulty prior to dc home.  Prescribed additional meclizine, given info about epley maneuvers. Advised recheck by pcp within 1 week if sx persist.   CT negative for hemorrhagic cva. No focal neuro deficits, sx suggesting peripheral vertigo.  Final Clinical Impressions(s) / ED Diagnoses   Final diagnoses:  Vertigo    New Prescriptions Discharge Medication List as of 06/25/2016  1:50 AM    START taking these medications   Details  meclizine (ANTIVERT) 25 MG tablet Take 1 tablet (25 mg total) by mouth 3 (three) times daily as needed for dizziness., Starting Tue 06/25/2016, Print    ondansetron (ZOFRAN ODT) 4 MG disintegrating tablet Take 1 tablet (4 mg total) by mouth every 8 (eight) hours as needed for nausea or vomiting., Starting Tue 06/25/2016, Print         Evalee Jefferson, PA-C 06/25/16 [place], MD 06/25/16 2256

## 2016-06-26 ENCOUNTER — Telehealth: Payer: Self-pay | Admitting: Cardiovascular Disease

## 2016-06-26 ENCOUNTER — Telehealth (INDEPENDENT_AMBULATORY_CARE_PROVIDER_SITE_OTHER): Payer: Self-pay | Admitting: *Deleted

## 2016-06-26 NOTE — Telephone Encounter (Signed)
Spoke to pt to inform of ABI results--see previous telephone note. Pt verbalized understanding, wanted to say hello to Dr. Gwenlyn Found and expressed appreciation for the results.  Pt also stated he was approved for Patient Assistance for Xarelto through [employer] year free. Pt stated he will need written/printed Rx every month to prove that he is taking it. Pt would like to pick up Rx's at Dr. Nevin Bloodgood' office because it is closer. Told pt that if Dr. Gerarda Fraction is not able to do that, we will take care of that for him. Pt verbalized thanks and understanding. He will call to let us know if he needs Dr. Gwenlyn Found to provide the Rx.

## 2016-06-26 NOTE — Telephone Encounter (Signed)
A message was left asking that the patient plead check with the prescribing doctor for Tamsulosin. To see if he could stop that for 14 days while being treated for H-Pylori or take the Tamsulosin evert other day. There is a possible drug interaction between the Tamsuloson and Biaxin.  Awaiting a call back from the patient.

## 2016-06-26 NOTE — Telephone Encounter (Signed)
New Message     Returning [place] call from earlier in day

## 2016-06-26 NOTE — Telephone Encounter (Signed)
[facility] ([facility])  Order: [id number]  Status:  Final result Visible to patient:  No (Not Released) Dx:  Absent pulse  Notes Recorded by Therisa Doyne on 06/26/2016 at 3:09 PM EST lmtcb for results ------  Notes Recorded by Lorretta Harp, MD on 06/24/2016 at 10:56 AM EST Normal ABIs and TBIs.

## 2016-07-17 DIAGNOSIS — C61 Malignant neoplasm of prostate: Secondary | ICD-10-CM | POA: Diagnosis not present

## 2016-07-18 DIAGNOSIS — M1991 Primary osteoarthritis, unspecified site: Secondary | ICD-10-CM | POA: Diagnosis not present

## 2016-07-18 DIAGNOSIS — Z1389 Encounter for screening for other disorder: Secondary | ICD-10-CM | POA: Diagnosis not present

## 2016-07-18 DIAGNOSIS — Z6828 Body mass index (BMI) 28.0-28.9, adult: Secondary | ICD-10-CM | POA: Diagnosis not present

## 2016-07-18 DIAGNOSIS — H8113 Benign paroxysmal vertigo, bilateral: Secondary | ICD-10-CM | POA: Diagnosis not present

## 2016-07-18 DIAGNOSIS — C61 Malignant neoplasm of prostate: Secondary | ICD-10-CM | POA: Diagnosis not present

## 2016-07-18 DIAGNOSIS — I10 Essential (primary) hypertension: Secondary | ICD-10-CM | POA: Diagnosis not present

## 2016-07-18 DIAGNOSIS — E1140 Type 2 diabetes mellitus with diabetic neuropathy, unspecified: Secondary | ICD-10-CM | POA: Diagnosis not present

## 2016-07-23 ENCOUNTER — Ambulatory Visit (INDEPENDENT_AMBULATORY_CARE_PROVIDER_SITE_OTHER): Payer: Medicare Other | Admitting: Urology

## 2016-07-23 DIAGNOSIS — C61 Malignant neoplasm of prostate: Secondary | ICD-10-CM

## 2016-07-23 DIAGNOSIS — N5201 Erectile dysfunction due to arterial insufficiency: Secondary | ICD-10-CM

## 2016-08-13 ENCOUNTER — Encounter (INDEPENDENT_AMBULATORY_CARE_PROVIDER_SITE_OTHER): Payer: Self-pay | Admitting: Internal Medicine

## 2016-08-13 ENCOUNTER — Ambulatory Visit (INDEPENDENT_AMBULATORY_CARE_PROVIDER_SITE_OTHER): Payer: Medicare Other | Admitting: Internal Medicine

## 2016-08-13 VITALS — BP 140/90 | HR 64 | Temp 97.7°F | Resp 18 | Ht 74.0 in | Wt 225.7 lb

## 2016-08-13 DIAGNOSIS — A048 Other specified bacterial intestinal infections: Secondary | ICD-10-CM

## 2016-08-13 DIAGNOSIS — K253 Acute gastric ulcer without hemorrhage or perforation: Secondary | ICD-10-CM

## 2016-08-13 MED ORDER — OMEPRAZOLE 20 MG PO CPDR: 20.0000 mg | DELAYED_RELEASE_CAPSULE | Freq: Every day | ORAL | 5 refills | Status: DC

## 2016-08-13 NOTE — Patient Instructions (Signed)
Esophagogastroduodenoscopy in 3-4 weeks.

## 2016-08-13 NOTE — Progress Notes (Signed)
Presenting complaint;  Follow-up for peptic ulcer disease.  Database and Subjective:  Patient is 76 year old Caucasian male who was evaluated for epigastric pain. He underwent EGD on 06/07/2016. He was found to have gastric ulcer. Biopsy revealed benign etiology but H. pylori stains are positive. Patient was given prescription for pantoprazole which she stopped after 2 or 3 doses because of nausea and vomiting. Prescription for Pylera was called along with omeprazole and he finished it without any difficulty.   He now returns for follow visit accompanied by his daughter [name]. He took omeprazole for a few days after he finished Pylera. He is not taking any medication for acid suppression. He has good appetite. He denies abdominal pain nausea vomiting melena or rectal bleeding. He has gained 8 pounds since his last visit of about 5 months ago.    Current Medications: Outpatient Encounter Prescriptions as of 08/13/2016  Medication Sig  . atenolol (TENORMIN) 50 MG tablet Take 50 mg by mouth daily.  Marland Kitchen lisinopril (PRINIVIL,ZESTRIL) 20 MG tablet Take 40 mg by mouth 2 (two) times daily.  . Omega-3 Fatty Acids (FISH OIL) 1200 MG CAPS Take 1,200 mg by mouth daily.   . rivaroxaban (XARELTO) 20 MG TABS tablet Take 1 tablet (20 mg total) by mouth daily with supper.  . tamsulosin (FLOMAX) 0.4 MG CAPS capsule Take 0.4 mg by mouth daily.   . [DISCONTINUED] bismuth-metronidazole-tetracycline (PYLERA) 140-125-125 MG capsule Take 3 capsules by mouth 4 (four) times daily -  before meals and at bedtime. (Patient not taking: Reported on 08/13/2016)  . [DISCONTINUED] meclizine (ANTIVERT) 25 MG tablet Take 1 tablet (25 mg total) by mouth 3 (three) times daily as needed for dizziness. (Patient not taking: Reported on 08/13/2016)  . [DISCONTINUED] ondansetron (ZOFRAN ODT) 4 MG disintegrating tablet Take 1 tablet (4 mg total) by mouth every 8 (eight) hours as needed for nausea or vomiting. (Patient not taking: Reported  on 08/13/2016)  . [DISCONTINUED] pantoprazole (PROTONIX) 40 MG tablet Take 1 tablet (40 mg total) by mouth 2 (two) times daily before a meal. (Patient not taking: Reported on 08/13/2016)   No facility-administered encounter medications on file as of 08/13/2016.      Objective: Blood pressure 140/90, pulse 64, temperature 97.7 F (36.5 C), temperature source Oral, resp. rate 18, height 6\' 2"  (1.88 m), weight 225 lb 11.2 oz (102.4 kg). Patient is alert and in no acute distress. Conjunctiva is pink. Sclera is nonicteric Oropharyngeal mucosa is normal. No neck masses or thyromegaly noted. Cardiac exam with regular rhythm normal S1 and S2. No murmur or gallop noted. Lungs are clear to auscultation. Abdomen is full. On palpation is soft and nontender without organomegaly or masses. No LE edema or clubbing noted. He has Dupuytren's contracture involving right hand.   Assessment:  #1. Gastric ulcer. He was found to have H. pylori gastritis and he was able to finish Pylera. He is presently asymptomatic. He needs to be on a PPI until complete healing of this ulcer documented. He also had ulcer over 40 years ago and therefore he may consider chronic PPI for prophylaxis. #2. H. pylori infection. He has completed tenderness of Pylera. He will undergo follow-up testing either with biopsy or stool antigen.   Plan:  Omeprazole 20 mg by mouth every morning. Esophagogastroduodenoscopy in 3-4 weeks. Patient will need to be off anticoagulants for 2 days prior to procedure. Office visit in one year.

## 2016-08-15 ENCOUNTER — Other Ambulatory Visit (INDEPENDENT_AMBULATORY_CARE_PROVIDER_SITE_OTHER): Payer: Self-pay | Admitting: *Deleted

## 2016-08-15 ENCOUNTER — Encounter (INDEPENDENT_AMBULATORY_CARE_PROVIDER_SITE_OTHER): Payer: Self-pay | Admitting: *Deleted

## 2016-08-15 DIAGNOSIS — Z8619 Personal history of other infectious and parasitic diseases: Secondary | ICD-10-CM | POA: Insufficient documentation

## 2016-08-15 DIAGNOSIS — K257 Chronic gastric ulcer without hemorrhage or perforation: Secondary | ICD-10-CM

## 2016-08-19 DIAGNOSIS — M1991 Primary osteoarthritis, unspecified site: Secondary | ICD-10-CM | POA: Diagnosis not present

## 2016-08-19 DIAGNOSIS — E119 Type 2 diabetes mellitus without complications: Secondary | ICD-10-CM | POA: Diagnosis not present

## 2016-08-19 DIAGNOSIS — Z6828 Body mass index (BMI) 28.0-28.9, adult: Secondary | ICD-10-CM | POA: Diagnosis not present

## 2016-09-06 IMAGING — RF DG C-ARM 61-120 MIN
1 series · 5 of 5 positions shown · non-contrast
Comparison: None.

CLINICAL DATA: Left femur fracture.

EXAM:
LEFT FEMUR 2 VIEWS; DG C-ARM 61-120 MIN

[Series 1: run · 5 of 5 slices shown]
[im 1/5]
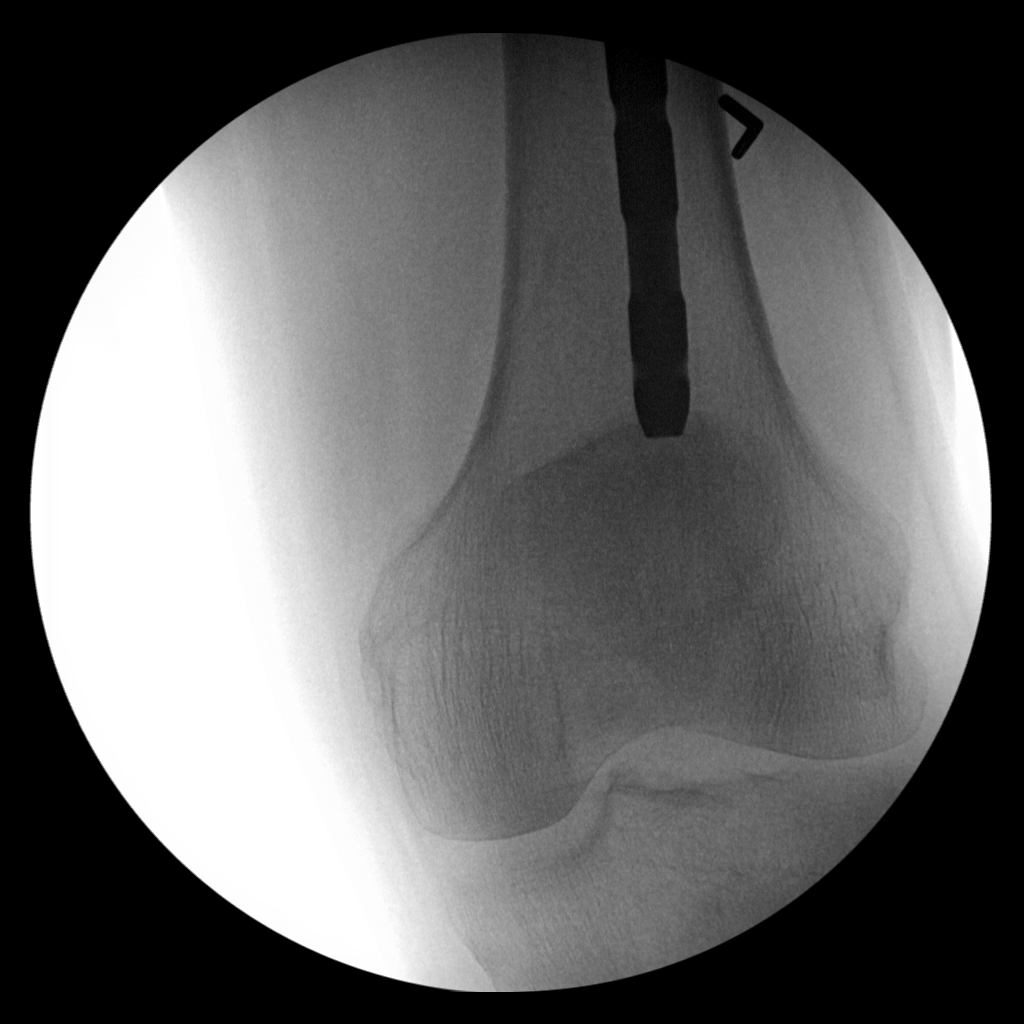
[im 2/5]
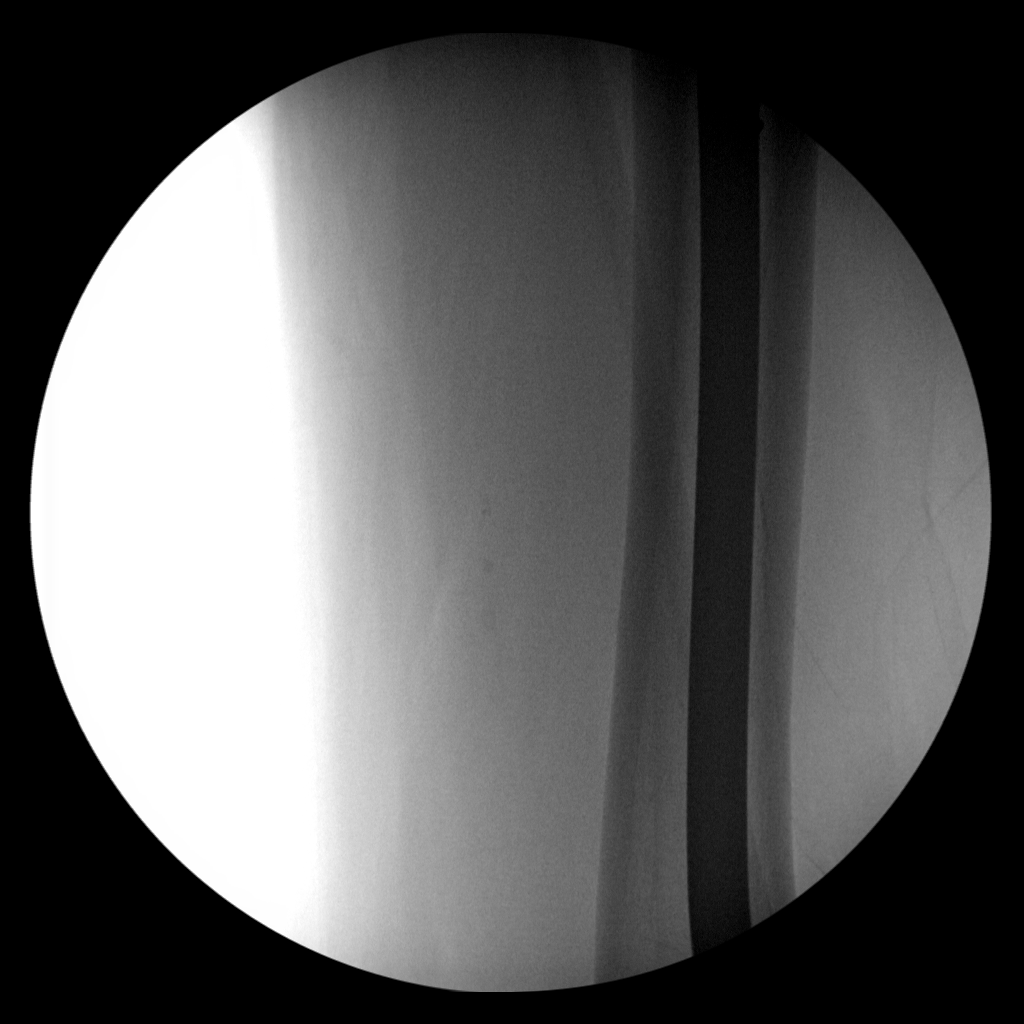
[im 3/5]
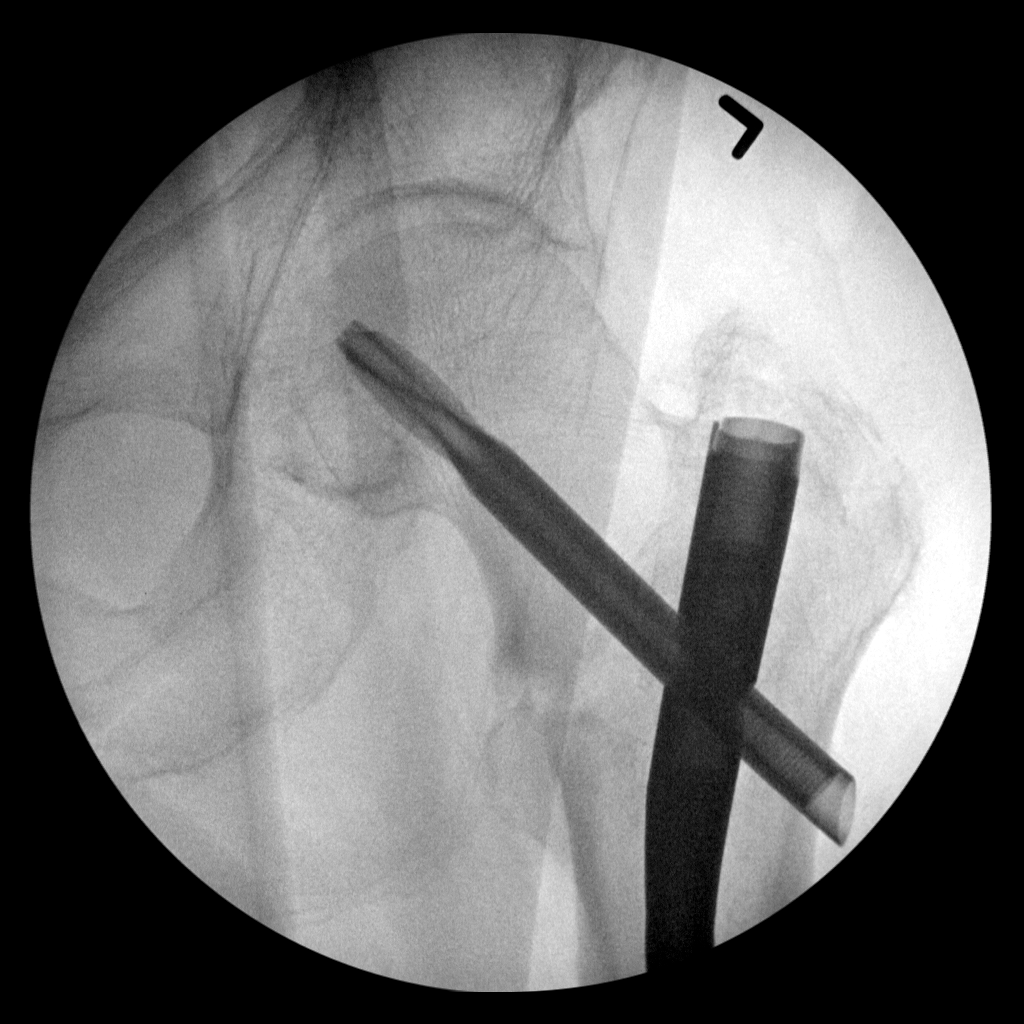
[im 4/5]
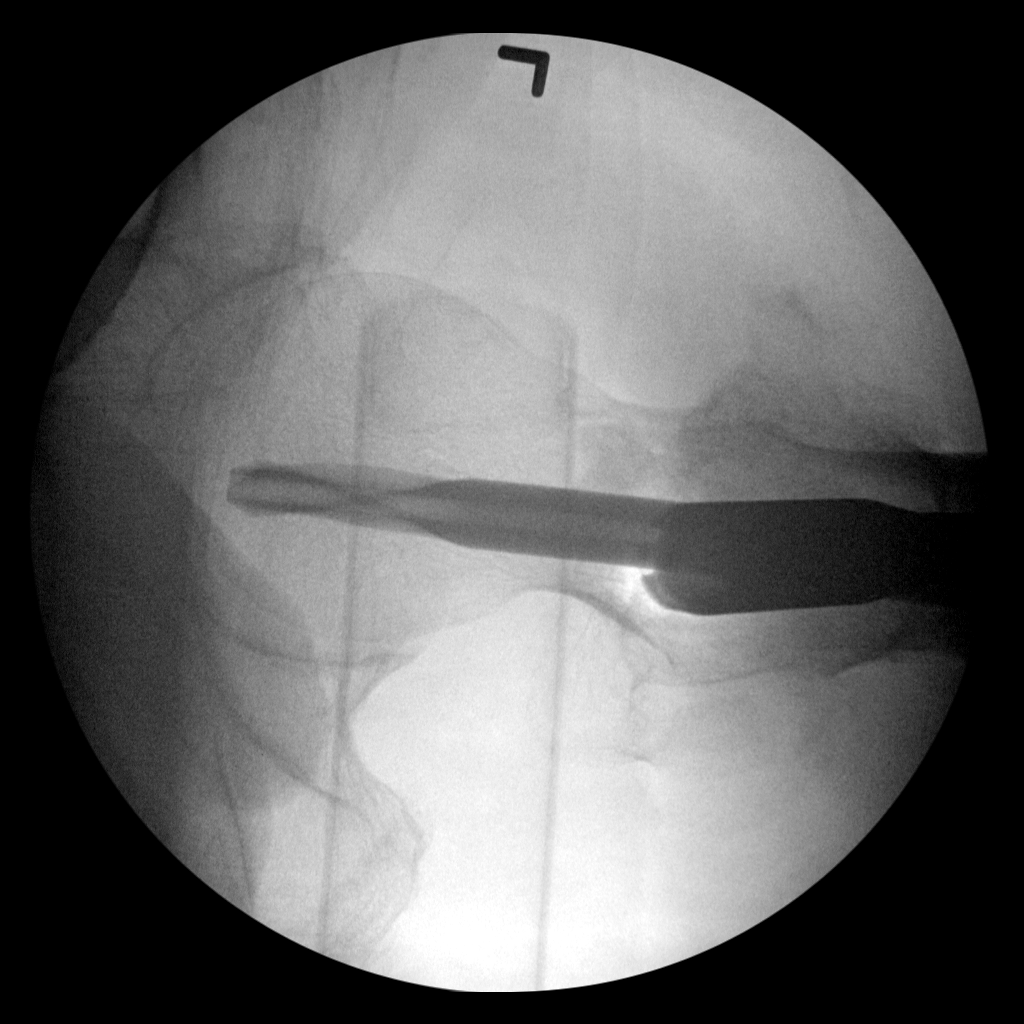
[im 5/5]
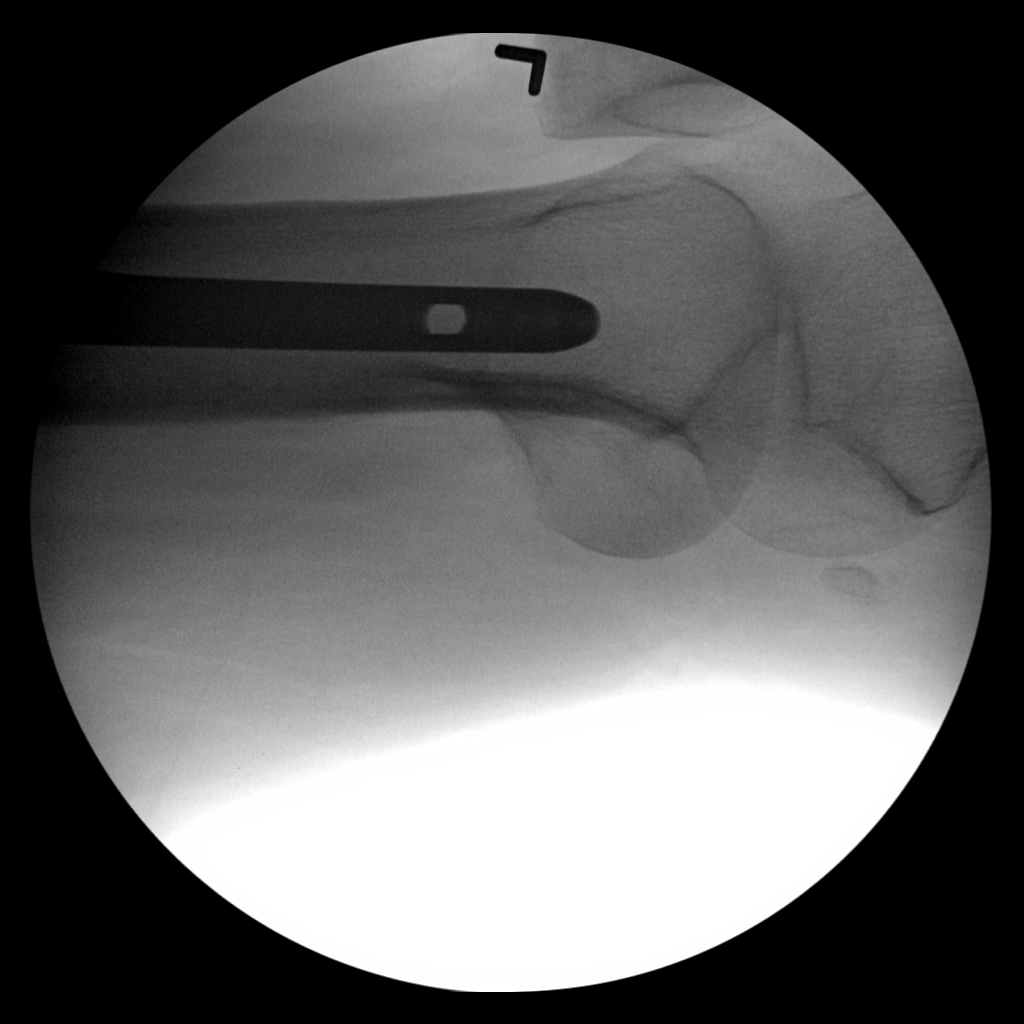

[5 of 5 positions shown; findings below may reference images not displayed]

FINDINGS: Five C-arm views of the left femur demonstrate intramedullary rod
and nail fixation of an intertrochanteric fracture. Anatomic
position and alignment on these views.
IMPRESSION: Hardware fixation of a left intertrochanteric fracture.

## 2016-09-06 IMAGING — CR DG HIP (WITH OR WITHOUT PELVIS) 1V PORT*L*
3 series · 3 of 3 positions shown · non-contrast
Comparison: 12/24/2014 intraoperative views.

CLINICAL DATA: ORIF left hip fracture.

EXAM:
DG HIP (WITH OR WITHOUT PELVIS) 1V PORT LEFT

[AP (1 of 3)]
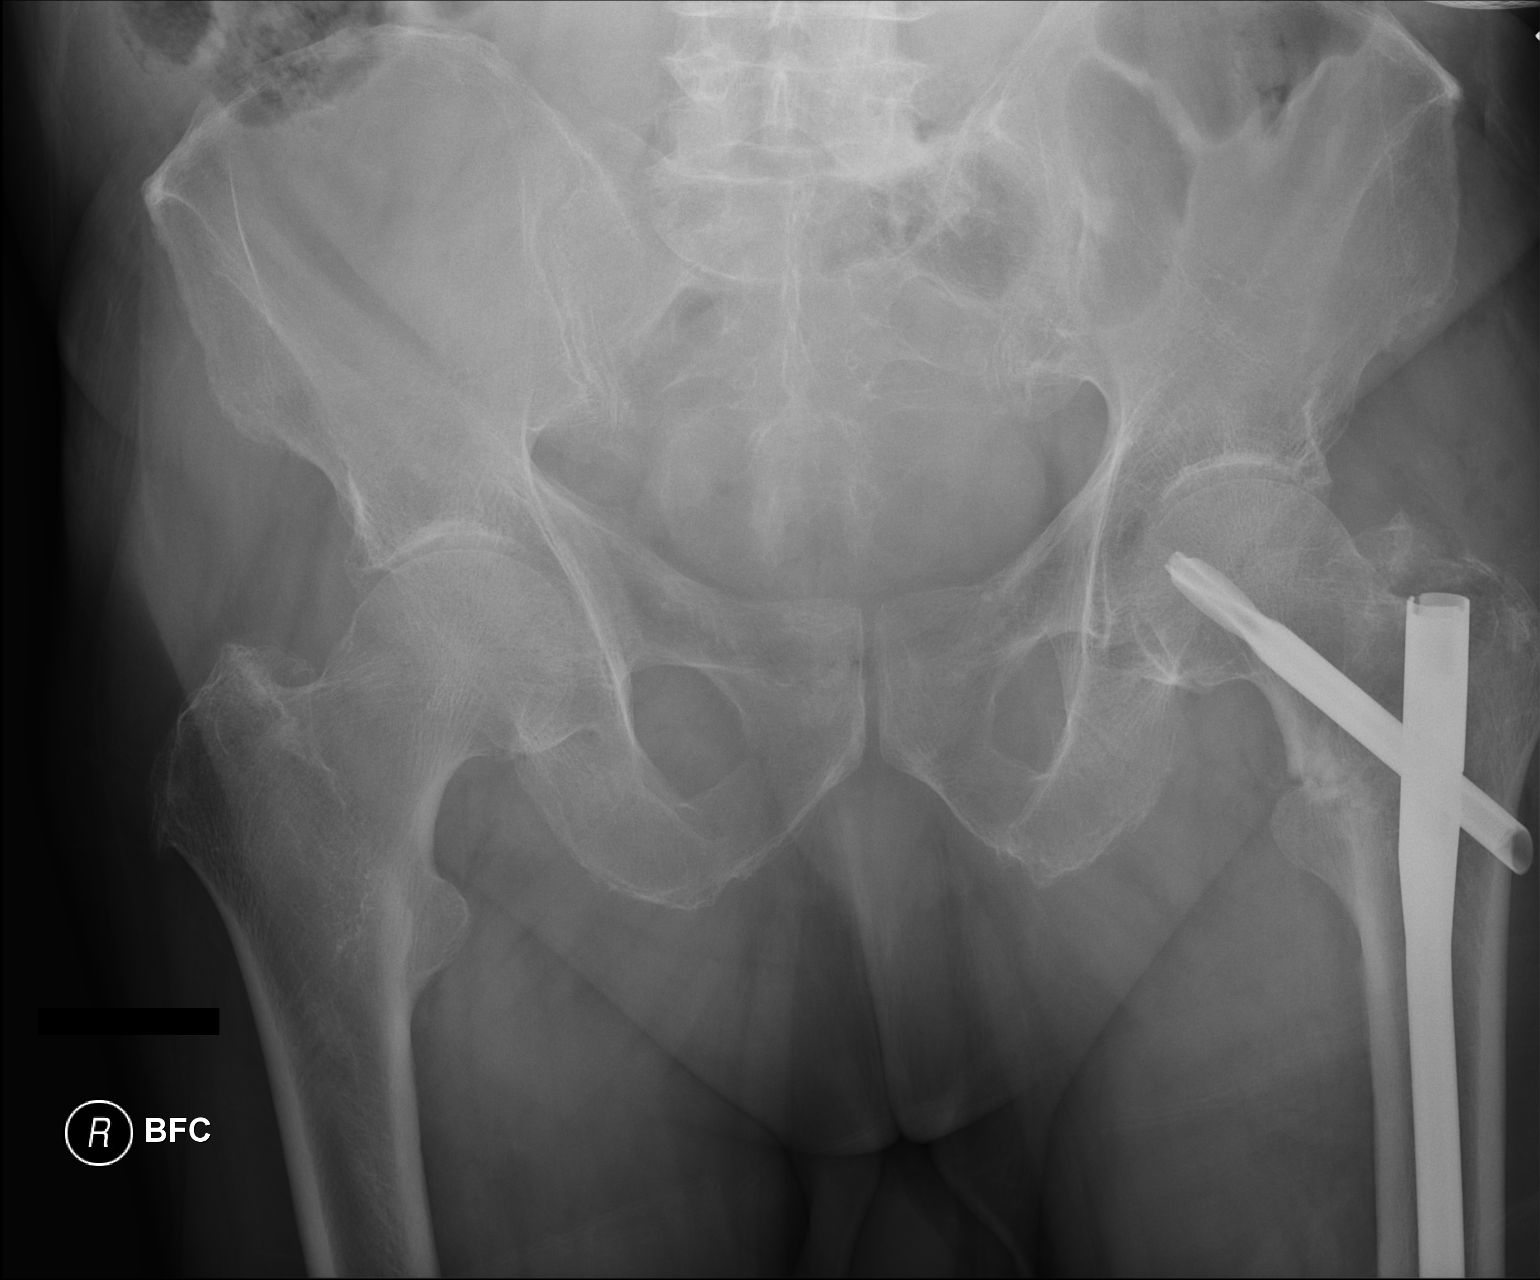

[AP (2 of 3)]
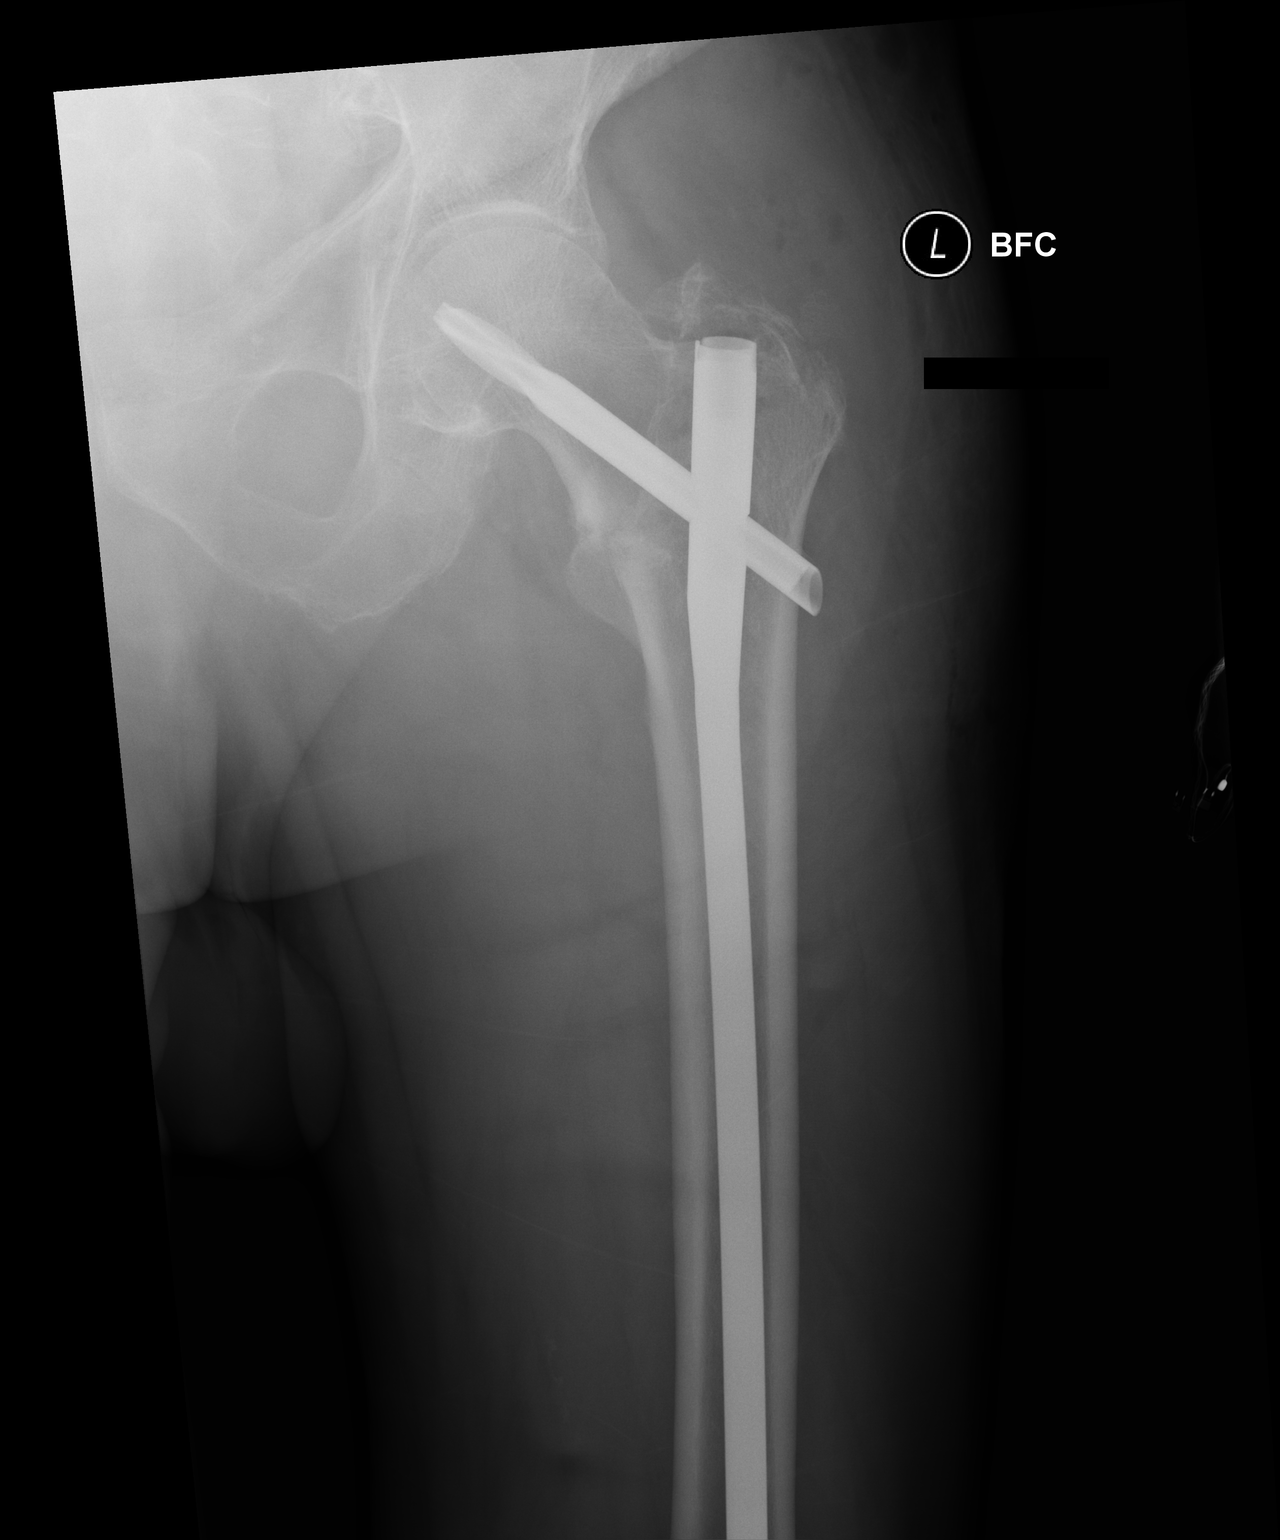

[AP (3 of 3)]
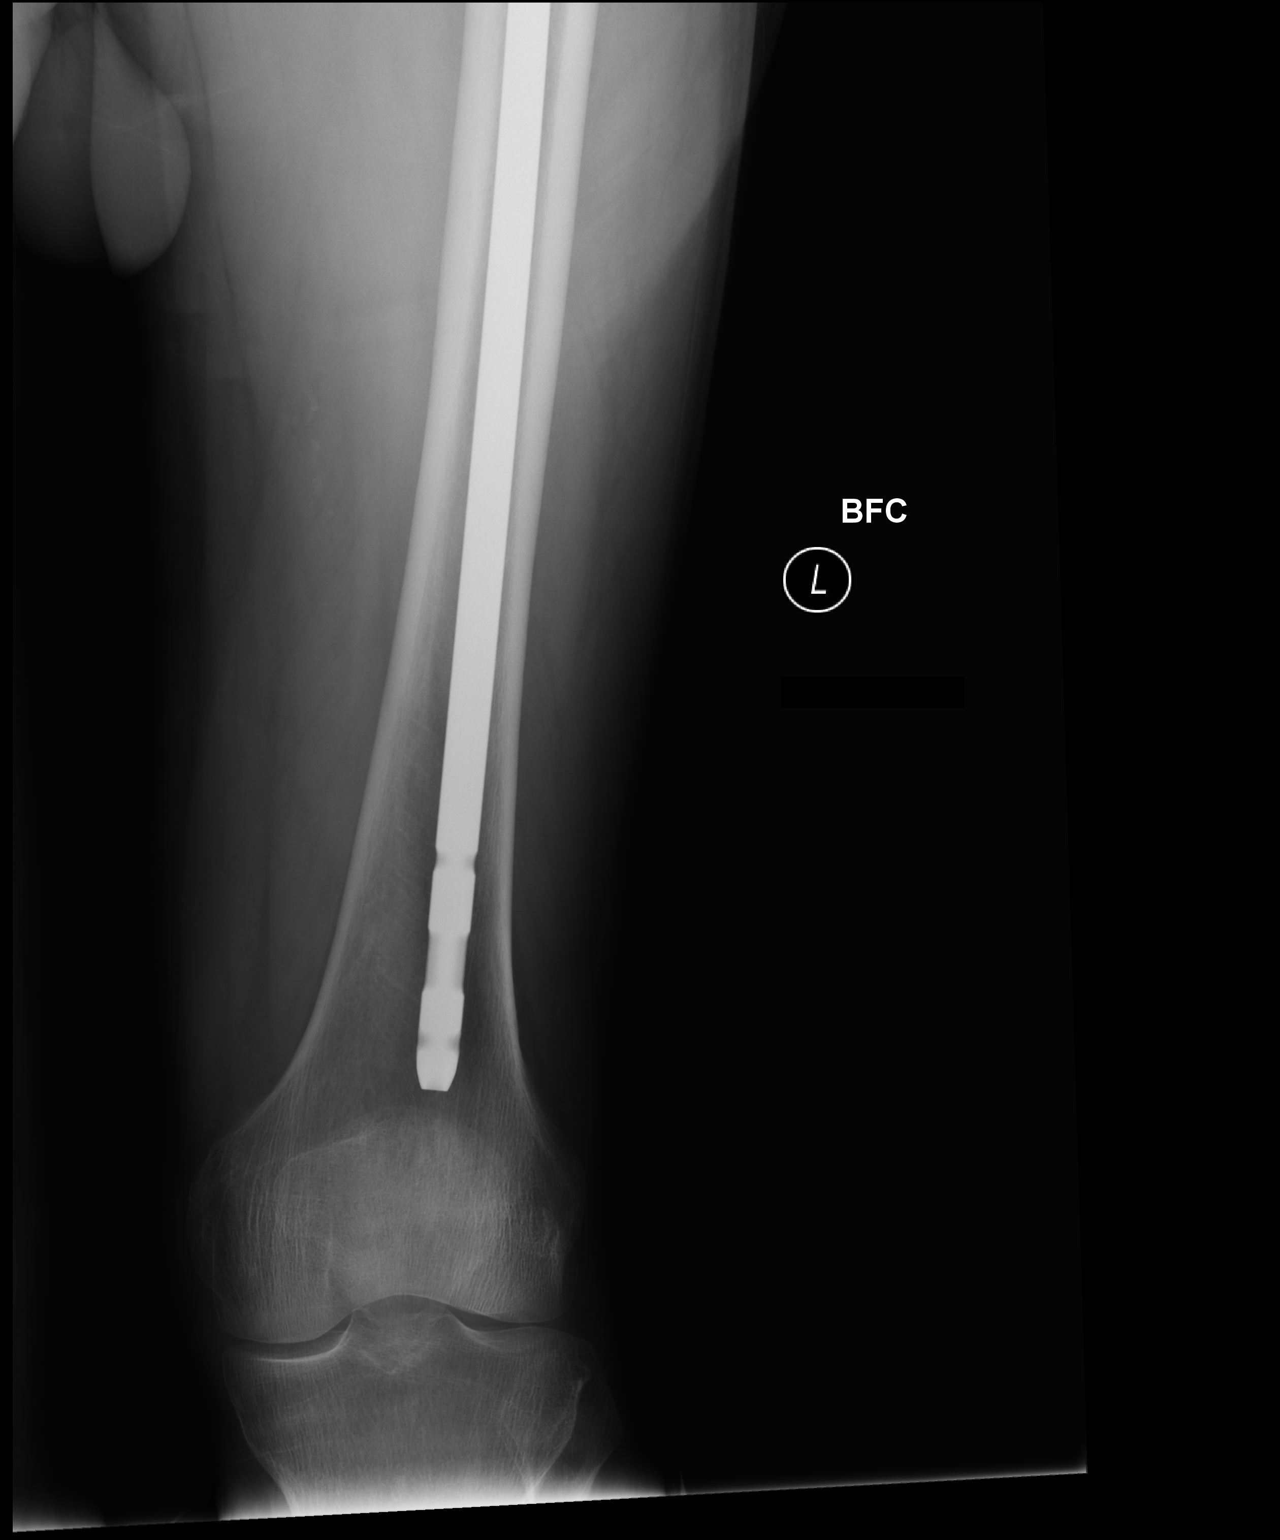

[3 of 3 positions shown; findings below may reference images not displayed]

FINDINGS: Intra medullary nail and delayed are noted traversing a left
intertrochanteric femur fracture in near-anatomic alignment and
position. No complicating features are identified.
IMPRESSION: ORIF left intertrochanteric femur fracture in near-anatomic
alignment and position.

## 2016-09-09 ENCOUNTER — Encounter (INDEPENDENT_AMBULATORY_CARE_PROVIDER_SITE_OTHER): Payer: Self-pay | Admitting: *Deleted

## 2016-09-23 ENCOUNTER — Ambulatory Visit (HOSPITAL_COMMUNITY)
Admission: RE | Admit: 2016-09-23 | Discharge: 2016-09-23 | Disposition: A | Discharge status: Home / Self Care | Payer: Medicare Other | Source: Ambulatory Visit | Attending: Internal Medicine | Admitting: Internal Medicine

## 2016-09-23 ENCOUNTER — Encounter (HOSPITAL_COMMUNITY): Payer: Self-pay | Admitting: *Deleted

## 2016-09-23 ENCOUNTER — Encounter (HOSPITAL_COMMUNITY)
Admission: RE | Disposition: A | Discharge status: Home / Self Care | Payer: Self-pay | Source: Ambulatory Visit | Attending: Internal Medicine

## 2016-09-23 DIAGNOSIS — K766 Portal hypertension: Secondary | ICD-10-CM | POA: Insufficient documentation

## 2016-09-23 DIAGNOSIS — K2970 Gastritis, unspecified, without bleeding: Secondary | ICD-10-CM | POA: Diagnosis not present

## 2016-09-23 DIAGNOSIS — Z8546 Personal history of malignant neoplasm of prostate: Secondary | ICD-10-CM | POA: Diagnosis not present

## 2016-09-23 DIAGNOSIS — Z885 Allergy status to narcotic agent status: Secondary | ICD-10-CM | POA: Diagnosis not present

## 2016-09-23 DIAGNOSIS — Z8719 Personal history of other diseases of the digestive system: Secondary | ICD-10-CM | POA: Diagnosis not present

## 2016-09-23 DIAGNOSIS — K228 Other specified diseases of esophagus: Secondary | ICD-10-CM | POA: Diagnosis not present

## 2016-09-23 DIAGNOSIS — K257 Chronic gastric ulcer without hemorrhage or perforation: Secondary | ICD-10-CM | POA: Insufficient documentation

## 2016-09-23 DIAGNOSIS — Z888 Allergy status to other drugs, medicaments and biological substances status: Secondary | ICD-10-CM | POA: Diagnosis not present

## 2016-09-23 DIAGNOSIS — K2950 Unspecified chronic gastritis without bleeding: Secondary | ICD-10-CM | POA: Diagnosis not present

## 2016-09-23 DIAGNOSIS — F17210 Nicotine dependence, cigarettes, uncomplicated: Secondary | ICD-10-CM | POA: Diagnosis not present

## 2016-09-23 DIAGNOSIS — Z833 Family history of diabetes mellitus: Secondary | ICD-10-CM | POA: Diagnosis not present

## 2016-09-23 DIAGNOSIS — E119 Type 2 diabetes mellitus without complications: Secondary | ICD-10-CM | POA: Diagnosis not present

## 2016-09-23 DIAGNOSIS — K3189 Other diseases of stomach and duodenum: Secondary | ICD-10-CM | POA: Insufficient documentation

## 2016-09-23 DIAGNOSIS — I10 Essential (primary) hypertension: Secondary | ICD-10-CM | POA: Diagnosis not present

## 2016-09-23 DIAGNOSIS — K449 Diaphragmatic hernia without obstruction or gangrene: Secondary | ICD-10-CM

## 2016-09-23 DIAGNOSIS — Z8619 Personal history of other infectious and parasitic diseases: Secondary | ICD-10-CM | POA: Insufficient documentation

## 2016-09-23 DIAGNOSIS — Z7901 Long term (current) use of anticoagulants: Secondary | ICD-10-CM | POA: Diagnosis not present

## 2016-09-23 HISTORY — PX: ESOPHAGOGASTRODUODENOSCOPY: SHX5428

## 2016-09-23 HISTORY — PX: BIOPSY: SHX5522

## 2016-09-23 HISTORY — DX: Gastro-esophageal reflux disease without esophagitis: K21.9

## 2016-09-23 SURGERY — EGD (ESOPHAGOGASTRODUODENOSCOPY)
Anesthesia: Moderate Sedation

## 2016-09-23 MED ORDER — LIDOCAINE VISCOUS 2 % MT SOLN
OROMUCOSAL | Status: DC | PRN
  Administered 2016-09-23: 4 mL via OROMUCOSAL

## 2016-09-23 MED ORDER — MEPERIDINE HCL 50 MG/ML IJ SOLN
INTRAMUSCULAR | Status: AC
  Filled 2016-09-23: qty 1

## 2016-09-23 MED ORDER — LIDOCAINE VISCOUS 2 % MT SOLN
OROMUCOSAL | Status: AC
  Filled 2016-09-23: qty 15

## 2016-09-23 MED ORDER — MIDAZOLAM HCL 5 MG/5ML IJ SOLN
INTRAMUSCULAR | Status: AC
  Filled 2016-09-23: qty 10

## 2016-09-23 MED ORDER — STERILE WATER FOR IRRIGATION IR SOLN
Status: DC | PRN
  Administered 2016-09-23: 08:00:00

## 2016-09-23 MED ORDER — RIVAROXABAN 20 MG PO TABS: 20.0000 mg | ORAL_TABLET | Freq: Every day | ORAL | 0 refills | Status: DC

## 2016-09-23 MED ORDER — FAMOTIDINE 20 MG PO TABS: 20.0000 mg | ORAL_TABLET | Freq: Every day | ORAL | Status: DC

## 2016-09-23 MED ORDER — SODIUM CHLORIDE 0.9 % IV SOLN
INTRAVENOUS | Status: DC
  Administered 2016-09-23: 08:00:00 via INTRAVENOUS

## 2016-09-23 MED ORDER — MIDAZOLAM HCL 5 MG/5ML IJ SOLN
INTRAMUSCULAR | Status: DC | PRN
  Administered 2016-09-23: 1 mg via INTRAVENOUS
  Administered 2016-09-23: 2 mg via INTRAVENOUS

## 2016-09-23 MED ORDER — MEPERIDINE HCL 50 MG/ML IJ SOLN
INTRAMUSCULAR | Status: DC | PRN
  Administered 2016-09-23: 25 mg via INTRAVENOUS

## 2016-09-23 NOTE — Discharge Instructions (Signed)
Resume Rivaroxaban on 09/24/2016. Resume other medications as before. Pepcid or famotidine 20 mg by mouth daily at bedtime. Resume usual diet. No driving for 24 hours. Physician will call with biopsy results.  Esophagogastroduodenoscopy, Care After Refer to this sheet in the next few weeks. These instructions provide you with information about caring for yourself after your procedure. Your health care provider may also give you more specific instructions. Your treatment has been planned according to current medical practices, but problems sometimes occur. Call your health care provider if you have any problems or questions after your procedure. What can I expect after the procedure? After the procedure, it is common to have:  A sore throat.  Nausea.  Bloating.  Dizziness.  Fatigue.  Follow these instructions at home:  Do not eat or drink anything until the numbing medicine (local anesthetic) has worn off and your gag reflex has returned. You will know that the local anesthetic has worn off when you can swallow comfortably.  Do not drive for 24 hours if you received a medicine to help you relax (sedative).  If your health care provider took a tissue sample for testing during the procedure, make sure to get your test results. This is your responsibility. Ask your health care provider or the department performing the test when your results will be ready.  Keep all follow-up visits as told by your health care provider. This is important. Contact a health care provider if:  You cannot stop coughing.  You are not urinating.  You are urinating less than usual. Get help right away if:  You have trouble swallowing.  You cannot eat or drink.  You have throat or chest pain that gets worse.  You are dizzy or light-headed.  You faint.  You have nausea or vomiting.  You have chills.  You have a fever.  You have severe abdominal pain.  You have black, tarry, or bloody  stools. This information is not intended to replace advice given to you by your health care provider. Make sure you discuss any questions you have with your health care provider. Document Released: 03/25/2012 Document Revised: 09/14/2015 Document Reviewed: 03/02/2015 Elsevier Interactive Patient Education  [employer].

## 2016-09-23 NOTE — H&P (Signed)
Steven Boyd is an 76 y.o. male.   Chief Complaint: Patient is here for EGD. HPI: Patient is 76 year old Caucasian male who presented with chronic epigastric pain and underwent EGD in February this year and found to have gastric ulcer at angularis. Biopsy revealed benign etiology. H. pylori was positive and is been treated. He is returning for EGD to document complete healing of this ulcer. He denies nausea vomiting heartburn dysphagia abdominal pain melena or rectal bleeding. It is unclear if he has been taking omeprazole or not. Anticoagulant is on hold.  Past Medical History:  Diagnosis Date  . Diabetes mellitus without complication (HCC)    diet controlled  . GERD (gastroesophageal reflux disease)   . Hard of hearing    bilateral, no hearing aids  . Hypertension   . Prostate cancer [facility])     Past Surgical History:  Procedure Laterality Date  . COLONOSCOPY N/A 05/08/2016   Procedure: COLONOSCOPY;  Surgeon: Rogene Houston, MD;  Location[facility];  Service: Endoscopy;  Laterality: N/A;  2:25-moved to 100 per Ann  . ESOPHAGOGASTRODUODENOSCOPY N/A 06/07/2016   Procedure: ESOPHAGOGASTRODUODENOSCOPY (EGD);  Surgeon: Rogene Houston, MD;  Location: [facility];  Service: Endoscopy;  Laterality: N/A;  915  . FEMUR IM NAIL Left 12/24/2014   Procedure: INTRAMEDULLARY (IM) NAIL FEMORAL;  Surgeon: Marchia Bond, MD;  Location: [place];  Service: Orthopedics;  Laterality: Left;  . POLYPECTOMY  05/08/2016   Procedure: POLYPECTOMY;  Surgeon: Rogene Houston, MD;  Location: [facility];  Service: Endoscopy;;  sigmoid colon polypectomies x3     Family History  Problem Relation Age of Onset  . Diabetes Mother   . Heart Problems Father        s/p heart valve repair 10 years before he passed away   Social History:  reports that he has been smoking Cigarettes.  He has a 55.00 pack-year smoking history. He has never used smokeless tobacco. He reports that he does not drink alcohol or use  drugs.  Allergies:  Allergies  Allergen Reactions  . Protonix [Pantoprazole Sodium] Nausea And Vomiting    Patient states that after taking a few doses this happened , he stopped the medication.  . Dilaudid [Hydromorphone Hcl] Nausea And Vomiting    Medications Prior to Admission  Medication Sig Dispense Refill  . atenolol (TENORMIN) 50 MG tablet Take 50 mg by mouth daily.    Marland Kitchen lisinopril (PRINIVIL,ZESTRIL) 20 MG tablet Take 40 mg by mouth 2 (two) times daily.    . Omega-3 Fatty Acids (FISH OIL) 1200 MG CAPS Take 1,200 mg by mouth daily.     . rivaroxaban (XARELTO) 20 MG TABS tablet Take 1 tablet (20 mg total) by mouth daily with supper. 28 tablet 0  . tamsulosin (FLOMAX) 0.4 MG CAPS capsule Take 0.4 mg by mouth daily.       No results found for this or any previous visit (from the past 48 hour(s)). No results found.  ROS  Blood pressure (!) 136/100, pulse 70, temperature 97.8 F (36.6 C), temperature source Oral, resp. rate 13, height 6\' 2"  (1.88 m), weight 225 lb (102.1 kg), SpO2 96 %. Physical Exam   Assessment/Plan History of gastric ulcer. EGD to document healing.  Hildred Laser, MD 09/23/2016, 8:09 AM

## 2016-09-23 NOTE — Op Note (Signed)
[facility] Patient Name: Steven Boyd Procedure Date: 09/23/2016 7:54 AM MRN: [id number] Date of Birth: 1940/12/09 Attending MD: Hildred Laser , MD CSN: [id number] Age: 76 Admit Type: Outpatient Procedure:                Upper GI endoscopy Indications:              Follow-up of chronic gastric ulcer Providers:                Hildred Laser, MD, Otis Peak B. Sharon Seller, RN, Aram Candela Referring MD:             Redmond School, MD Medicines:                Lidocaine spray, Meperidine 25 mg IV, Midazolam 3                            mg IV Complications:            No immediate complications. Estimated Blood Loss:     Estimated blood loss was minimal. Procedure:                Pre-Anesthesia Assessment:                           - Prior to the procedure, a History and Physical                            was performed, and patient medications and                            allergies were reviewed. The patient's tolerance of                            previous anesthesia was also reviewed. The risks                            and benefits of the procedure and the sedation                            options and risks were discussed with the patient.                            All questions were answered, and informed consent                            was obtained. Prior Anticoagulants: The patient                            last took Xarelto (rivaroxaban) 3 days prior to the                            procedure. ASA Grade Assessment: II - A patient  with mild systemic disease. After reviewing the                            risks and benefits, the patient was deemed in                            satisfactory condition to undergo the procedure.                           After obtaining informed consent, the endoscope was                            passed under direct vision. Throughout the                            procedure, the  patient's blood pressure, pulse, and                            oxygen saturations were monitored continuously. The                            EG-[id number] ([id number]) scope was introduced through the                            mouth, and advanced to the second part of duodenum.                            The upper GI endoscopy was accomplished without                            difficulty. The patient tolerated the procedure                            well. Scope In: 8:19:53 AM Scope Out: 8:25:53 AM Total Procedure Duration: 0 hours 6 minutes 0 seconds  Findings:      The examined esophagus was normal.      The Z-line was irregular and was found 40 cm from the incisors.      A 2 cm hiatal hernia was present.      Mild portal hypertensive gastropathy was found in the gastric fundus and       in the gastric body.      A healed ulcer was found at the incisura.      Diffuse mild inflammation characterized by congestion (edema), erythema       and granularity was found in the gastric antrum and in the prepyloric       region of the stomach. Biopsies were taken with a cold forceps for       histology.      The duodenal bulb and second portion of the duodenum were normal. Impression:               - Normal esophagus.                           - Z-line irregular, 40 cm from the incisors.                           -  2 cm hiatal hernia.                           - Portal hypertensive gastropathy.                           - Scar in the incisura.                           - Gastritis. Biopsied.                           - Normal duodenal bulb and second portion of the                            duodenum. Moderate Sedation:      Moderate (conscious) sedation was administered by the endoscopy nurse       and supervised by the endoscopist. The following parameters were       monitored: oxygen saturation, heart rate, blood pressure, CO2       capnography and response to care. Total physician  intraservice time was       10 minutes. Recommendation:           - Patient has a contact number available for                            emergencies. The signs and symptoms of potential                            delayed complications were discussed with the                            patient. Return to normal activities tomorrow.                            Written discharge instructions were provided to the                            patient.                           - Resume previous diet today.                           - Continue present medications.                           - Resume Xarelto (rivaroxaban) at prior dose                            tomorrow.                           - Await pathology results.                           - Pepcid OTC 20 mg by mouth daily at bedtime. Procedure Code(s):        ---  Professional ---                           [phone], Esophagogastroduodenoscopy, flexible,                            transoral; with biopsy, single or multiple                           99152, Moderate sedation services provided by the                            same physician or other qualified health care                            professional performing the diagnostic or                            therapeutic service that the sedation supports,                            requiring the presence of an independent trained                            observer to assist in the monitoring of the                            patient's level of consciousness and physiological                            status; initial 15 minutes of intraservice time,                            patient age 69 years or older Diagnosis Code(s):        --- Professional ---                           K22.8, Other specified diseases of esophagus                           K44.9, Diaphragmatic hernia without obstruction or                            gangrene                           K76.6, Portal hypertension                            K31.89, Other diseases of stomach and duodenum                           K29.70, Gastritis, unspecified, without bleeding                           K25.7, Chronic gastric ulcer without hemorrhage or  perforation CPT copyright 2016 American Medical Association. All rights reserved. The codes documented in this report are preliminary and upon coder review may  be revised to meet current compliance requirements. Hildred Laser, MD Hildred Laser, MD 09/23/2016 8:36:12 AM This report has been signed electronically. Number of Addenda: 0

## 2016-09-30 ENCOUNTER — Encounter (HOSPITAL_COMMUNITY): Payer: Self-pay | Admitting: Internal Medicine

## 2016-11-05 DIAGNOSIS — Z6830 Body mass index (BMI) 30.0-30.9, adult: Secondary | ICD-10-CM | POA: Diagnosis not present

## 2016-11-05 DIAGNOSIS — F17290 Nicotine dependence, other tobacco product, uncomplicated: Secondary | ICD-10-CM | POA: Diagnosis not present

## 2016-11-05 DIAGNOSIS — Z1389 Encounter for screening for other disorder: Secondary | ICD-10-CM | POA: Diagnosis not present

## 2016-11-05 DIAGNOSIS — I10 Essential (primary) hypertension: Secondary | ICD-10-CM | POA: Diagnosis not present

## 2016-11-05 DIAGNOSIS — Z719 Counseling, unspecified: Secondary | ICD-10-CM | POA: Diagnosis not present

## 2016-11-05 DIAGNOSIS — Z0000 Encounter for general adult medical examination without abnormal findings: Secondary | ICD-10-CM | POA: Diagnosis not present

## 2017-01-13 DIAGNOSIS — Z0000 Encounter for general adult medical examination without abnormal findings: Secondary | ICD-10-CM | POA: Diagnosis not present

## 2017-01-13 DIAGNOSIS — E6609 Other obesity due to excess calories: Secondary | ICD-10-CM | POA: Diagnosis not present

## 2017-01-13 DIAGNOSIS — Z6830 Body mass index (BMI) 30.0-30.9, adult: Secondary | ICD-10-CM | POA: Diagnosis not present

## 2017-01-13 DIAGNOSIS — Z1389 Encounter for screening for other disorder: Secondary | ICD-10-CM | POA: Diagnosis not present

## 2017-01-20 DIAGNOSIS — R600 Localized edema: Secondary | ICD-10-CM | POA: Diagnosis not present

## 2017-01-20 DIAGNOSIS — E1140 Type 2 diabetes mellitus with diabetic neuropathy, unspecified: Secondary | ICD-10-CM | POA: Diagnosis not present

## 2017-01-20 DIAGNOSIS — Z6830 Body mass index (BMI) 30.0-30.9, adult: Secondary | ICD-10-CM | POA: Diagnosis not present

## 2017-01-20 DIAGNOSIS — Z23 Encounter for immunization: Secondary | ICD-10-CM | POA: Diagnosis not present

## 2017-01-20 DIAGNOSIS — I8310 Varicose veins of unspecified lower extremity with inflammation: Secondary | ICD-10-CM | POA: Diagnosis not present

## 2017-01-20 DIAGNOSIS — K219 Gastro-esophageal reflux disease without esophagitis: Secondary | ICD-10-CM | POA: Diagnosis not present

## 2017-01-20 DIAGNOSIS — R201 Hypoesthesia of skin: Secondary | ICD-10-CM | POA: Diagnosis not present

## 2017-01-20 DIAGNOSIS — I10 Essential (primary) hypertension: Secondary | ICD-10-CM | POA: Diagnosis not present

## 2017-01-20 DIAGNOSIS — F17290 Nicotine dependence, other tobacco product, uncomplicated: Secondary | ICD-10-CM | POA: Diagnosis not present

## 2017-01-20 DIAGNOSIS — B351 Tinea unguium: Secondary | ICD-10-CM | POA: Diagnosis not present

## 2017-01-20 DIAGNOSIS — I872 Venous insufficiency (chronic) (peripheral): Secondary | ICD-10-CM | POA: Diagnosis not present

## 2017-01-28 ENCOUNTER — Ambulatory Visit (INDEPENDENT_AMBULATORY_CARE_PROVIDER_SITE_OTHER): Payer: Medicare Other | Admitting: Urology

## 2017-01-28 DIAGNOSIS — R351 Nocturia: Secondary | ICD-10-CM | POA: Diagnosis not present

## 2017-01-28 DIAGNOSIS — D696 Thrombocytopenia, unspecified: Secondary | ICD-10-CM | POA: Diagnosis not present

## 2017-01-28 DIAGNOSIS — C61 Malignant neoplasm of prostate: Secondary | ICD-10-CM | POA: Diagnosis not present

## 2017-01-28 DIAGNOSIS — N401 Enlarged prostate with lower urinary tract symptoms: Secondary | ICD-10-CM | POA: Diagnosis not present

## 2017-02-04 DIAGNOSIS — I70203 Unspecified atherosclerosis of native arteries of extremities, bilateral legs: Secondary | ICD-10-CM | POA: Diagnosis not present

## 2017-02-04 DIAGNOSIS — D2371 Other benign neoplasm of skin of right lower limb, including hip: Secondary | ICD-10-CM | POA: Diagnosis not present

## 2017-02-04 DIAGNOSIS — B351 Tinea unguium: Secondary | ICD-10-CM | POA: Diagnosis not present

## 2017-02-04 DIAGNOSIS — M216X1 Other acquired deformities of right foot: Secondary | ICD-10-CM | POA: Diagnosis not present

## 2017-04-18 ENCOUNTER — Telehealth: Payer: Self-pay | Admitting: *Deleted

## 2017-04-18 NOTE — Telephone Encounter (Signed)
   [facility] Pre-operative Risk Assessment    Request for surgical clearance:  1. What type of surgery is being performed? In office prostate biopsy   2. When is this surgery scheduled? 08-01-17   3. Are there any medications that need to be held prior to surgery and how long? Xarelto for 5 days   4. Practice name and name of physician performing surgery? [facility]    5. What is your office phone and fax number? [phone] Fax: [phone]   6. Anesthesia type (None, local, MAC, general) ? None provided   Steven Boyd 04/18/2017, 4:54 PM  _________________________________________________________________   (provider comments below)

## 2017-04-21 NOTE — Telephone Encounter (Signed)
Contacted pt.  He hasn't seen anyone from Cardiology. He is scheduled 05/20/17 to see Almyra Deforest, PA-C, for cardiac clearance. Pt thanked me for the call.

## 2017-04-21 NOTE — Telephone Encounter (Signed)
   Chart reviewed as part of pre-operative protocol coverage. Because of Steven Boyd's past medical history and time since last visit, he/she will require a follow-up visit in order to better assess preoperative cardiovascular risk. He seen in January 2018 by Almyra Deforest PA-C for atrial fib, begun on Xarelto. It looks like Dr. Percival Spanish addended that note. He saw Dr. Gwenlyn Found in 05/2016 at which time EKG was reported to show atrial flutter. Dr. Gwenlyn Found evaluated him for PAD at that visit and recommended f/u PRN. The patient does not have any further follow-up scheduled with our office. Steven Boyd's notes indicate the patient was previously followed by [facility] so it is unclear to me if perhaps he has been followed there in the interim.   If he is followed by [facility], clearance will need to be re-routed to his primary cardiologist with that group. If intention is to follow with [place] long-term, please schedule patient for follow-up in January 2019 - with Isaac Laud or Dr. Gwenlyn Found if possible.  Pre-op covering staff: - Please schedule appointment and call patient to inform them. - Please contact requesting surgeon's office via preferred method (i.e, phone, fax) to inform them of need for appointment prior to surgery.  Steven Pitter, PA-C  04/21/2017, 12:38 PM

## 2017-04-29 DIAGNOSIS — L84 Corns and callosities: Secondary | ICD-10-CM | POA: Diagnosis not present

## 2017-04-29 DIAGNOSIS — B351 Tinea unguium: Secondary | ICD-10-CM | POA: Diagnosis not present

## 2017-04-29 DIAGNOSIS — M79671 Pain in right foot: Secondary | ICD-10-CM | POA: Diagnosis not present

## 2017-05-20 ENCOUNTER — Ambulatory Visit: Payer: 59 | Admitting: Physician Assistant

## 2017-05-26 ENCOUNTER — Encounter: Payer: Self-pay | Admitting: Physician Assistant

## 2017-05-26 ENCOUNTER — Ambulatory Visit (INDEPENDENT_AMBULATORY_CARE_PROVIDER_SITE_OTHER): Payer: Medicare Other | Admitting: Physician Assistant

## 2017-05-26 VITALS — BP 162/80 | HR 48 | Ht 74.0 in | Wt 240.0 lb

## 2017-05-26 DIAGNOSIS — Z720 Tobacco use: Secondary | ICD-10-CM

## 2017-05-26 DIAGNOSIS — I4891 Unspecified atrial fibrillation: Secondary | ICD-10-CM | POA: Diagnosis not present

## 2017-05-26 DIAGNOSIS — E119 Type 2 diabetes mellitus without complications: Secondary | ICD-10-CM

## 2017-05-26 DIAGNOSIS — I731 Thromboangiitis obliterans [Buerger's disease]: Secondary | ICD-10-CM | POA: Insufficient documentation

## 2017-05-26 DIAGNOSIS — I739 Peripheral vascular disease, unspecified: Secondary | ICD-10-CM | POA: Diagnosis not present

## 2017-05-26 DIAGNOSIS — I10 Essential (primary) hypertension: Secondary | ICD-10-CM | POA: Diagnosis not present

## 2017-05-26 DIAGNOSIS — I440 Atrioventricular block, first degree: Secondary | ICD-10-CM | POA: Diagnosis not present

## 2017-05-26 MED ORDER — HYDRALAZINE HCL 25 MG PO TABS: 25.0000 mg | ORAL_TABLET | Freq: Two times a day (BID) | ORAL | 3 refills | Status: DC

## 2017-05-26 MED ORDER — HYDRALAZINE HCL 25 MG PO TABS: 25.0000 mg | ORAL_TABLET | Freq: Three times a day (TID) | ORAL | 3 refills | Status: DC

## 2017-05-26 MED ORDER — ATENOLOL 25 MG PO TABS: 25.0000 mg | ORAL_TABLET | Freq: Two times a day (BID) | ORAL | 3 refills | Status: DC

## 2017-05-26 NOTE — Progress Notes (Signed)
Cardiology Office Note:    Date:  05/26/2017   ID:  ARTEMUS ROMANOFF, DOB 1941-04-03, MRN [id number]  PCP:  Redmond School, MD  Cardiologist:  Minus Breeding, MD   Referring MD: Redmond School, MD   Chief Complaint  Patient presents with  . Medical Clearance    History of Present Illness:    Steven Boyd is a 77 y.o. male with a hx of atrial fibrillation found during colonoscopy (Jan 2018) on xarelto, HTN, diet-controlled diabetes,stress echo in 2015 negative for inducible wall motion abnormality, hx of Buerger's syndrome (1990s), and current every day smoker.   Steven Boyd was seen 05/10/16 by Almyra Deforest PA-C for newly diagnosed Afib. He is largely unaware of his Afib. He was started on xarelto at that time. Dr. Percival Spanish addended that note with plans for rate control and anticoagulation. It does not appear that cardioversion was discussed.    He was referred to Dr. Gwenlyn Found for claudication. ABIs and TBIs were normal on 06/24/16, no further intervention. He was encouraged to stop smoking.   Today, he presents for follow up and for cardiac clearance for in-office prostate biopsies with urology. EKG with sinus bradycardia with first degree heart block. He denies dizziness, lightheadedness, and syncope. He has been compliant on all medications, including xarelto.  He continues to be unaware of his Afib or bradycardia.   He denies exertional chest pain, SOB, DOE, dizziness, and palpitations. He continues to have some lower extremity swelling from his Buerger's disease, this is at his baseline. He is cleared for prostate procedure without further cardiac workup. He may hold xarelto 3 days prior to procedure.   His pressures at home run 140-150s/80-90s. He is taking 40 mg lisinopril BID. Today, he has not taken his morning medications.  We had a long discussion about smoking cessation.    Past Medical History:  Diagnosis Date  . Diabetes mellitus without complication (HCC)    diet  controlled  . GERD (gastroesophageal reflux disease)   . Hard of hearing    bilateral, no hearing aids  . Hypertension   . Prostate cancer [facility])     Past Surgical History:  Procedure Laterality Date  . BIOPSY  09/23/2016   Procedure: BIOPSY;  Surgeon: Rogene Houston, MD;  [facility];  Service: Endoscopy;;  gastric  . COLONOSCOPY N/A 05/08/2016   Procedure: COLONOSCOPY;  Surgeon: Rogene Houston, MD;  [facility];  Service: Endoscopy;  Laterality: N/A;  2:25-moved to 100 per Ann  . ESOPHAGOGASTRODUODENOSCOPY N/A 06/07/2016   Procedure: ESOPHAGOGASTRODUODENOSCOPY (EGD);  Surgeon: Rogene Houston, MD;  [facility];  Service: Endoscopy;  Laterality: N/A;  915  . ESOPHAGOGASTRODUODENOSCOPY N/A 09/23/2016   Procedure: ESOPHAGOGASTRODUODENOSCOPY (EGD);  Surgeon: Rogene Houston, MD;  [facility];  Service: Endoscopy;  Laterality: N/A;  955  . FEMUR IM NAIL Left 12/24/2014   Procedure: INTRAMEDULLARY (IM) NAIL FEMORAL;  Surgeon: Marchia Bond, MD;  Location: [place];  Service: Orthopedics;  Laterality: Left;  . POLYPECTOMY  05/08/2016   Procedure: POLYPECTOMY;  Surgeon: Rogene Houston, MD;  [facility];  Service: Endoscopy;;  sigmoid colon polypectomies x3     Current Medications: Current Meds  Medication Sig  . atenolol (TENORMIN) 25 MG tablet Take 1 tablet (25 mg total) by mouth 2 (two) times daily.  Marland Kitchen lisinopril (PRINIVIL,ZESTRIL) 20 MG tablet Take 40 mg by mouth 2 (two) times daily.  . Omega-3 Fatty Acids (FISH OIL)  1200 MG CAPS Take 1,200 mg by mouth daily.   . rivaroxaban (XARELTO) 20 MG TABS tablet Take 1 tablet (20 mg total) by mouth daily with supper.  . tamsulosin (FLOMAX) 0.4 MG CAPS capsule Take 0.4 mg by mouth daily.   . [DISCONTINUED] atenolol (TENORMIN) 50 MG tablet Take 50 mg by mouth daily.     Allergies:   Protonix [pantoprazole sodium] and Dilaudid [hydromorphone hcl]   Social History   Socioeconomic History  .  Marital status: Married    Spouse name: None  . Number of children: None  . Years of education: None  . Highest education level: None  Social Needs  . Financial resource strain: None  . Food insecurity - worry: None  . Food insecurity - inability: None  . Transportation needs - medical: None  . Transportation needs - non-medical: None  Occupational History  . None  Tobacco Use  . Smoking status: Current Every Day Smoker    Packs/day: 1.00    Years: 55.00    Pack years: 55.00    Types: Cigarettes  . Smokeless tobacco: Never Used  Substance and Sexual Activity  . Alcohol use: No  . Drug use: No  . Sexual activity: None  Other Topics Concern  . None  Social History Narrative  . None     Family History: The patient's family history includes Diabetes in his mother; Heart Problems in his father.  ROS:   Please see the history of present illness.    All other systems reviewed and are negative.  EKGs/Labs/Other Studies Reviewed:    The following studies were reviewed today:  Doppler study 06/2016: normal ABIs and TBIs   EKG:  EKG is ordered today.  The ekg ordered today demonstrates sinus bradycardia with first degree heart block.   Recent Labs: 06/24/2016: ALT 24; BUN 14; Creatinine, Ser 1.04; Hemoglobin 16.1; Platelets 166; Potassium 4.1; Sodium 139  Recent Lipid Panel    Component Value Date/Time   CHOL 161 05/13/2016 0837   TRIG 82 05/13/2016 0837   HDL 42 05/13/2016 0837   CHOLHDL 3.8 05/13/2016 0837   VLDL 16 05/13/2016 0837   LDLCALC 103 (H) 05/13/2016 0837    Physical Exam:    VS:  BP (!) 162/80   Pulse (!) 48   Ht 6\' 2"  (1.88 m)   Wt 240 lb (108.9 kg)   BMI 30.81 kg/m     Wt Readings from Last 3 Encounters:  05/26/17 240 lb (108.9 kg)  09/23/16 225 lb (102.1 kg)  08/13/16 225 lb 11.2 oz (102.4 kg)     GEN:  Well nourished, well developed in no acute distress HEENT: Normal NECK: No JVD; No carotid bruits LYMPHATICS: No  lymphadenopathy CARDIAC: RRR, no murmurs, rubs, gallops RESPIRATORY:  Clear to auscultation without rales, wheezing or rhonchi  ABDOMEN: Soft, non-tender, non-distended MUSCULOSKELETAL:  Trace edema with chronic skin changes. No deformity  SKIN: Warm and dry NEUROLOGIC:  Alert and oriented x 3 PSYCHIATRIC:  Normal affect   ASSESSMENT:   1. Essential hypertension   2. Peripheral arterial disease ([place])   3. First degree heart block   4. Atrial fibrillation with controlled ventricular rate ([place])   5. Diet-controlled diabetes mellitus ([place])   6. Tobacco use    PLAN:    In order of problems listed above:  Essential hypertension Pressures have been well-controlled. Current medications include atenolol and lisinopril. Decreased atenolol to 25 mg daily for sinus bradycardia and first degree heart block. Added  hydralazine 25 mg BID. Avoided norvasc for long-standing lower extremity swelling. Discussed HCTZ, but patient is already not compliant on flomax due to frequent urination.   Peripheral arterial disease ([place]) Doppler studies normal 06/2016, seen by Dr. Gwenlyn Found.   Atrial fibrillation with controlled ventricular rate (HCC) EKG today sinus bradycardia with first degree heart block.  This patients CHA2DS2-VASc Score and unadjusted Ischemic Stroke Rate (% per year) is equal to 4.8 % stroke rate/year from a score of 4  (HTN, DM, age, vascular).  Will continue xarelto. He is unaware of his Afib and bradycardia. May consider event monitor vs loop recorder in the future to evaluate Afib burden. Will decrease atenolol to 25 mg given his heart rate in the 40s and first degree heart block.   Diet-controlled diabetes mellitus ([place]) No recent A1c on file. Will defer to PCP   Current smoker Encouraged smoking cessation, especially given Buerger's disease.   Medication Adjustments/Labs and Tests Ordered: Current medicines are reviewed at length with the patient today.  Concerns regarding medicines  are outlined above.  Orders Placed This Encounter  Procedures  . EKG 12-Lead   Meds ordered this encounter  Medications  . atenolol (TENORMIN) 25 MG tablet    Sig: Take 1 tablet (25 mg total) by mouth 2 (two) times daily.    Dispense:  60 tablet    Refill:  3  . DISCONTD: hydrALAZINE (APRESOLINE) 25 MG tablet    Sig: Take 1 tablet (25 mg total) by mouth 3 (three) times daily.    Dispense:  270 tablet    Refill:  3  . hydrALAZINE (APRESOLINE) 25 MG tablet    Sig: Take 1 tablet (25 mg total) by mouth 2 (two) times daily.    Dispense:  60 tablet    Refill:  3    DISCARD PREVIOUS RX MEDICATION IS PRESCRIBED BID    Signed, Ledora Bottcher, PA  05/26/2017 4:05 PM    [facility]

## 2017-05-26 NOTE — Patient Instructions (Signed)
Medication Instructions:  DECREASE Atenolol to 25mg  Take 1 tablet once a day START Hydralazine 25mg  Take 1 tablet twice a day  Labwork: None   Testing/Procedures: None   Follow-Up: Your physician recommends that you schedule a follow-up appointment in: 3 Months with Dr Percival Spanish. Your physician recommends that you schedule a follow-up appointment in: 3-4 weeks with Isaac Laud  Any Other Special Instructions Will Be Listed Below (If Applicable).  Record blood pressure 2 hours after taking breakfast   If you need a refill on your cardiac medications before your next appointment, please call your pharmacy.

## 2017-06-01 ENCOUNTER — Other Ambulatory Visit: Payer: Self-pay | Admitting: Urology

## 2017-06-01 DIAGNOSIS — C61 Malignant neoplasm of prostate: Secondary | ICD-10-CM

## 2017-06-03 ENCOUNTER — Telehealth: Payer: Self-pay | Admitting: *Deleted

## 2017-06-03 NOTE — Telephone Encounter (Signed)
Sent to requesting party [place] fax function in [facility]

## 2017-06-03 NOTE — Telephone Encounter (Signed)
   [facility] Pre-operative Risk Assessment    Request for surgical clearance:  1. What type of surgery is being performed? PROSTATE BIOPSY   2. When is this surgery scheduled? 08-01-17   3. What type of clearance is required (medical clearance vs. Pharmacy clearance to hold med vs. Both)? PHARMACY  4. Are there any medications that need to be held prior to surgery and how long?XARELTO- 5 DAYS PRIOR TO PROCEDURE   5. Practice name and name of physician performing surgery? DR DAHLSTEDT   6. What is your office phone and fax number? PH=[phone]  FAX=[phone]   7. Anesthesia type (None, local, MAC, general) ? UNKNOWN   Steven Boyd 06/03/2017, 3:55 PM  _________________________________________________________________   (provider comments below)

## 2017-06-03 NOTE — Telephone Encounter (Signed)
See Pharm D recommendations below. [place] for surgery.

## 2017-06-03 NOTE — Telephone Encounter (Signed)
Patient with diagnosis of atrial fibrillation on Eliquis for anticoagulation.    Procedure: prostate biopsy Date of procedure: 4/12/819  CHADS2-VASc score of  4 (HTN, AGE, DM2, CAD)  CrCl 79.2 Platelet count 166  Per office protocol, patient can hold Eliquis for 3 days prior to procedure.   Patient will not need bridging with Lovenox (enoxaparin) around procedure.  Patient should restart Eliquis on the evening of procedure or day after, at discretion of procedure MD

## 2017-06-03 NOTE — Telephone Encounter (Signed)
   Primary Cardiologist: Minus Breeding, MD  Chart reviewed as part of pre-operative protocol coverage. Given past medical history and time since last visit, based on ACC/AHA guidelines, Loyd A Nebel would be at acceptable risk for the planned procedure without further cardiovascular testing.   I will route this to pharmacist for recommendations on holding Eliquis prior to prostate biopsy.  I will route this recommendation to the requesting party via Epic fax function and remove from pre-op pool.  Please call with questions.  Jory Sims DNP, ANP, AACC   06/03/2017, 4:06 PM

## 2017-06-18 ENCOUNTER — Ambulatory Visit (INDEPENDENT_AMBULATORY_CARE_PROVIDER_SITE_OTHER): Payer: Medicare Other | Admitting: Physician Assistant

## 2017-06-18 ENCOUNTER — Encounter: Payer: Self-pay | Admitting: Physician Assistant

## 2017-06-18 VITALS — BP 154/80 | HR 68 | Ht 74.0 in | Wt 239.0 lb

## 2017-06-18 DIAGNOSIS — Z720 Tobacco use: Secondary | ICD-10-CM

## 2017-06-18 DIAGNOSIS — I480 Paroxysmal atrial fibrillation: Secondary | ICD-10-CM

## 2017-06-18 DIAGNOSIS — E119 Type 2 diabetes mellitus without complications: Secondary | ICD-10-CM

## 2017-06-18 DIAGNOSIS — I731 Thromboangiitis obliterans [Buerger's disease]: Secondary | ICD-10-CM | POA: Diagnosis not present

## 2017-06-18 DIAGNOSIS — I10 Essential (primary) hypertension: Secondary | ICD-10-CM

## 2017-06-18 MED ORDER — HYDRALAZINE HCL 50 MG PO TABS: 50.0000 mg | ORAL_TABLET | Freq: Two times a day (BID) | ORAL | 3 refills | Status: DC

## 2017-06-18 MED ORDER — LISINOPRIL 20 MG PO TABS: 40.0000 mg | ORAL_TABLET | Freq: Every day | ORAL | 3 refills | Status: DC

## 2017-06-18 NOTE — Patient Instructions (Signed)
Medication Instructions:  DECREASE lisinopril to 40 mg (2 tablets) daily  INCREASE hydralazine to 50 mg two times daily  Follow-Up: As scheduled with Dr. Percival Spanish  Any Other Special Instructions Will Be Listed Below (If Applicable).  Ok to hold xarelto x 3 days prior to biopsy. Restart as soon as possible when ok by surgeon.   If you need a refill on your cardiac medications before your next appointment, please call your pharmacy.

## 2017-06-18 NOTE — Progress Notes (Signed)
Cardiology Office Note    Date:  06/20/2017   ID:  [name], DOB 1940/07/11, MRN [id number]  PCP:  Redmond School, MD  Cardiologist:  Minus Breeding, MD   Chief Complaint  Patient presents with  . Follow-up    follow up, wants to discuss hydralazine    History of Present Illness:  GERRALD Boyd is a 77 y.o. male with PMH of atrial fibrillation on Xarelto, hypertension, diet-controlled diabetes, history of Buerger's syndrome in 1990s and tobacco abuse.  She had a negative stress echo in 2015.  I last saw the patient in January 2018 for newly diagnosed atrial fibrillation.  I started him on Xarelto at the time.  He was last seen by Fabian Sharp on 05/26/2017 for preoperative clearance prior to prostate biopsy by urology.  EKG showed sinus bradycardia with first-degree AV block.  He was cleared from cardiology perspective to proceed with biopsy.  He presents today for follow-up and management of high blood pressure.  His blood pressure remain elevated.  Review of his medication list shows he is actually on lisinopril 40 mg twice daily.  Although the maximum dose of lisinopril is 80 mg within 24 hours, however the effect of lisinopril markedly diminished after 40 mg daily.  I will decrease his lisinopril to 40 mg daily.  I will also increase his hydralazine to 50mg  BID.  Hydralazine was added during the last office visit.  He has not had any significant side effect from this medication.  Otherwise he denies any significant chest discomfort, shortness of breath, orthopnea or paroxysmal nocturnal dyspnea.  I counseled him further on the need to stop smoking especially in light of previous history of Buerger's syndrome.   Past Medical History:  Diagnosis Date  . Diabetes mellitus without complication (HCC)    diet controlled  . GERD (gastroesophageal reflux disease)   . Hard of hearing    bilateral, no hearing aids  . Hypertension   . Prostate cancer [facility])     Past Surgical  History:  Procedure Laterality Date  . BIOPSY  09/23/2016   Procedure: BIOPSY;  Surgeon: Rogene Houston, MD;  [facility];  Service: Endoscopy;;  gastric  . COLONOSCOPY N/A 05/08/2016   Procedure: COLONOSCOPY;  Surgeon: Rogene Houston, MD;  [facility];  Service: Endoscopy;  Laterality: N/A;  2:25-moved to 100 per Ann  . ESOPHAGOGASTRODUODENOSCOPY N/A 06/07/2016   Procedure: ESOPHAGOGASTRODUODENOSCOPY (EGD);  Surgeon: Rogene Houston, MD;  [facility];  Service: Endoscopy;  Laterality: N/A;  915  . ESOPHAGOGASTRODUODENOSCOPY N/A 09/23/2016   Procedure: ESOPHAGOGASTRODUODENOSCOPY (EGD);  Surgeon: Rogene Houston, MD;  [facility];  Service: Endoscopy;  Laterality: N/A;  955  . FEMUR IM NAIL Left 12/24/2014   Procedure: INTRAMEDULLARY (IM) NAIL FEMORAL;  Surgeon: Marchia Bond, MD;  Location: [place];  Service: Orthopedics;  Laterality: Left;  . POLYPECTOMY  05/08/2016   Procedure: POLYPECTOMY;  Surgeon: Rogene Houston, MD;  [facility];  Service: Endoscopy;;  sigmoid colon polypectomies x3     Current Medications: Outpatient Medications Prior to Visit  Medication Sig Dispense Refill  . atenolol (TENORMIN) 25 MG tablet Take 1 tablet (25 mg total) by mouth 2 (two) times daily. 60 tablet 3  . Omega-3 Fatty Acids (FISH OIL) 1200 MG CAPS Take 1,200 mg by mouth daily.     . rivaroxaban (XARELTO) 20 MG TABS tablet Take 1 tablet (20 mg total) by mouth daily  with supper. 28 tablet 0  . tamsulosin (FLOMAX) 0.4 MG CAPS capsule Take 0.4 mg by mouth daily.     . hydrALAZINE (APRESOLINE) 25 MG tablet Take 1 tablet (25 mg total) by mouth 2 (two) times daily. 60 tablet 3  . lisinopril (PRINIVIL,ZESTRIL) 20 MG tablet Take 40 mg by mouth daily.     No facility-administered medications prior to visit.      Allergies:   Protonix [pantoprazole sodium] and Dilaudid [hydromorphone hcl]   Social History   Socioeconomic History  . Marital status: Married      Spouse name: None  . Number of children: None  . Years of education: None  . Highest education level: None  Social Needs  . Financial resource strain: None  . Food insecurity - worry: None  . Food insecurity - inability: None  . Transportation needs - medical: None  . Transportation needs - non-medical: None  Occupational History  . None  Tobacco Use  . Smoking status: Current Every Day Smoker    Packs/day: 1.00    Years: 55.00    Pack years: 55.00    Types: Cigarettes  . Smokeless tobacco: Never Used  Substance and Sexual Activity  . Alcohol use: No  . Drug use: No  . Sexual activity: None  Other Topics Concern  . None  Social History Narrative  . None     Family History:  The patient's family history includes Diabetes in his mother; Heart Problems in his father.   ROS:   Please see the history of present illness.    ROS All other systems reviewed and are negative.   PHYSICAL EXAM:   VS:  BP (!) 154/80   Pulse 68   Ht 6\' 2"  (1.88 m)   Wt 239 lb (108.4 kg)   BMI 30.69 kg/m    GEN: Well nourished, well developed, in no acute distress  HEENT: normal  Neck: no JVD, carotid bruits, or masses Cardiac: RRR; no murmurs, rubs, or gallops,no edema  Respiratory:  clear to auscultation bilaterally, normal work of breathing GI: soft, nontender, nondistended, + BS MS: no deformity or atrophy  Skin: warm and dry, no rash Neuro:  Alert and Oriented x 3, Strength and sensation are intact Psych: euthymic mood, full affect  Wt Readings from Last 3 Encounters:  06/18/17 239 lb (108.4 kg)  05/26/17 240 lb (108.9 kg)  09/23/16 225 lb (102.1 kg)      Studies/Labs Reviewed:   EKG:  EKG is not ordered today.    Recent Labs: 06/24/2016: ALT 24; BUN 14; Creatinine, Ser 1.04; Hemoglobin 16.1; Platelets 166; Potassium 4.1; Sodium 139   Lipid Panel    Component Value Date/Time   CHOL 161 05/13/2016 0837   TRIG 82 05/13/2016 0837   HDL 42 05/13/2016 0837   CHOLHDL 3.8  05/13/2016 0837   VLDL 16 05/13/2016 0837   LDLCALC 103 (H) 05/13/2016 0837    Additional studies/ records that were reviewed today include:   Stress echo 03/14/2014    ASSESSMENT:    1. Essential hypertension   2. PAF (paroxysmal atrial fibrillation) ([place])   3. Diet-controlled type 2 diabetes mellitus ([place])   4. Buerger's disease ([place])   5. Tobacco abuse      PLAN:  In order of problems listed above:  1. Hypertension: Blood pressure remain elevated, review of his medication list shows he is on 40 mg twice daily of lisinopril, I have decreased this to 40 mg daily as dosage  above 40 mg daily for lisinopril is unlikely to offer significant BP reduction.  I also increased his lisinopril to 50 mg twice daily.  2. PAF: On Xarelto and atenolol. CHA2DS2-Vasc score 3 (age, DM, HTN)  3. Diet-controlled type 2 diabetes: Monitored by primary care provider  4. Buerger's disease: No recent recurrence.  However he need to stop smoking  5. Tobacco abuse: We had a long discussion between tobacco use and heart disease.  He understands the need to stop smoking.    Medication Adjustments/Labs and Tests Ordered: Current medicines are reviewed at length with the patient today.  Concerns regarding medicines are outlined above.  Medication changes, Labs and Tests ordered today are listed in the Patient Instructions below. Patient Instructions  Medication Instructions:  DECREASE lisinopril to 40 mg (2 tablets) daily  INCREASE hydralazine to 50 mg two times daily  Follow-Up: As scheduled with Dr. Percival Spanish  Any Other Special Instructions Will Be Listed Below (If Applicable).  Ok to hold xarelto x 3 days prior to biopsy. Restart as soon as possible when ok by surgeon.   If you need a refill on your cardiac medications before your next appointment, please call your pharmacy.      Hilbert Corrigan, [place]  06/20/2017 2:05 PM    [facility] [address],  [place], [place]  [address] Phone: [phone]; Fax: [phone]

## 2017-06-20 ENCOUNTER — Encounter: Payer: Self-pay | Admitting: Physician Assistant

## 2017-06-23 ENCOUNTER — Ambulatory Visit
Admission: RE | Admit: 2017-06-23 | Discharge: 2017-06-23 | Disposition: A | Discharge status: Home / Self Care | Payer: Medicare Other | Source: Ambulatory Visit | Attending: Urology | Admitting: Urology

## 2017-06-23 DIAGNOSIS — C61 Malignant neoplasm of prostate: Secondary | ICD-10-CM

## 2017-06-23 MED ORDER — GADOBENATE DIMEGLUMINE 529 MG/ML IV SOLN
20.0000 mL | Freq: Once | INTRAVENOUS | Status: AC | PRN
  Administered 2017-06-23: 20 mL via INTRAVENOUS

## 2017-07-10 ENCOUNTER — Other Ambulatory Visit: Payer: Self-pay | Admitting: Urology

## 2017-07-10 DIAGNOSIS — C61 Malignant neoplasm of prostate: Secondary | ICD-10-CM

## 2017-07-22 ENCOUNTER — Ambulatory Visit (HOSPITAL_COMMUNITY)
Admission: RE | Admit: 2017-07-22 | Discharge: 2017-07-22 | Disposition: A | Discharge status: Home / Self Care | Payer: Medicare Other | Source: Ambulatory Visit | Attending: Urology | Admitting: Urology

## 2017-07-22 DIAGNOSIS — C61 Malignant neoplasm of prostate: Secondary | ICD-10-CM | POA: Diagnosis not present

## 2017-07-22 MED ORDER — LIDOCAINE HCL (PF) 2 % IJ SOLN
INTRAMUSCULAR | Status: AC
  Administered 2017-07-22: 10 mL
  Filled 2017-07-22: qty 10

## 2017-07-22 MED ORDER — GENTAMICIN SULFATE 40 MG/ML IJ SOLN
INTRAMUSCULAR | Status: AC
  Administered 2017-07-22: 160 mg via INTRAMUSCULAR
  Filled 2017-07-22: qty 4

## 2017-07-22 MED ORDER — GENTAMICIN SULFATE 40 MG/ML IJ SOLN
160.0000 mg | Freq: Once | INTRAMUSCULAR | Status: AC
  Administered 2017-07-22: 160 mg via INTRAMUSCULAR

## 2017-07-22 MED ORDER — LIDOCAINE HCL (PF) 2 % IJ SOLN
10.0000 mL | Freq: Once | INTRAMUSCULAR | Status: AC
  Administered 2017-07-22: 10 mL

## 2017-08-26 ENCOUNTER — Ambulatory Visit: Payer: Medicare Other | Admitting: Cardiology

## 2017-08-27 DIAGNOSIS — C61 Malignant neoplasm of prostate: Secondary | ICD-10-CM | POA: Diagnosis not present

## 2017-08-27 DIAGNOSIS — E039 Hypothyroidism, unspecified: Secondary | ICD-10-CM | POA: Diagnosis not present

## 2017-08-27 DIAGNOSIS — E6609 Other obesity due to excess calories: Secondary | ICD-10-CM | POA: Diagnosis not present

## 2017-08-27 DIAGNOSIS — Z1389 Encounter for screening for other disorder: Secondary | ICD-10-CM | POA: Diagnosis not present

## 2017-08-27 DIAGNOSIS — Z6830 Body mass index (BMI) 30.0-30.9, adult: Secondary | ICD-10-CM | POA: Diagnosis not present

## 2017-08-27 DIAGNOSIS — E1142 Type 2 diabetes mellitus with diabetic polyneuropathy: Secondary | ICD-10-CM | POA: Diagnosis not present

## 2017-08-27 DIAGNOSIS — I10 Essential (primary) hypertension: Secondary | ICD-10-CM | POA: Diagnosis not present

## 2017-09-23 ENCOUNTER — Ambulatory Visit (INDEPENDENT_AMBULATORY_CARE_PROVIDER_SITE_OTHER): Payer: 59 | Admitting: Internal Medicine

## 2017-10-14 ENCOUNTER — Other Ambulatory Visit: Payer: Self-pay | Admitting: Physician Assistant

## 2017-11-17 DIAGNOSIS — Z6830 Body mass index (BMI) 30.0-30.9, adult: Secondary | ICD-10-CM | POA: Diagnosis not present

## 2017-11-17 DIAGNOSIS — Z1389 Encounter for screening for other disorder: Secondary | ICD-10-CM | POA: Diagnosis not present

## 2017-11-17 DIAGNOSIS — Z0001 Encounter for general adult medical examination with abnormal findings: Secondary | ICD-10-CM | POA: Diagnosis not present

## 2017-11-18 DIAGNOSIS — M1991 Primary osteoarthritis, unspecified site: Secondary | ICD-10-CM | POA: Diagnosis not present

## 2017-11-18 DIAGNOSIS — E1140 Type 2 diabetes mellitus with diabetic neuropathy, unspecified: Secondary | ICD-10-CM | POA: Diagnosis not present

## 2017-11-18 DIAGNOSIS — I4891 Unspecified atrial fibrillation: Secondary | ICD-10-CM | POA: Diagnosis not present

## 2017-11-18 DIAGNOSIS — R201 Hypoesthesia of skin: Secondary | ICD-10-CM | POA: Diagnosis not present

## 2017-11-18 DIAGNOSIS — F0390 Unspecified dementia without behavioral disturbance: Secondary | ICD-10-CM | POA: Diagnosis not present

## 2017-11-18 DIAGNOSIS — E6609 Other obesity due to excess calories: Secondary | ICD-10-CM | POA: Diagnosis not present

## 2017-11-18 DIAGNOSIS — K219 Gastro-esophageal reflux disease without esophagitis: Secondary | ICD-10-CM | POA: Diagnosis not present

## 2017-11-18 DIAGNOSIS — I10 Essential (primary) hypertension: Secondary | ICD-10-CM | POA: Diagnosis not present

## 2017-11-18 DIAGNOSIS — Z6830 Body mass index (BMI) 30.0-30.9, adult: Secondary | ICD-10-CM | POA: Diagnosis not present

## 2018-01-01 ENCOUNTER — Ambulatory Visit (INDEPENDENT_AMBULATORY_CARE_PROVIDER_SITE_OTHER): Payer: Medicare Other | Admitting: Otolaryngology

## 2018-01-01 DIAGNOSIS — H9012 Conductive hearing loss, unilateral, left ear, with unrestricted hearing on the contralateral side: Secondary | ICD-10-CM | POA: Diagnosis not present

## 2018-01-01 DIAGNOSIS — H6122 Impacted cerumen, left ear: Secondary | ICD-10-CM

## 2018-01-16 DIAGNOSIS — C61 Malignant neoplasm of prostate: Secondary | ICD-10-CM | POA: Diagnosis not present

## 2018-01-27 ENCOUNTER — Ambulatory Visit (INDEPENDENT_AMBULATORY_CARE_PROVIDER_SITE_OTHER): Payer: Medicare Other | Admitting: Urology

## 2018-01-27 DIAGNOSIS — C61 Malignant neoplasm of prostate: Secondary | ICD-10-CM

## 2018-01-27 DIAGNOSIS — N401 Enlarged prostate with lower urinary tract symptoms: Secondary | ICD-10-CM

## 2018-02-10 ENCOUNTER — Other Ambulatory Visit: Payer: Self-pay | Admitting: Physician Assistant

## 2018-03-01 ENCOUNTER — Other Ambulatory Visit: Payer: Self-pay | Admitting: Physician Assistant

## 2018-03-02 NOTE — Telephone Encounter (Signed)
Rx has been sent to the pharmacy electronically. ° °

## 2018-05-24 ENCOUNTER — Other Ambulatory Visit: Payer: Self-pay

## 2018-05-24 ENCOUNTER — Emergency Department (HOSPITAL_COMMUNITY)
Admission: EM | Admit: 2018-05-24 | Discharge: 2018-05-24 | Disposition: A | Discharge status: Home / Self Care | Payer: PPO | Attending: Emergency Medicine | Admitting: Emergency Medicine

## 2018-05-24 ENCOUNTER — Encounter (HOSPITAL_COMMUNITY): Payer: Self-pay | Admitting: Emergency Medicine

## 2018-05-24 DIAGNOSIS — Z79899 Other long term (current) drug therapy: Secondary | ICD-10-CM | POA: Diagnosis not present

## 2018-05-24 DIAGNOSIS — F17210 Nicotine dependence, cigarettes, uncomplicated: Secondary | ICD-10-CM | POA: Insufficient documentation

## 2018-05-24 DIAGNOSIS — E119 Type 2 diabetes mellitus without complications: Secondary | ICD-10-CM | POA: Diagnosis not present

## 2018-05-24 DIAGNOSIS — I83892 Varicose veins of left lower extremities with other complications: Secondary | ICD-10-CM | POA: Insufficient documentation

## 2018-05-24 DIAGNOSIS — Z7901 Long term (current) use of anticoagulants: Secondary | ICD-10-CM | POA: Insufficient documentation

## 2018-05-24 DIAGNOSIS — I10 Essential (primary) hypertension: Secondary | ICD-10-CM | POA: Diagnosis not present

## 2018-05-24 DIAGNOSIS — I83899 Varicose veins of unspecified lower extremities with other complications: Secondary | ICD-10-CM

## 2018-05-24 DIAGNOSIS — R58 Hemorrhage, not elsewhere classified: Secondary | ICD-10-CM | POA: Diagnosis not present

## 2018-05-24 NOTE — Discharge Instructions (Signed)
These keep your leg wrapped in the dressing for 24 hours, remove it under water to make sure that it does not tear off any scab.  Regardless of whether it is bleeding please replace the dressing with the materials that we have given you.  If the bleeding continues or is worsening or soaking through the dressings or will not stop please return to the emergency department for a recheck, otherwise see your doctor in 3 days for recheck.

## 2018-05-24 NOTE — ED Notes (Signed)
Pts HR 48-52.  Placed on cardiac monitor.  Pt asymptomatic.  Family at bedside. Monitor shows SB 48.  Pt very talkative and denies any pain at this time.

## 2018-05-24 NOTE — ED Triage Notes (Signed)
PT states he was in the shower this am and was washing his legs and started bleeding from a vein to left inner ankle. EMS applied pressure dressing and pain controlled at this time.

## 2018-05-24 NOTE — ED Provider Notes (Signed)
[facility] Provider Note   CSN: [id number] Arrival date & time: 05/24/18  1032     History   Chief Complaint Chief Complaint  Patient presents with  . Varicose Veins    HPI Steven Boyd is a 78 y.o. male.  HPI  78 year old male, known history of diabetes as well as a history of hypertension and some peripheral edema for which she is supposed to wear compression stockings.  He presents after having acute onset of bleeding of his left medial ankle which occurred while he was in the shower when he rubbed a washcloth over his leg.  There was a significant amount of blood lost, family was called into the room and helped apply pressure.  He arrives with a dressing in place.  He denies any other symptoms at this time other than having sensitive legs due to some cracking of the skin.  He does not wear the compression stockings as has been advised by his family doctor and has not worn them in a long time.  Symptoms were persistent, mild to moderate, improved after compression applied.  Past Medical History:  Diagnosis Date  . Diabetes mellitus without complication (HCC)    diet controlled  . GERD (gastroesophageal reflux disease)   . Hard of hearing    bilateral, no hearing aids  . Hypertension   . Prostate cancer [facility])     Patient Active Problem List   Diagnosis Date Noted  . Atrial fibrillation with controlled ventricular rate ([place]) 05/26/2017  . Diet-controlled diabetes mellitus ([place]) 05/26/2017  . Buerger disease ([place]) 05/26/2017  . Tobacco use 05/26/2017  . History of Helicobacter infection 08/15/2016  . Chronic gastric ulcer 08/15/2016  . Abdominal pain, epigastric 05/29/2016  . Peripheral arterial disease ([place]) 05/29/2016  . Encounter for screening colonoscopy 03/18/2016  . Intertrochanteric fracture of left hip ([place]) 12/24/2014  . HTN (hypertension) 12/24/2014    Past Surgical History:  Procedure Laterality Date  . BIOPSY  09/23/2016   Procedure: BIOPSY;  Surgeon: Rogene Houston, MD;  [facility];  Service: Endoscopy;;  gastric  . COLONOSCOPY N/A 05/08/2016   Procedure: COLONOSCOPY;  Surgeon: Rogene Houston, MD;  [facility];  Service: Endoscopy;  Laterality: N/A;  2:25-moved to 100 per Ann  . ESOPHAGOGASTRODUODENOSCOPY N/A 06/07/2016   Procedure: ESOPHAGOGASTRODUODENOSCOPY (EGD);  Surgeon: Rogene Houston, MD;  [facility];  Service: Endoscopy;  Laterality: N/A;  915  . ESOPHAGOGASTRODUODENOSCOPY N/A 09/23/2016   Procedure: ESOPHAGOGASTRODUODENOSCOPY (EGD);  Surgeon: Rogene Houston, MD;  [facility];  Service: Endoscopy;  Laterality: N/A;  955  . FEMUR IM NAIL Left 12/24/2014   Procedure: INTRAMEDULLARY (IM) NAIL FEMORAL;  Surgeon: Marchia Bond, MD;  Location: [facility];  Service: Orthopedics;  Laterality: Left;  . POLYPECTOMY  05/08/2016   Procedure: POLYPECTOMY;  Surgeon: Rogene Houston, MD;  [facility];  Service: Endoscopy;;  sigmoid colon polypectomies x3         Home Medications    Prior to Admission medications   Medication Sig Start Date End Date Taking? Authorizing Provider  atenolol (TENORMIN) 25 MG tablet TAKE 1 TABLET BY MOUTH TWICE DAILY 03/02/18   [facility], Tami Lin, PA  hydrALAZINE (APRESOLINE) 50 MG tablet TAKE 1 TABLET BY MOUTH TWICE DAILY 02/11/18   Almyra Deforest, PA  lisinopril (PRINIVIL,ZESTRIL) 20 MG tablet Take 2 tablets (40 mg total) by mouth daily. 06/18/17   Almyra Deforest, PA  Omega-3 Fatty Acids (  FISH OIL) 1200 MG CAPS Take 1,200 mg by mouth daily.     [provider]  rivaroxaban (XARELTO) 20 MG TABS tablet Take 1 tablet (20 mg total) by mouth daily with supper. 09/24/16   Rogene Houston, MD  tamsulosin (FLOMAX) 0.4 MG CAPS capsule Take 0.4 mg by mouth daily.     [provider]    Family History Family History  Problem Relation Age of Onset  . Diabetes Mother   . Heart Problems Father        s/p heart valve repair 10  years before he passed away    Social History Social History   Tobacco Use  . Smoking status: Current Every Day Smoker    Packs/day: 1.00    Years: 55.00    Pack years: 55.00    Types: Cigarettes  . Smokeless tobacco: Never Used  Substance Use Topics  . Alcohol use: No  . Drug use: No     Allergies   Protonix [pantoprazole sodium] and Dilaudid [hydromorphone hcl]   Review of Systems Review of Systems  Constitutional: Negative for fever.  Respiratory: Negative for cough and shortness of breath.   Cardiovascular: Positive for leg swelling.  Skin: Positive for wound.     Physical Exam Updated Vital Signs BP 136/77   Pulse (!) 47   Temp 98.5 F (36.9 C) (Oral)   Resp 15   Ht 1.88 m (6\' 2" )   Wt 108.9 kg   SpO2 97%   BMI 30.81 kg/m   Physical Exam Vitals signs and nursing note reviewed.  Constitutional:      General: He is not in acute distress.    Appearance: He is well-developed.  HENT:     Head: Normocephalic and atraumatic.     Mouth/Throat:     Pharynx: No oropharyngeal exudate.  Eyes:     General: No scleral icterus.       Right eye: No discharge.        Left eye: No discharge.     Conjunctiva/sclera: Conjunctivae normal.     Pupils: Pupils are equal, round, and reactive to light.  Neck:     Musculoskeletal: Normal range of motion and neck supple.     Thyroid: No thyromegaly.     Vascular: No JVD.  Cardiovascular:     Rate and Rhythm: Normal rate. Rhythm irregular.     Heart sounds: Normal heart sounds. No murmur. No friction rub. No gallop.   Pulmonary:     Effort: Pulmonary effort is normal. No respiratory distress.     Breath sounds: Normal breath sounds. No wheezing or rales.  Abdominal:     General: Bowel sounds are normal. There is no distension.     Palpations: Abdomen is soft. There is no mass.     Tenderness: There is no abdominal tenderness.  Musculoskeletal: Normal range of motion.        General: No tenderness.     Right lower  leg: Edema present.     Left lower leg: Edema present.  Lymphadenopathy:     Cervical: No cervical adenopathy.  Skin:    General: Skin is warm and dry.     Findings: Rash present. No erythema.     Comments: Small 1 mm abrasion to the left medial ankle with a small amount of venous ooze.  No other bleeding, normal pulses at the bilateral dorsalis pedis  Neurological:     Mental Status: He is alert.  Coordination: Coordination normal.  Psychiatric:        Behavior: Behavior normal.      ED Treatments / Results  Labs (all labs ordered are listed, but only abnormal results are displayed) Labs Reviewed - No data to display  EKG None  Radiology No results found.  Procedures Procedures (including critical care time)  Medications Ordered in ED Medications - No data to display   Initial Impression / Assessment and Plan / ED Course  I have reviewed the triage vital signs and the nursing notes.  Pertinent labs & imaging results that were available during my care of the patient were reviewed by me and considered in my medical decision making (see chart for details).     This patient is well-appearing, he does not appear to have lost that much blood clinically, quick clot gauze was applied to the wound after it was inspected, a dressing was placed, he will need a short observation in the ED.  Suspect he will be able to follow-up in the outpatient setting.  He is on an anticoagulant due to atrial fibrillation.  No bleeding on recheck, patient stable for discharge  Final Clinical Impressions(s) / ED Diagnoses   Final diagnoses:  Bleeding from varicose vein    ED Discharge Orders    None       Noemi Chapel, MD 05/24/18 1237

## 2018-05-26 DIAGNOSIS — E669 Obesity, unspecified: Secondary | ICD-10-CM | POA: Diagnosis not present

## 2018-05-26 DIAGNOSIS — I731 Thromboangiitis obliterans [Buerger's disease]: Secondary | ICD-10-CM | POA: Diagnosis not present

## 2018-05-26 DIAGNOSIS — Z6830 Body mass index (BMI) 30.0-30.9, adult: Secondary | ICD-10-CM | POA: Diagnosis not present

## 2018-05-26 DIAGNOSIS — I10 Essential (primary) hypertension: Secondary | ICD-10-CM | POA: Diagnosis not present

## 2018-05-26 DIAGNOSIS — F17290 Nicotine dependence, other tobacco product, uncomplicated: Secondary | ICD-10-CM | POA: Diagnosis not present

## 2018-05-26 DIAGNOSIS — E1142 Type 2 diabetes mellitus with diabetic polyneuropathy: Secondary | ICD-10-CM | POA: Diagnosis not present

## 2018-05-26 DIAGNOSIS — Z1389 Encounter for screening for other disorder: Secondary | ICD-10-CM | POA: Diagnosis not present

## 2018-05-26 DIAGNOSIS — C61 Malignant neoplasm of prostate: Secondary | ICD-10-CM | POA: Diagnosis not present

## 2018-05-26 DIAGNOSIS — Z719 Counseling, unspecified: Secondary | ICD-10-CM | POA: Diagnosis not present

## 2018-05-26 DIAGNOSIS — B351 Tinea unguium: Secondary | ICD-10-CM | POA: Diagnosis not present

## 2018-05-26 DIAGNOSIS — R201 Hypoesthesia of skin: Secondary | ICD-10-CM | POA: Diagnosis not present

## 2018-05-26 DIAGNOSIS — Z23 Encounter for immunization: Secondary | ICD-10-CM | POA: Diagnosis not present

## 2018-06-01 ENCOUNTER — Emergency Department (HOSPITAL_COMMUNITY)
Admission: EM | Admit: 2018-06-01 | Discharge: 2018-06-01 | Disposition: A | Discharge status: Home / Self Care | Payer: PPO | Attending: Emergency Medicine | Admitting: Emergency Medicine

## 2018-06-01 ENCOUNTER — Emergency Department (HOSPITAL_COMMUNITY): Payer: PPO

## 2018-06-01 ENCOUNTER — Encounter (HOSPITAL_COMMUNITY): Payer: Self-pay

## 2018-06-01 DIAGNOSIS — R0902 Hypoxemia: Secondary | ICD-10-CM | POA: Diagnosis not present

## 2018-06-01 DIAGNOSIS — Z8546 Personal history of malignant neoplasm of prostate: Secondary | ICD-10-CM | POA: Insufficient documentation

## 2018-06-01 DIAGNOSIS — R531 Weakness: Secondary | ICD-10-CM | POA: Diagnosis not present

## 2018-06-01 DIAGNOSIS — F17210 Nicotine dependence, cigarettes, uncomplicated: Secondary | ICD-10-CM | POA: Diagnosis not present

## 2018-06-01 DIAGNOSIS — R55 Syncope and collapse: Secondary | ICD-10-CM | POA: Diagnosis not present

## 2018-06-01 DIAGNOSIS — R0602 Shortness of breath: Secondary | ICD-10-CM | POA: Insufficient documentation

## 2018-06-01 DIAGNOSIS — R001 Bradycardia, unspecified: Secondary | ICD-10-CM | POA: Insufficient documentation

## 2018-06-01 DIAGNOSIS — Z79899 Other long term (current) drug therapy: Secondary | ICD-10-CM | POA: Diagnosis not present

## 2018-06-01 DIAGNOSIS — I10 Essential (primary) hypertension: Secondary | ICD-10-CM | POA: Diagnosis not present

## 2018-06-01 DIAGNOSIS — H538 Other visual disturbances: Secondary | ICD-10-CM | POA: Diagnosis not present

## 2018-06-01 DIAGNOSIS — R079 Chest pain, unspecified: Secondary | ICD-10-CM | POA: Diagnosis not present

## 2018-06-01 DIAGNOSIS — Z7901 Long term (current) use of anticoagulants: Secondary | ICD-10-CM | POA: Insufficient documentation

## 2018-06-01 DIAGNOSIS — E119 Type 2 diabetes mellitus without complications: Secondary | ICD-10-CM | POA: Insufficient documentation

## 2018-06-01 DIAGNOSIS — R142 Eructation: Secondary | ICD-10-CM | POA: Insufficient documentation

## 2018-06-01 DIAGNOSIS — I447 Left bundle-branch block, unspecified: Secondary | ICD-10-CM | POA: Diagnosis not present

## 2018-06-01 LAB — CBC WITH DIFFERENTIAL/PLATELET
ABS IMMATURE GRANULOCYTES: 0.04 10*3/uL (ref 0.00–0.07)
BASOS ABS: 0 10*3/uL (ref 0.0–0.1)
Basophils Relative: 0 %
EOS PCT: 1 %
Eosinophils Absolute: 0.1 10*3/uL (ref 0.0–0.5)
HEMATOCRIT: 49 % (ref 39.0–52.0)
Hemoglobin: 15.5 g/dL (ref 13.0–17.0)
IMMATURE GRANULOCYTES: 0 %
LYMPHS ABS: 1.1 10*3/uL (ref 0.7–4.0)
LYMPHS PCT: 11 %
MCH: 29.7 pg (ref 26.0–34.0)
MCHC: 31.6 g/dL (ref 30.0–36.0)
MCV: 93.9 fL (ref 80.0–100.0)
Monocytes Absolute: 0.6 10*3/uL (ref 0.1–1.0)
Monocytes Relative: 6 %
NEUTROS ABS: 8.1 10*3/uL — AB (ref 1.7–7.7)
NEUTROS PCT: 82 %
NRBC: 0 % (ref 0.0–0.2)
Platelets: 140 10*3/uL — ABNORMAL LOW (ref 150–400)
RBC: 5.22 MIL/uL (ref 4.22–5.81)
RDW: 13.2 % (ref 11.5–15.5)
WBC: 10 10*3/uL (ref 4.0–10.5)

## 2018-06-01 LAB — COMPREHENSIVE METABOLIC PANEL
ALT: 28 U/L (ref 0–44)
AST: 25 U/L (ref 15–41)
Albumin: 4.3 g/dL (ref 3.5–5.0)
Alkaline Phosphatase: 87 U/L (ref 38–126)
Anion gap: 10 (ref 5–15)
BILIRUBIN TOTAL: 0.7 mg/dL (ref 0.3–1.2)
BUN: 19 mg/dL (ref 8–23)
CHLORIDE: 107 mmol/L (ref 98–111)
CO2: 22 mmol/L (ref 22–32)
CREATININE: 1.28 mg/dL — AB (ref 0.61–1.24)
Calcium: 8.7 mg/dL — ABNORMAL LOW (ref 8.9–10.3)
GFR, EST NON AFRICAN AMERICAN: 54 mL/min — AB (ref >60)
Glucose, Bld: 110 mg/dL — ABNORMAL HIGH (ref 70–99)
POTASSIUM: 3.6 mmol/L (ref 3.5–5.1)
Sodium: 139 mmol/L (ref 135–145)
TOTAL PROTEIN: 7.4 g/dL (ref 6.5–8.1)

## 2018-06-01 LAB — D-DIMER, QUANTITATIVE (NOT AT ARMC): D DIMER QUANT: 0.4 ug{FEU}/mL (ref 0.00–0.50)

## 2018-06-01 LAB — TROPONIN I: Troponin I: <0.03 ng/mL (ref <0.03)

## 2018-06-01 MED ORDER — ALUM & MAG HYDROXIDE-SIMETH 200-200-20 MG/5ML PO SUSP
30.0000 mL | Freq: Once | ORAL | Status: AC
  Administered 2018-06-01: 30 mL via ORAL
  Filled 2018-06-01: qty 30

## 2018-06-01 MED ORDER — SODIUM CHLORIDE 0.9 % IV BOLUS
500.0000 mL | Freq: Once | INTRAVENOUS | Status: AC
  Administered 2018-06-01: 500 mL via INTRAVENOUS

## 2018-06-01 MED ORDER — LIDOCAINE VISCOUS HCL 2 % MT SOLN
15.0000 mL | Freq: Once | OROMUCOSAL | Status: AC
  Administered 2018-06-01: 15 mL via ORAL
  Filled 2018-06-01: qty 15

## 2018-06-01 NOTE — ED Notes (Signed)
Pt c/o burning in chest and belching.  Notified Dr. Roderic Palau.  MEds given.

## 2018-06-01 NOTE — ED Provider Notes (Signed)
  Provider Note MRN:  [id number]  Arrival date & time: 06/01/18    ED Course and Medical Decision Making  I was informed by nursing that this patient was planned to be admitted to the hospital service for near syncopal event.  It is my understanding that the decision to admit was largely left to the patient, who initially desired admission, but now wishes to go home.  Upon review of patient's results and documentation, with the exception of bradycardia, which has been constant during this ED visit and asymptomatic, patient's work-up is unrevealing.  Troponin negative x2.  EKG with unchanged left bundle branch block.  Near syncope triggered by hot shower, prodromal symptoms, which are both reassuring parts of the history.  I agree with the prior provider, Dr. Roderic Palau, that both admission or discharge with close follow-up would be appropriate.  Given that patient has changed his mind and wishes to leave, I believe it is safe to do so.  As discussed, patient will follow-up with cardiology as soon as possible, will change his atenolol to every other day, will return if symptoms return or worsen.  After the discussed management above, the patient was determined to be safe for discharge.  The patient was in agreement with this plan and all questions regarding their care were answered.  ED return precautions were discussed and the patient will return to the ED with any significant worsening of condition.  Barth Kirks. Sedonia Small, [place] [email or web address]     Maudie Flakes, MD 06/01/18 [phone]

## 2018-06-01 NOTE — ED Triage Notes (Signed)
EMS reports pt got out of the shower at approx 1530 and had blurry, "gray" vision.  Also diaphoretic and near syncope.  Reports checked his bp and told ems it was in 64'P systolic.  EMS reports bp 128/71 sitting and 112/66 standing.  CBG wnl.  Left BBB on ekg per ems.  EMS says room air o2 sat was 88% and had a bloating sensation in chest.   Reports pt recently either was put on a diaphoretic or it was increased and he has been voiding several times today.   EMS gave 240ml bolus.  Pt has 18g r ac.  Pt still reports "bloating" feeling in chest.  Pt also reports intermittent headache.

## 2018-06-01 NOTE — ED Notes (Signed)
Dr Sedonia Small at bedside, pt requesting to go home,

## 2018-06-01 NOTE — Discharge Instructions (Addendum)
Take half your dose of atenolol or 12.5 mg daily.  If you cannot cut your pill in half then you can take 25 mg every other day.  Follow-up with your heart doctor within a week.  Return if any problems

## 2018-06-01 NOTE — ED Provider Notes (Signed)
[facility] EMERGENCY DEPARTMENT Provider Note   CSN: [id number] Arrival date & time: 06/01/18  1721     History   Chief Complaint Chief Complaint  Patient presents with  . Near Syncope    HPI Steven Boyd is a 78 y.o. male.  Patient states he came out of the shower and became very dizzy and short of breath and weak.  Patient feels better now  The history is provided by the patient. No language interpreter was used.  Near Syncope  This is a new problem. The current episode started less than 1 hour ago. The problem occurs rarely. The problem has been resolved. Pertinent negatives include no chest pain, no abdominal pain and no headaches. Nothing aggravates the symptoms. Nothing relieves the symptoms. He has tried nothing for the symptoms. The treatment provided no relief.    Past Medical History:  Diagnosis Date  . Diabetes mellitus without complication (HCC)    diet controlled  . GERD (gastroesophageal reflux disease)   . Hard of hearing    bilateral, no hearing aids  . Hypertension   . Prostate cancer [facility])     Patient Active Problem List   Diagnosis Date Noted  . Atrial fibrillation with controlled ventricular rate ([place]) 05/26/2017  . Diet-controlled diabetes mellitus ([place]) 05/26/2017  . Buerger disease ([place]) 05/26/2017  . Tobacco use 05/26/2017  . History of Helicobacter infection 08/15/2016  . Chronic gastric ulcer 08/15/2016  . Abdominal pain, epigastric 05/29/2016  . Peripheral arterial disease ([place]) 05/29/2016  . Encounter for screening colonoscopy 03/18/2016  . Intertrochanteric fracture of left hip ([place]) 12/24/2014  . HTN (hypertension) 12/24/2014    Past Surgical History:  Procedure Laterality Date  . BIOPSY  09/23/2016   Procedure: BIOPSY;  Surgeon: Rogene Houston, MD;  [facility];  Service: Endoscopy;;  gastric  . COLONOSCOPY N/A 05/08/2016   Procedure: COLONOSCOPY;  Surgeon: Rogene Houston, MD;  [facility];  Service:  Endoscopy;  Laterality: N/A;  2:25-moved to 100 per Ann  . ESOPHAGOGASTRODUODENOSCOPY N/A 06/07/2016   Procedure: ESOPHAGOGASTRODUODENOSCOPY (EGD);  Surgeon: Rogene Houston, MD;  [facility];  Service: Endoscopy;  Laterality: N/A;  915  . ESOPHAGOGASTRODUODENOSCOPY N/A 09/23/2016   Procedure: ESOPHAGOGASTRODUODENOSCOPY (EGD);  Surgeon: Rogene Houston, MD;  [facility];  Service: Endoscopy;  Laterality: N/A;  955  . FEMUR IM NAIL Left 12/24/2014   Procedure: INTRAMEDULLARY (IM) NAIL FEMORAL;  Surgeon: Marchia Bond, MD;  Location: [facility];  Service: Orthopedics;  Laterality: Left;  . POLYPECTOMY  05/08/2016   Procedure: POLYPECTOMY;  Surgeon: Rogene Houston, MD;  [facility];  Service: Endoscopy;;  sigmoid colon polypectomies x3         Home Medications    Prior to Admission medications   Medication Sig Start Date End Date Taking? Authorizing Provider  atenolol (TENORMIN) 25 MG tablet TAKE 1 TABLET BY MOUTH TWICE DAILY 03/02/18   [facility], Tami Lin, PA  hydrALAZINE (APRESOLINE) 50 MG tablet TAKE 1 TABLET BY MOUTH TWICE DAILY 02/11/18   Almyra Deforest, PA  lisinopril (PRINIVIL,ZESTRIL) 20 MG tablet Take 2 tablets (40 mg total) by mouth daily. 06/18/17   Almyra Deforest, PA  Omega-3 Fatty Acids (FISH OIL) 1200 MG CAPS Take 1,200 mg by mouth daily.     [provider]  rivaroxaban (XARELTO) 20 MG TABS tablet Take 1 tablet (20 mg total) by mouth daily with supper. 09/24/16   Rogene Houston, MD  tamsulosin (FLOMAX) 0.4 MG CAPS capsule Take 0.4 mg by mouth daily.     [provider]    Family History Family History  Problem Relation Age of Onset  . Diabetes Mother   . Heart Problems Father        s/p heart valve repair 10 years before he passed away    Social History Social History   Tobacco Use  . Smoking status: Current Every Day Smoker    Packs/day: 1.00    Years: 55.00    Pack years: 55.00    Types: Cigarettes  . Smokeless tobacco: Never  Used  Substance Use Topics  . Alcohol use: No  . Drug use: No     Allergies   Protonix [pantoprazole sodium] and Dilaudid [hydromorphone hcl]   Review of Systems Review of Systems  Constitutional: Negative for appetite change and fatigue.  HENT: Negative for congestion, ear discharge and sinus pressure.   Eyes: Negative for discharge.  Respiratory: Negative for cough.   Cardiovascular: Positive for near-syncope. Negative for chest pain.  Gastrointestinal: Negative for abdominal pain and diarrhea.  Genitourinary: Negative for frequency and hematuria.  Musculoskeletal: Negative for back pain.  Skin: Negative for rash.  Neurological: Positive for dizziness. Negative for seizures and headaches.  Psychiatric/Behavioral: Negative for hallucinations.     Physical Exam Updated Vital Signs BP 137/75   Pulse (!) 48   Temp 97.8 F (36.6 C) (Oral)   Resp 19   Ht 6\' 2"  (1.88 m)   Wt 108.9 kg   SpO2 94%   BMI 30.81 kg/m   Physical Exam Vitals signs and nursing note reviewed.  Constitutional:      Appearance: He is well-developed.  HENT:     Head: Normocephalic.     Nose: Nose normal.  Eyes:     General: No scleral icterus.    Conjunctiva/sclera: Conjunctivae normal.  Neck:     Musculoskeletal: Neck supple.     Thyroid: No thyromegaly.  Cardiovascular:     Rate and Rhythm: Normal rate and regular rhythm.     Heart sounds: No murmur. No friction rub. No gallop.   Pulmonary:     Breath sounds: No stridor. No wheezing or rales.  Chest:     Chest wall: No tenderness.  Abdominal:     General: There is no distension.     Tenderness: There is no abdominal tenderness. There is no rebound.  Musculoskeletal: Normal range of motion.  Lymphadenopathy:     Cervical: No cervical adenopathy.  Skin:    Findings: No erythema or rash.  Neurological:     Mental Status: He is oriented to person, place, and time.     Motor: No abnormal muscle tone.     Coordination: Coordination  normal.  Psychiatric:        Behavior: Behavior normal.      ED Treatments / Results  Labs (all labs ordered are listed, but only abnormal results are displayed) Labs Reviewed  CBC WITH DIFFERENTIAL/PLATELET - Abnormal; Notable for the following components:      Result Value   Platelets 140 (*)    Neutro Abs 8.1 (*)    All other components within normal limits  COMPREHENSIVE METABOLIC PANEL - Abnormal; Notable for the following components:   Glucose, Bld 110 (*)    Creatinine, Ser 1.28 (*)    Calcium 8.7 (*)    GFR calc non Af Amer 54 (*)    All other components within normal limits  TROPONIN I  D-DIMER, QUANTITATIVE (NOT AT [facility])  TROPONIN I    EKG EKG Interpretation  Date/Time:  Monday June 01 2018 17:38:02 EST Ventricular Rate:  48 PR Interval:    QRS Duration: 146 QT Interval:  523 QTC Calculation: 468 R Axis:   -28 Text Interpretation:  Sinus or ectopic atrial bradycardia Prolonged PR interval Left bundle branch block Baseline wander in lead(s) V3 V4 Confirmed by Milton Ferguson [phone]) on 06/01/2018 8:29:39 PM   Radiology Dg Chest 2 View  Result Date: 06/01/2018 CLINICAL DATA:  Near syncope, blurry vision. EXAM: CHEST - 2 VIEW COMPARISON:  Chest x-ray dated 06/24/2016. FINDINGS: Stable cardiomegaly. Stable benign granuloma at the RIGHT lung base. Lungs otherwise clear. No pleural effusion or pneumothorax seen. No acute or suspicious osseous finding. IMPRESSION: No active cardiopulmonary disease. No evidence of pneumonia or pulmonary edema. Electronically Signed   By: Franki Cabot M.D.   On: 06/01/2018 18:24   Ct Head Wo Contrast  Result Date: 06/01/2018 CLINICAL DATA:  Near syncope, diaphoresis vision changes. Altered level of consciousness, unexplained. EXAM: CT HEAD WITHOUT CONTRAST TECHNIQUE: Contiguous axial images were obtained from the base of the skull through the vertex without intravenous contrast. COMPARISON:  Head CT dated 06/25/2016. FINDINGS:  Brain: Mild generalized age related parenchymal volume loss with commensurate dilatation of the ventricles and sulci. No mass, hemorrhage, edema or other evidence of acute parenchymal abnormality. No extra-axial hemorrhage. Vascular: Chronic calcified atherosclerotic changes of the large vessels at the skull base. No unexpected hyperdense vessel. Skull: No acute findings. Sinuses/Orbits: No acute finding. Other: None. IMPRESSION: Negative head CT. No intracranial mass, hemorrhage or edema. Electronically Signed   By: Franki Cabot M.D.   On: 06/01/2018 18:23    Procedures Procedures (including critical care time)  Medications Ordered in ED Medications  sodium chloride 0.9 % bolus 500 mL (0 mLs Intravenous Stopped 06/01/18 2038)  alum & mag hydroxide-simeth (MAALOX/MYLANTA) 200-200-20 MG/5ML suspension 30 mL (30 mLs Oral Given 06/01/18 1857)    And  lidocaine (XYLOCAINE) 2 % viscous mouth solution 15 mL (15 mLs Oral Given 06/01/18 1857)     Initial Impression / Assessment and Plan / ED Course  I have reviewed the triage vital signs and the nursing notes.  Pertinent labs & imaging results that were available during my care of the patient were reviewed by me and considered in my medical decision making (see chart for details).     Labs including d-dimer and troponin are unremarkable.  Patient feels much better now.  EKG shows bradycardia. Patient will be admitted to medicine Final Clinical Impressions(s) / ED Diagnoses   Final diagnoses:  None    ED Discharge Orders    None       Milton Ferguson, MD 06/04/18 [phone]

## 2018-06-03 ENCOUNTER — Ambulatory Visit: Payer: PPO | Admitting: Cardiovascular Disease

## 2018-06-03 ENCOUNTER — Telehealth: Payer: Self-pay | Admitting: Cardiovascular Disease

## 2018-06-03 ENCOUNTER — Encounter: Payer: Self-pay | Admitting: Cardiovascular Disease

## 2018-06-03 VITALS — BP 120/62 | HR 56 | Ht 74.0 in | Wt 239.0 lb

## 2018-06-03 DIAGNOSIS — I4891 Unspecified atrial fibrillation: Secondary | ICD-10-CM

## 2018-06-03 DIAGNOSIS — I739 Peripheral vascular disease, unspecified: Secondary | ICD-10-CM | POA: Diagnosis not present

## 2018-06-03 DIAGNOSIS — I10 Essential (primary) hypertension: Secondary | ICD-10-CM

## 2018-06-03 DIAGNOSIS — R58 Hemorrhage, not elsewhere classified: Secondary | ICD-10-CM | POA: Diagnosis not present

## 2018-06-03 DIAGNOSIS — R609 Edema, unspecified: Secondary | ICD-10-CM | POA: Diagnosis not present

## 2018-06-03 DIAGNOSIS — Z720 Tobacco use: Secondary | ICD-10-CM

## 2018-06-03 MED ORDER — METOPROLOL SUCCINATE ER 25 MG PO TB24: 12.5000 mg | ORAL_TABLET | Freq: Every day | ORAL | 3 refills | Status: DC

## 2018-06-03 NOTE — Telephone Encounter (Signed)
DPR in file. Spoke with pt daughter Steven Boyd. The pt is scheduled to see Dr. Gwenlyn Found today at 3:15pm. EMS was called to the pt home this morning due to uncontrollable bleeding of open ulcers in the patients legs, he does have PAD. EMS was able to get the bleeding under control. Steven Boyd sts that the episodes of bleeding has happened a couple of times in the last couple of weeks. The pt was seen in the ED recently for slow HR in the 40's. His atenolol was cut in half by the ED physician and they were instructed to f/u with Cardiology. Steven Boyd since the pt is currently stable, he should keep his appt with Dr.Berry today who can give recommendation regarding any additional medications changes. Steven Boyd agrees and voiced appreciation for the call back.

## 2018-06-03 NOTE — Assessment & Plan Note (Signed)
Patient continues to smoke is recalcitrant risk factor modification.

## 2018-06-03 NOTE — Progress Notes (Signed)
06/03/2018 BRAXSTON Boyd   12-27-1940  [id number]  Primary Physician Redmond School, MD Primary Cardiologist: Lorretta Harp MD Steven Boyd, Steven Boyd  HPI:  Steven Boyd is a 78 y.o.  mildly overweight married Caucasian male patient of Steven Boyd  and Dr. Percival Boyd who is referred for evaluation of peripheral arterial disease.  I last saw him in the office 05/29/2016.  He has a history of 60 pack years of tobacco abuse, hypertension and type 2 diabetes. His never had a heart attack or stroke. He had negative stress echo 3 years ago. He was being evaluated for preoperative clearance before cholecystectomy which ultimately was decided that he no longer needed. He was found to be in atrial fibrillation and was begun on Xarelto. He really denies claudication. He does have a diminished left pedal pulse. Since I saw him he has been doing well.  He was seen in the ER recently with bradycardia and presyncope.  His heart rate runs in the 40s and 50s on low-dose atenolol.  He is bleeding from his left foot.  Is unclear whether this was related to trauma.  I am going to stop his Xarelto for a week and change his beta-blocker.   Current Meds  Medication Sig  . atenolol (TENORMIN) 25 MG tablet TAKE 1 TABLET BY MOUTH TWICE DAILY (Patient taking differently: Take 25 mg by mouth every morning. )  . furosemide (LASIX) 20 MG tablet Take 60 mg by mouth daily.   . hydrALAZINE (APRESOLINE) 50 MG tablet TAKE 1 TABLET BY MOUTH TWICE DAILY (Patient taking differently: Take 50 mg by mouth 2 (two) times daily. )  . hydrocortisone butyrate (LUCOID) 0.1 % CREA cream Apply 1 application topically 2 (two) times daily.  Marland Kitchen lisinopril (PRINIVIL,ZESTRIL) 40 MG tablet Take 40 mg by mouth daily.  . Omega-3 Fatty Acids (FISH OIL) 1200 MG CAPS Take 1,200 mg by mouth daily.   Marland Kitchen POTASSIUM CHLORIDE PO Take 20 mg by mouth daily.  . potassium chloride SA (K-DUR,KLOR-CON) 20 MEQ tablet Take 20 mEq by mouth daily.  .  rivaroxaban (XARELTO) 20 MG TABS tablet Take 1 tablet (20 mg total) by mouth daily with supper. (Patient taking differently: Take 20 mg by mouth every evening. )  . tamsulosin (FLOMAX) 0.4 MG CAPS capsule Take 0.4 mg by mouth every evening.      Allergies  Allergen Reactions  . Protonix [Pantoprazole Sodium] Nausea And Vomiting    Patient states that after taking a few doses this happened , he stopped the medication.  . Dilaudid [Hydromorphone Hcl] Nausea And Vomiting    Social History   Socioeconomic History  . Marital status: Married    Spouse name: Not on file  . Number of children: Not on file  . Years of education: Not on file  . Highest education level: Not on file  Occupational History  . Not on file  Social Needs  . Financial resource strain: Not on file  . Food insecurity:    Worry: Not on file    Inability: Not on file  . Transportation needs:    Medical: Not on file    Non-medical: Not on file  Tobacco Use  . Smoking status: Current Every Day Smoker    Packs/day: 1.00    Years: 55.00    Pack years: 55.00    Types: Cigarettes  . Smokeless tobacco: Never Used  Substance and Sexual Activity  . Alcohol use: No  . Drug  use: No  . Sexual activity: Not on file  Lifestyle  . Physical activity:    Days per week: Not on file    Minutes per session: Not on file  . Stress: Not on file  Relationships  . Social connections:    Talks on phone: Not on file    Gets together: Not on file    Attends religious service: Not on file    Active member of club or organization: Not on file    Attends meetings of clubs or organizations: Not on file    Relationship status: Not on file  . Intimate partner violence:    Fear of current or ex partner: Not on file    Emotionally abused: Not on file    Physically abused: Not on file    Forced sexual activity: Not on file  Other Topics Concern  . Not on file  Social History Narrative  . Not on file     Review of  Systems: General: negative for chills, fever, night sweats or weight changes.  Cardiovascular: negative for chest pain, dyspnea on exertion, edema, orthopnea, palpitations, paroxysmal nocturnal dyspnea or shortness of breath Dermatological: negative for rash Respiratory: negative for cough or wheezing Urologic: negative for hematuria Abdominal: negative for nausea, vomiting, diarrhea, bright red blood per rectum, melena, or hematemesis Neurologic: negative for visual changes, syncope, or dizziness All other systems reviewed and are otherwise negative except as noted above.    Blood pressure 120/62, pulse (!) 56, height 6\' 2"  (1.88 m), weight 239 lb (108.4 kg).  General appearance: alert and no distress Neck: no adenopathy, no carotid bruit, no JVD, supple, symmetrical, trachea midline and thyroid not enlarged, symmetric, no tenderness/mass/nodules Lungs: clear to auscultation bilaterally Heart: regular rate and rhythm, S1, S2 normal, no murmur, click, rub or gallop Extremities: extremities normal, atraumatic, no cyanosis or edema Pulses: 2+ and symmetric Skin: Skin color, texture, turgor normal. No rashes or lesions Neurologic: Grossly normal  EKG not performed today  ASSESSMENT AND PLAN:   Tobacco use Patient continues to smoke is recalcitrant risk factor modification.  HTN (hypertension) Hypertension blood pressure measured today at 120/62.  He is on atenolol, hydralazine, and lisinopril.  He is somewhat bradycardic.  I am going to change his atenolol to Toprol-XL 12.5 mg a day.  Peripheral arterial disease ([place]) History of Buerger's disease with normal ABIs and TBI's.  Atrial fibrillation with controlled ventricular rate (HCC) History of PAF currently in sinus rhythm on Xarelto and low-dose beta-blocker.  His bleeding from his left foot.  We will hold his Xarelto for 1 week to allow the bleeding to stop.      Lorretta Harp MD FACP,FACC,FAHA, [facility] 06/03/2018 4:23  PM

## 2018-06-03 NOTE — Telephone Encounter (Signed)
New Message   Patient's daughter called because patient is on his way to the hospital due to excessive bleeding and would like a nurse to call her to discuss fathers condition.

## 2018-06-03 NOTE — Assessment & Plan Note (Signed)
History of Buerger's disease with normal ABIs and TBI's.

## 2018-06-03 NOTE — Assessment & Plan Note (Signed)
History of PAF currently in sinus rhythm on Xarelto and low-dose beta-blocker.  His bleeding from his left foot.  We will hold his Xarelto for 1 week to allow the bleeding to stop.

## 2018-06-03 NOTE — Patient Instructions (Signed)
Medication Instructions:  DO NOT TAKE YOUR XARELTO FOR ONE WEEK.  STOP TAKING YOUR ATENOLOL. START TAKING METOPROLOL (TOPROL-XL) 12.5 MG DAILY If you need a refill on your cardiac medications before your next appointment, please call your pharmacy.   Lab work: NONE If you have labs (blood work) drawn today and your tests are completely normal, you will receive your results only by: Marland Kitchen MyChart Message (if you have MyChart) OR . A paper copy in the mail If you have any lab test that is abnormal or we need to change your treatment, we will call you to review the results.  Testing/Procedures: NONE  Follow-Up: At [facility], you and your health needs are our priority.  As part of our continuing mission to provide you with exceptional heart care, we have created designated Provider Care Teams.  These Care Teams include your primary Cardiologist (physician) and Advanced Practice Providers (APPs -  Physician Assistants and Nurse Practitioners) who all work together to provide you with the care you need, when you need it. You will need a follow up appointment in 4 weeks with Steven Deforest, PA. You may schedule a follow up appointment as needed with Dr. Gwenlyn Boyd.

## 2018-06-03 NOTE — Assessment & Plan Note (Signed)
Hypertension blood pressure measured today at 120/62.  He is on atenolol, hydralazine, and lisinopril.  He is somewhat bradycardic.  I am going to change his atenolol to Toprol-XL 12.5 mg a day.

## 2018-06-05 DIAGNOSIS — I8311 Varicose veins of right lower extremity with inflammation: Secondary | ICD-10-CM | POA: Diagnosis not present

## 2018-06-05 DIAGNOSIS — I8312 Varicose veins of left lower extremity with inflammation: Secondary | ICD-10-CM | POA: Diagnosis not present

## 2018-06-05 DIAGNOSIS — R58 Hemorrhage, not elsewhere classified: Secondary | ICD-10-CM | POA: Diagnosis not present

## 2018-06-05 DIAGNOSIS — I83893 Varicose veins of bilateral lower extremities with other complications: Secondary | ICD-10-CM | POA: Diagnosis not present

## 2018-06-08 ENCOUNTER — Other Ambulatory Visit: Payer: Self-pay

## 2018-06-08 DIAGNOSIS — F17210 Nicotine dependence, cigarettes, uncomplicated: Secondary | ICD-10-CM | POA: Diagnosis not present

## 2018-06-08 DIAGNOSIS — R234 Changes in skin texture: Secondary | ICD-10-CM | POA: Diagnosis not present

## 2018-06-08 DIAGNOSIS — I10 Essential (primary) hypertension: Secondary | ICD-10-CM | POA: Diagnosis not present

## 2018-06-08 DIAGNOSIS — M109 Gout, unspecified: Secondary | ICD-10-CM | POA: Diagnosis not present

## 2018-06-08 DIAGNOSIS — M1990 Unspecified osteoarthritis, unspecified site: Secondary | ICD-10-CM | POA: Diagnosis not present

## 2018-06-08 DIAGNOSIS — I4891 Unspecified atrial fibrillation: Secondary | ICD-10-CM | POA: Diagnosis not present

## 2018-06-08 DIAGNOSIS — E1142 Type 2 diabetes mellitus with diabetic polyneuropathy: Secondary | ICD-10-CM | POA: Diagnosis not present

## 2018-06-08 DIAGNOSIS — I731 Thromboangiitis obliterans [Buerger's disease]: Secondary | ICD-10-CM | POA: Diagnosis not present

## 2018-06-08 DIAGNOSIS — E669 Obesity, unspecified: Secondary | ICD-10-CM | POA: Diagnosis not present

## 2018-06-08 DIAGNOSIS — F0390 Unspecified dementia without behavioral disturbance: Secondary | ICD-10-CM | POA: Diagnosis not present

## 2018-06-08 DIAGNOSIS — C61 Malignant neoplasm of prostate: Secondary | ICD-10-CM | POA: Diagnosis not present

## 2018-06-08 DIAGNOSIS — Z9181 History of falling: Secondary | ICD-10-CM | POA: Diagnosis not present

## 2018-06-08 DIAGNOSIS — Z7901 Long term (current) use of anticoagulants: Secondary | ICD-10-CM | POA: Diagnosis not present

## 2018-06-08 MED ORDER — HYDRALAZINE HCL 50 MG PO TABS: 50.0000 mg | ORAL_TABLET | Freq: Two times a day (BID) | ORAL | 3 refills | Status: DC

## 2018-06-08 NOTE — Telephone Encounter (Signed)
Rx(s) sent to pharmacy electronically.  

## 2018-06-09 DIAGNOSIS — E1142 Type 2 diabetes mellitus with diabetic polyneuropathy: Secondary | ICD-10-CM | POA: Diagnosis not present

## 2018-06-09 DIAGNOSIS — F17210 Nicotine dependence, cigarettes, uncomplicated: Secondary | ICD-10-CM | POA: Diagnosis not present

## 2018-06-09 DIAGNOSIS — E669 Obesity, unspecified: Secondary | ICD-10-CM | POA: Diagnosis not present

## 2018-06-09 DIAGNOSIS — Z9181 History of falling: Secondary | ICD-10-CM | POA: Diagnosis not present

## 2018-06-09 DIAGNOSIS — I4891 Unspecified atrial fibrillation: Secondary | ICD-10-CM | POA: Diagnosis not present

## 2018-06-09 DIAGNOSIS — Z7901 Long term (current) use of anticoagulants: Secondary | ICD-10-CM | POA: Diagnosis not present

## 2018-06-09 DIAGNOSIS — R234 Changes in skin texture: Secondary | ICD-10-CM | POA: Diagnosis not present

## 2018-06-10 DIAGNOSIS — I10 Essential (primary) hypertension: Secondary | ICD-10-CM | POA: Diagnosis not present

## 2018-06-10 DIAGNOSIS — Z7409 Other reduced mobility: Secondary | ICD-10-CM | POA: Diagnosis not present

## 2018-06-10 DIAGNOSIS — I4891 Unspecified atrial fibrillation: Secondary | ICD-10-CM | POA: Diagnosis not present

## 2018-06-19 DIAGNOSIS — E1142 Type 2 diabetes mellitus with diabetic polyneuropathy: Secondary | ICD-10-CM | POA: Diagnosis not present

## 2018-06-19 DIAGNOSIS — R234 Changes in skin texture: Secondary | ICD-10-CM | POA: Diagnosis not present

## 2018-06-19 DIAGNOSIS — F0390 Unspecified dementia without behavioral disturbance: Secondary | ICD-10-CM | POA: Diagnosis not present

## 2018-06-19 DIAGNOSIS — I4891 Unspecified atrial fibrillation: Secondary | ICD-10-CM | POA: Diagnosis not present

## 2018-06-23 DIAGNOSIS — F17210 Nicotine dependence, cigarettes, uncomplicated: Secondary | ICD-10-CM | POA: Diagnosis not present

## 2018-06-23 DIAGNOSIS — I4891 Unspecified atrial fibrillation: Secondary | ICD-10-CM | POA: Diagnosis not present

## 2018-06-23 DIAGNOSIS — C61 Malignant neoplasm of prostate: Secondary | ICD-10-CM | POA: Diagnosis not present

## 2018-06-23 DIAGNOSIS — M1990 Unspecified osteoarthritis, unspecified site: Secondary | ICD-10-CM | POA: Diagnosis not present

## 2018-06-23 DIAGNOSIS — E669 Obesity, unspecified: Secondary | ICD-10-CM | POA: Diagnosis not present

## 2018-06-23 DIAGNOSIS — F0390 Unspecified dementia without behavioral disturbance: Secondary | ICD-10-CM | POA: Diagnosis not present

## 2018-06-23 DIAGNOSIS — I731 Thromboangiitis obliterans [Buerger's disease]: Secondary | ICD-10-CM | POA: Diagnosis not present

## 2018-06-23 DIAGNOSIS — E1142 Type 2 diabetes mellitus with diabetic polyneuropathy: Secondary | ICD-10-CM | POA: Diagnosis not present

## 2018-06-23 DIAGNOSIS — I10 Essential (primary) hypertension: Secondary | ICD-10-CM | POA: Diagnosis not present

## 2018-06-23 DIAGNOSIS — Z9181 History of falling: Secondary | ICD-10-CM | POA: Diagnosis not present

## 2018-06-23 DIAGNOSIS — R234 Changes in skin texture: Secondary | ICD-10-CM | POA: Diagnosis not present

## 2018-06-23 DIAGNOSIS — M109 Gout, unspecified: Secondary | ICD-10-CM | POA: Diagnosis not present

## 2018-06-23 DIAGNOSIS — Z7901 Long term (current) use of anticoagulants: Secondary | ICD-10-CM | POA: Diagnosis not present

## 2018-06-24 DIAGNOSIS — I8312 Varicose veins of left lower extremity with inflammation: Secondary | ICD-10-CM | POA: Diagnosis not present

## 2018-06-24 DIAGNOSIS — I83203 Varicose veins of unspecified lower extremity with both ulcer of ankle and inflammation: Secondary | ICD-10-CM | POA: Diagnosis not present

## 2018-06-24 DIAGNOSIS — I8311 Varicose veins of right lower extremity with inflammation: Secondary | ICD-10-CM | POA: Diagnosis not present

## 2018-06-24 DIAGNOSIS — I83892 Varicose veins of left lower extremities with other complications: Secondary | ICD-10-CM | POA: Diagnosis not present

## 2018-06-24 DIAGNOSIS — R58 Hemorrhage, not elsewhere classified: Secondary | ICD-10-CM | POA: Diagnosis not present

## 2018-07-02 NOTE — Progress Notes (Signed)
Cardiology Office Note   Date:  07/03/2018   ID:  [name], DOB [birth year], MRN [id number]  PCP:  Redmond School, MD  Cardiologist: Dr. Percival Boyd   Chief Complaint  Patient presents with  . Follow-up   History of Present Illness: Steven Boyd is a 78 y.o. male who presents for 4 week follow up for bradycardia with medication changes, seen for Dr. Gwenlyn Boyd.   Mr. Steven Boyd has a prior hx of PAD (history of Buerger's syndrome in 45s), ongoing tobacco use, HTN and DM2. He was referred to Dr. Gwenlyn Boyd from Dr. Percival Boyd for PAD. He has no prior CAD hx. He had a stress echocardiogram 3 years ago that was negative. He was being evaluated for preoperative clearance before cholecystectomy which ultimately was decided that he no longer needed and was Boyd to be in atrial fibrillation. He was started on Xarelto. He was seen in the ER 05/2018 with bradycardia and presyncope. His heart rate was running in the 40s and 50s on low-dose atenolol. He was noted to be bleeding from his left foot and his Xarelto was stopped for a one week duration and his beta blocker was changed from atenolol to Toprol 12.5mg  daily.    Today he reports that he has had no further dizziness spells. He reports that he feels better now that he is on the carvedilol versus atenolol. He states that he has an appointment next week to have laser vein surgery for venous insufficiency. He states he has had no further bleeding form LE. He is wearing compression stockings for support. He states that he takes his BP at home daily with readings in the 130's. He continues to smoke cigarettes. He denies chest pain, SOB, PND, dizziness or palpations. He has chronic LE swelling related to his venous insufficiency. Reports medication compliance with Xarelto   Past Medical History:  Diagnosis Date  . Diabetes mellitus without complication (HCC)    diet controlled  . GERD (gastroesophageal reflux disease)   . Hard of hearing    bilateral, no hearing aids  . Hypertension   . Prostate cancer [facility])     Past Surgical History:  Procedure Laterality Date  . BIOPSY  09/23/2016   Procedure: BIOPSY;  Surgeon: Steven Houston, MD;  [facility];  Service: Endoscopy;;  gastric  . COLONOSCOPY N/A 05/08/2016   Procedure: COLONOSCOPY;  Surgeon: Steven Houston, MD;  [facility];  Service: Endoscopy;  Laterality: N/A;  2:25-moved to 100 per Ann  . ESOPHAGOGASTRODUODENOSCOPY N/A 06/07/2016   Procedure: ESOPHAGOGASTRODUODENOSCOPY (EGD);  Surgeon: Steven Houston, MD;  [facility];  Service: Endoscopy;  Laterality: N/A;  915  . ESOPHAGOGASTRODUODENOSCOPY N/A 09/23/2016   Procedure: ESOPHAGOGASTRODUODENOSCOPY (EGD);  Surgeon: Steven Houston, MD;  [facility];  Service: Endoscopy;  Laterality: N/A;  955  . FEMUR IM NAIL Left 12/24/2014   Procedure: INTRAMEDULLARY (IM) NAIL FEMORAL;  Surgeon: Marchia Bond, MD;  Location: [place];  Service: Orthopedics;  Laterality: Left;  . POLYPECTOMY  05/08/2016   Procedure: POLYPECTOMY;  Surgeon: Steven Houston, MD;  [facility];  Service: Endoscopy;;  sigmoid colon polypectomies x3      Current Outpatient Medications  Medication Sig Dispense Refill  . hydrALAZINE (APRESOLINE) 50 MG tablet Take 1 tablet (50 mg total) by mouth 2 (two) times daily. 180 tablet 3  . lisinopril (PRINIVIL,ZESTRIL) 40 MG tablet Take 40 mg by mouth daily.    Marland Kitchen  metoprolol succinate (TOPROL-XL) 25 MG 24 hr tablet Take 0.5 tablets (12.5 mg total) by mouth daily. Take with or immediately following a meal. 90 tablet 3  . Omega-3 Fatty Acids (FISH OIL) 1200 MG CAPS Take 1,200 mg by mouth daily.     . rivaroxaban (XARELTO) 20 MG TABS tablet Take 1 tablet (20 mg total) by mouth daily with supper. (Patient taking differently: Take 20 mg by mouth every evening. ) 28 tablet 0  . tamsulosin (FLOMAX) 0.4 MG CAPS capsule Take 0.4 mg by mouth every evening.      No current  facility-administered medications for this visit.    Iron Allergies:   Protonix [pantoprazole sodium] and Dilaudid [hydromorphone hcl]    Social History:  The patient  reports that he has been smoking cigarettes. He has a 55.00 pack-year smoking history. He has never used smokeless tobacco. He reports that he does not drink alcohol or use drugs.   Family History:  The patient's family history includes Diabetes in his mother; Heart Problems in his father.    ROS:  Please see the history of present illness. Otherwise, review of systems are positive for none. All other systems are reviewed and negative.    PHYSICAL EXAM: VS:  BP 140/74   Pulse 63   Ht 6\' 2"  (1.88 m)   Wt 237 lb 6.4 oz (107.7 kg)   SpO2 98%   BMI 30.48 kg/m  , BMI Body mass index is 30.48 kg/m.   General: Well developed, well nourished, NAD Skin: Warm, dry, intact  Head: Normocephalic, atraumatic, clear, moist mucus membranes. Neck: Negative for carotid bruits. No JVD Lungs:Clear to ausculation bilaterally. No wheezes, rales, or rhonchi. Breathing is unlabored. Cardiovascular: RRR with S1 S2. No murmurs, rubs, gallops, or LV heave appreciated. MSK: Strength and tone appear normal for age. 5/5 in all extremities Extremities: No edema. No clubbing or cyanosis. DP/PT pulses 2+ bilaterally Neuro: Alert and oriented. No focal deficits. No facial asymmetry. MAE spontaneously. Psych: Responds to questions appropriately with normal affect.    EKG:  EKG is not ordered today. Per my Recent Labs: 06/01/2018: ALT 28; BUN 19; Creatinine, Ser 1.28; Hemoglobin 15.5; Platelets 140; Potassium 3.6; Sodium 139    Lipid Panel    Component Value Date/Time   CHOL 161 05/13/2016 0837   TRIG 82 05/13/2016 0837   HDL 42 05/13/2016 0837   CHOLHDL 3.8 05/13/2016 0837   VLDL 16 05/13/2016 0837   LDLCALC 103 (H) 05/13/2016 0837     Wt Readings from Last 3 Encounters:  07/03/18 237 lb 6.4 oz (107.7 kg)  06/03/18 239 lb (108.4 kg)   06/01/18 240 lb (108.9 kg)     Other studies Reviewed: Additional studies/ records that were reviewed today include:   Stress echo 03/14/2014      ASSESSMENT AND PLAN:  1.  Hypertension: -Stable, 140/74 with heart rate 63 today -Last seen by Dr. Alvester Chou in which atenolol was changed to carvedilol 12.5 mg twice daily -Patient reports feeling better -BP readings with home monitor with SBP in the 120s to 130s   2.  PAF: -NSR today, HR 63 -Reports compliance with Xarelto -No recurrence of acute bleeding -To be seen by vascular surgery for procedure next week in which they will not need to hold Xarelto -CHA2DS2-Vasc score 3 (age, DM, HTN)  3.  DM2: -Diet controlled -Monitored by PCP  4.  Tobacco use: -Smoking cessation strongly encouraged -Cessation information provided to patient  5.  Buerger's disease: -  Now followed by vascular specialist with plans for vein laser procedure next week -No recurrent bleeding issues -Wearing compression stockings    Current medicines are reviewed at length with the patient today.  The patient does not have concerns regarding medicines.  The following changes have been made:  no change  Labs/ tests ordered today include: None  No orders of the defined types were placed in this encounter.   Disposition:   FU with Dr. Percival Boyd in 3 months  Signed, Kathyrn Drown, NP  07/03/2018 2:57 PM    [facility] [place], [place], [place]  [address] Phone: [phone]; Fax: [phone]

## 2018-07-03 ENCOUNTER — Ambulatory Visit: Payer: PPO | Admitting: Cardiology

## 2018-07-03 ENCOUNTER — Other Ambulatory Visit: Payer: Self-pay

## 2018-07-03 ENCOUNTER — Encounter: Payer: Self-pay | Admitting: Physician Assistant

## 2018-07-03 VITALS — BP 140/74 | HR 63 | Ht 74.0 in | Wt 237.4 lb

## 2018-07-03 DIAGNOSIS — I10 Essential (primary) hypertension: Secondary | ICD-10-CM | POA: Diagnosis not present

## 2018-07-03 DIAGNOSIS — I480 Paroxysmal atrial fibrillation: Secondary | ICD-10-CM | POA: Diagnosis not present

## 2018-07-03 DIAGNOSIS — E119 Type 2 diabetes mellitus without complications: Secondary | ICD-10-CM | POA: Diagnosis not present

## 2018-07-03 DIAGNOSIS — I731 Thromboangiitis obliterans [Buerger's disease]: Secondary | ICD-10-CM | POA: Diagnosis not present

## 2018-07-03 DIAGNOSIS — Z720 Tobacco use: Secondary | ICD-10-CM

## 2018-07-03 DIAGNOSIS — I4891 Unspecified atrial fibrillation: Secondary | ICD-10-CM | POA: Diagnosis not present

## 2018-07-03 DIAGNOSIS — I739 Peripheral vascular disease, unspecified: Secondary | ICD-10-CM | POA: Diagnosis not present

## 2018-07-03 NOTE — Patient Instructions (Signed)
Medication Instructions:  Your physician recommends that you continue on your current medications as directed. Please refer to the Current Medication list given to you today.  If you need a refill on your cardiac medications before your next appointment, please call your pharmacy.   Lab work: NONE   If you have labs (blood work) drawn today and your tests are completely normal, you will receive your results only by: Marland Kitchen MyChart Message (if you have MyChart) OR . A paper copy in the mail If you have any lab test that is abnormal or we need to change your treatment, we will call you to review the results.  Testing/Procedures: NONE  Follow-Up: At [facility], you and your health needs are our priority.  As part of our continuing mission to provide you with exceptional heart care, we have created designated Provider Care Teams.  These Care Teams include your primary Cardiologist (physician) and Advanced Practice Providers (APPs -  Physician Assistants and Nurse Practitioners) who all work together to provide you with the care you need, when you need it. You will need a follow up appointment in 3 months.  Please call our office 2 months in advance to schedule this appointment.  You may see Minus Breeding, MD or one of the following Advanced Practice Providers on your designated Care Team:   Rosaria Ferries, PA-C . Jory Sims, DNP, ANP  Any Other Special Instructions Will Be Listed Below (If Applicable).  Smoking Tobacco Information, Adult Smoking tobacco can be harmful to your health. Tobacco contains a poisonous (toxic), colorless chemical called nicotine. Nicotine is addictive. It changes the brain and can make it hard to stop smoking. Tobacco also has other toxic chemicals that can hurt your body and raise your risk of many cancers. How can smoking tobacco affect me? Smoking tobacco puts you at risk for:  Cancer. Smoking is most commonly associated with lung cancer, but can also lead  to cancer in other parts of the body.  Chronic obstructive pulmonary disease (COPD). This is a long-term lung condition that makes it hard to breathe. It also gets worse over time.  High blood pressure (hypertension), heart disease, stroke, or heart attack.  Lung infections, such as pneumonia.  Cataracts. This is when the lenses in the eyes become clouded.  Digestive problems. This may include peptic ulcers, heartburn, and gastroesophageal reflux disease (GERD).  Oral health problems, such as gum disease and tooth loss.  Loss of taste and smell. Smoking can affect your appearance by causing:  Wrinkles.  Yellow or stained teeth, fingers, and fingernails. Smoking tobacco can also affect your social life, because:  It may be challenging to find places to smoke when away from home. Many workplaces, [employer], hotels, and public places are tobacco-free.  Smoking is expensive. This is due to the cost of tobacco and the long-term costs of treating health problems from smoking.  Secondhand smoke may affect those around you. Secondhand smoke can cause lung cancer, breathing problems, and heart disease. Children of smokers have a higher risk for: ? Sudden infant death syndrome (SIDS). ? Ear infections. ? Lung infections. If you currently smoke tobacco, quitting now can help you:  Lead a longer and healthier life.  Look, smell, breathe, and feel better over time.  Save money.  Protect others from the harms of secondhand smoke. What actions can I take to prevent health problems? Quit smoking   Do not start smoking. Quit if you already do.  Make a plan to quit  smoking and commit to it. Look for programs to help you and ask your health care provider for recommendations and ideas.  Set a date and write down all the reasons you want to quit.  Let your friends and family know you are quitting so they can help and support you. Consider finding friends who also want to quit. It can be  easier to quit with someone else, so that you can support each other.  Talk with your health care provider about using nicotine replacement medicines to help you quit, such as gum, lozenges, patches, sprays, or pills.  Do not replace cigarette smoking with electronic cigarettes, which are commonly called e-cigarettes. The safety of e-cigarettes is not known, and some may contain harmful chemicals.  If you try to quit but return to smoking, stay positive. It is common to slip up when you first quit, so take it one day at a time.  Be prepared for cravings. When you feel the urge to smoke, chew gum or suck on hard candy. Lifestyle  Stay busy and take care of your body.  Drink enough fluid to keep your urine pale yellow.  Get plenty of exercise and eat a healthy diet. This can help prevent weight gain after quitting.  Monitor your eating habits. Quitting smoking can cause you to have a larger appetite than when you smoke.  Find ways to relax. Go out with friends or family to a movie or a restaurant where people do not smoke.  Ask your health care provider about having regular tests (screenings) to check for cancer. This may include blood tests, imaging tests, and other tests.  Find ways to manage your stress, such as meditation, yoga, or exercise. Where to find support To get support to quit smoking, consider:  Asking your health care provider for more information and resources.  Taking classes to learn more about quitting smoking.  Looking for local organizations that offer resources about quitting smoking.  Joining a support group for people who want to quit smoking in your local community.  Calling the smokefree.gov counselor helpline: 1-800-Quit-Now 937 560 9566) Where to find more information You may find more information about quitting smoking from:  HelpGuide.org: www.helpguide.org  https://hall.com/: smokefree.gov  American Lung Association: www.lung.org Contact a  health care provider if you:  Have problems breathing.  Notice that your lips, nose, or fingers turn blue.  Have chest pain.  Are coughing up blood.  Feel faint or you pass out.  Have other health changes that cause you to worry. Summary  Smoking tobacco can negatively affect your health, the health of those around you, your finances, and your social life.  Do not start smoking. Quit if you already do. If you need help quitting, ask your health care provider.  Think about joining a support group for people who want to quit smoking in your local community. There are many effective programs that will help you to quit this behavior. This information is not intended to replace advice given to you by your health care provider. Make sure you discuss any questions you have with your health care provider. Document Released: 04/23/2016 Document Revised: 05/28/2017 Document Reviewed: 04/23/2016 Elsevier Interactive Patient Education  2019 [employer].

## 2018-07-08 DIAGNOSIS — I83892 Varicose veins of left lower extremities with other complications: Secondary | ICD-10-CM | POA: Diagnosis not present

## 2018-07-08 DIAGNOSIS — I8311 Varicose veins of right lower extremity with inflammation: Secondary | ICD-10-CM | POA: Diagnosis not present

## 2018-07-08 DIAGNOSIS — I8312 Varicose veins of left lower extremity with inflammation: Secondary | ICD-10-CM | POA: Diagnosis not present

## 2018-07-09 DIAGNOSIS — Z7409 Other reduced mobility: Secondary | ICD-10-CM | POA: Diagnosis not present

## 2018-07-09 DIAGNOSIS — I4891 Unspecified atrial fibrillation: Secondary | ICD-10-CM | POA: Diagnosis not present

## 2018-07-09 DIAGNOSIS — I10 Essential (primary) hypertension: Secondary | ICD-10-CM | POA: Diagnosis not present

## 2018-07-10 DIAGNOSIS — I8312 Varicose veins of left lower extremity with inflammation: Secondary | ICD-10-CM | POA: Diagnosis not present

## 2018-07-10 DIAGNOSIS — I8311 Varicose veins of right lower extremity with inflammation: Secondary | ICD-10-CM | POA: Diagnosis not present

## 2018-07-10 DIAGNOSIS — I83892 Varicose veins of left lower extremities with other complications: Secondary | ICD-10-CM | POA: Diagnosis not present

## 2018-07-10 DIAGNOSIS — I83812 Varicose veins of left lower extremities with pain: Secondary | ICD-10-CM | POA: Diagnosis not present

## 2018-08-09 DIAGNOSIS — I10 Essential (primary) hypertension: Secondary | ICD-10-CM | POA: Diagnosis not present

## 2018-08-09 DIAGNOSIS — I4891 Unspecified atrial fibrillation: Secondary | ICD-10-CM | POA: Diagnosis not present

## 2018-08-09 DIAGNOSIS — Z7409 Other reduced mobility: Secondary | ICD-10-CM | POA: Diagnosis not present

## 2018-09-08 DIAGNOSIS — I10 Essential (primary) hypertension: Secondary | ICD-10-CM | POA: Diagnosis not present

## 2018-09-08 DIAGNOSIS — I4891 Unspecified atrial fibrillation: Secondary | ICD-10-CM | POA: Diagnosis not present

## 2018-09-08 DIAGNOSIS — Z7409 Other reduced mobility: Secondary | ICD-10-CM | POA: Diagnosis not present

## 2018-09-09 DIAGNOSIS — I4891 Unspecified atrial fibrillation: Secondary | ICD-10-CM | POA: Diagnosis not present

## 2018-09-09 DIAGNOSIS — C61 Malignant neoplasm of prostate: Secondary | ICD-10-CM | POA: Diagnosis not present

## 2018-09-09 DIAGNOSIS — F17290 Nicotine dependence, other tobacco product, uncomplicated: Secondary | ICD-10-CM | POA: Diagnosis not present

## 2018-09-09 DIAGNOSIS — B001 Herpesviral vesicular dermatitis: Secondary | ICD-10-CM | POA: Diagnosis not present

## 2018-09-09 DIAGNOSIS — Z79899 Other long term (current) drug therapy: Secondary | ICD-10-CM | POA: Diagnosis not present

## 2018-09-09 DIAGNOSIS — L719 Rosacea, unspecified: Secondary | ICD-10-CM | POA: Diagnosis not present

## 2018-09-09 DIAGNOSIS — I10 Essential (primary) hypertension: Secondary | ICD-10-CM | POA: Diagnosis not present

## 2018-09-09 DIAGNOSIS — J329 Chronic sinusitis, unspecified: Secondary | ICD-10-CM | POA: Diagnosis not present

## 2018-09-09 DIAGNOSIS — Z6830 Body mass index (BMI) 30.0-30.9, adult: Secondary | ICD-10-CM | POA: Diagnosis not present

## 2018-09-09 DIAGNOSIS — E7849 Other hyperlipidemia: Secondary | ICD-10-CM | POA: Diagnosis not present

## 2018-09-09 DIAGNOSIS — E6609 Other obesity due to excess calories: Secondary | ICD-10-CM | POA: Diagnosis not present

## 2018-09-09 DIAGNOSIS — K219 Gastro-esophageal reflux disease without esophagitis: Secondary | ICD-10-CM | POA: Diagnosis not present

## 2018-10-01 ENCOUNTER — Telehealth: Payer: PPO | Admitting: Cardiology

## 2018-10-01 ENCOUNTER — Other Ambulatory Visit (HOSPITAL_COMMUNITY): Payer: Self-pay | Admitting: Internal Medicine

## 2018-10-01 ENCOUNTER — Other Ambulatory Visit: Payer: Self-pay

## 2018-10-01 ENCOUNTER — Ambulatory Visit (HOSPITAL_COMMUNITY)
Admission: RE | Admit: 2018-10-01 | Discharge: 2018-10-01 | Disposition: A | Discharge status: Home / Self Care | Payer: PPO | Source: Ambulatory Visit | Attending: Internal Medicine | Admitting: Internal Medicine

## 2018-10-01 DIAGNOSIS — R0789 Other chest pain: Secondary | ICD-10-CM

## 2018-10-01 DIAGNOSIS — J8410 Pulmonary fibrosis, unspecified: Secondary | ICD-10-CM | POA: Diagnosis not present

## 2018-10-09 DIAGNOSIS — Z7409 Other reduced mobility: Secondary | ICD-10-CM | POA: Diagnosis not present

## 2018-10-09 DIAGNOSIS — I10 Essential (primary) hypertension: Secondary | ICD-10-CM | POA: Diagnosis not present

## 2018-10-09 DIAGNOSIS — I4891 Unspecified atrial fibrillation: Secondary | ICD-10-CM | POA: Diagnosis not present

## 2018-11-08 DIAGNOSIS — Z7409 Other reduced mobility: Secondary | ICD-10-CM | POA: Diagnosis not present

## 2018-11-08 DIAGNOSIS — I4891 Unspecified atrial fibrillation: Secondary | ICD-10-CM | POA: Diagnosis not present

## 2018-11-08 DIAGNOSIS — I10 Essential (primary) hypertension: Secondary | ICD-10-CM | POA: Diagnosis not present

## 2018-11-24 ENCOUNTER — Telehealth: Payer: Self-pay

## 2018-11-24 NOTE — Telephone Encounter (Signed)
Xarelto patient assistance form faxed 8/4

## 2018-11-26 DIAGNOSIS — M1991 Primary osteoarthritis, unspecified site: Secondary | ICD-10-CM | POA: Diagnosis not present

## 2018-11-26 DIAGNOSIS — Z7689 Persons encountering health services in other specified circumstances: Secondary | ICD-10-CM | POA: Diagnosis not present

## 2018-11-26 DIAGNOSIS — I10 Essential (primary) hypertension: Secondary | ICD-10-CM | POA: Diagnosis not present

## 2018-11-26 DIAGNOSIS — Z6829 Body mass index (BMI) 29.0-29.9, adult: Secondary | ICD-10-CM | POA: Diagnosis not present

## 2018-11-26 DIAGNOSIS — Z1389 Encounter for screening for other disorder: Secondary | ICD-10-CM | POA: Diagnosis not present

## 2018-11-26 DIAGNOSIS — K219 Gastro-esophageal reflux disease without esophagitis: Secondary | ICD-10-CM | POA: Diagnosis not present

## 2018-11-26 DIAGNOSIS — Z0000 Encounter for general adult medical examination without abnormal findings: Secondary | ICD-10-CM | POA: Diagnosis not present

## 2018-11-26 DIAGNOSIS — E1140 Type 2 diabetes mellitus with diabetic neuropathy, unspecified: Secondary | ICD-10-CM | POA: Diagnosis not present

## 2018-11-26 DIAGNOSIS — E119 Type 2 diabetes mellitus without complications: Secondary | ICD-10-CM | POA: Diagnosis not present

## 2018-12-09 DIAGNOSIS — I10 Essential (primary) hypertension: Secondary | ICD-10-CM | POA: Diagnosis not present

## 2018-12-09 DIAGNOSIS — Z7409 Other reduced mobility: Secondary | ICD-10-CM | POA: Diagnosis not present

## 2018-12-09 DIAGNOSIS — I4891 Unspecified atrial fibrillation: Secondary | ICD-10-CM | POA: Diagnosis not present

## 2018-12-22 ENCOUNTER — Ambulatory Visit: Payer: PPO | Admitting: Cardiology

## 2018-12-29 ENCOUNTER — Telehealth: Payer: Self-pay | Admitting: *Deleted

## 2018-12-29 NOTE — Telephone Encounter (Signed)
Pt's dtr, Cathie Olden states pt has an appt 01/13/2019 with Dr. Milinda Pointer and he is scheduled to come in to have his two big toenails removed and his is on blood thinners.

## 2018-12-30 NOTE — Telephone Encounter (Signed)
Yes usually about three days in advance of procedure.  However he may want to let his cardiologist know that he is doing this.

## 2018-12-30 NOTE — Telephone Encounter (Signed)
Does he need to stop his blood thinner a couple of days before coming for appt?  He is currently on Xarelto.   Please advise

## 2019-01-01 ENCOUNTER — Ambulatory Visit: Payer: PPO | Admitting: Cardiovascular Disease

## 2019-01-01 NOTE — Telephone Encounter (Signed)
Spoke with Daughter and informed her of Dr. Stephenie Acres instructions to stop Xarelto 3 days prior to procedure and inform Cardiologist.  She stated that she had to cancel his upcoming appt, but verbalized understanding when she reschedules.

## 2019-01-09 DIAGNOSIS — Z7409 Other reduced mobility: Secondary | ICD-10-CM | POA: Diagnosis not present

## 2019-01-09 DIAGNOSIS — I10 Essential (primary) hypertension: Secondary | ICD-10-CM | POA: Diagnosis not present

## 2019-01-09 DIAGNOSIS — I4891 Unspecified atrial fibrillation: Secondary | ICD-10-CM | POA: Diagnosis not present

## 2019-01-13 ENCOUNTER — Ambulatory Visit: Payer: Self-pay | Admitting: Podiatry

## 2019-01-25 ENCOUNTER — Telehealth: Payer: Self-pay

## 2019-01-25 NOTE — Telephone Encounter (Signed)
Spoke with pt daughter who handles pt appts. She is okay with appt change from 10/21 to 10/7 at 11:30am. She will relay info to pt. Appt changed in epic

## 2019-01-27 ENCOUNTER — Encounter: Payer: Self-pay | Admitting: Cardiovascular Disease

## 2019-01-27 ENCOUNTER — Ambulatory Visit: Payer: PPO | Admitting: Cardiovascular Disease

## 2019-01-27 ENCOUNTER — Other Ambulatory Visit: Payer: Self-pay

## 2019-01-27 VITALS — BP 109/69 | HR 66 | Temp 98.4°F | Ht 74.0 in | Wt 237.0 lb

## 2019-01-27 DIAGNOSIS — Z008 Encounter for other general examination: Secondary | ICD-10-CM | POA: Diagnosis not present

## 2019-01-27 DIAGNOSIS — Z720 Tobacco use: Secondary | ICD-10-CM | POA: Diagnosis not present

## 2019-01-27 DIAGNOSIS — I10 Essential (primary) hypertension: Secondary | ICD-10-CM | POA: Diagnosis not present

## 2019-01-27 DIAGNOSIS — I4891 Unspecified atrial fibrillation: Secondary | ICD-10-CM

## 2019-01-27 NOTE — Assessment & Plan Note (Signed)
Ongoing, counseled about stopping

## 2019-01-27 NOTE — Progress Notes (Signed)
01/27/2019 Steven Boyd   1940/06/17  [id number]  Primary Physician Steven School, MD Primary Cardiologist: Steven Harp MD Steven Boyd, [place]  HPI:  Steven Boyd is a 79 y.o.  mildly overweight married Caucasian male patient ofHao Romie Boyd Dr. Serita Boyd is referred for evaluation of peripheral arterial disease.  I last saw him in the office 06/03/2018.  He is accompanied by his daughter Steven Boyd today. He has a history of 60 pack years of tobacco abuse, hypertension and type 2 diabetes. His never had a heart attack or stroke. He had negative stress echo 3 years ago. He was being evaluated for preoperative clearance before cholecystectomy which ultimately was decided that he no longer needed. He was found to be in atrial fibrillation and was begun on Xarelto. He really denies claudication. He does have a diminished left pedal pulse.  Since I saw him he has been doing well.  He was seen in the ER recently with bradycardia and presyncope.  His heart rate runs in the 40s and 50s on low-dose atenolol.  He is bleeding from his left foot.  Is unclear whether this was related to trauma.  I stopped his Xarelto for a week and change his beta-blocker.  Since I saw him 8 months ago he continues to do well.  Of note, his segmental pressures performed 06/21/2016 were essentially normal.  He denies claudication.   Current Meds  Medication Sig  . hydrALAZINE (APRESOLINE) 50 MG tablet Take 1 tablet (50 mg total) by mouth 2 (two) times daily.  Marland Kitchen lisinopril (PRINIVIL,ZESTRIL) 40 MG tablet Take 40 mg by mouth daily.  . metoprolol succinate (TOPROL-XL) 25 MG 24 hr tablet Take 0.5 tablets (12.5 mg total) by mouth daily. Take with or immediately following a meal.  . Omega-3 Fatty Acids (FISH OIL) 1200 MG CAPS Take 500 mg by mouth daily.   . rivaroxaban (XARELTO) 20 MG TABS tablet Take 1 tablet (20 mg total) by mouth daily with supper. (Patient taking differently: Take 20 mg by mouth  every evening. )  . tamsulosin (FLOMAX) 0.4 MG CAPS capsule Take 0.4 mg by mouth every evening.      Allergies  Allergen Reactions  . Protonix [Pantoprazole Sodium] Nausea And Vomiting    Patient states that after taking a few doses this happened , he stopped the medication.  . Dilaudid [Hydromorphone Hcl] Nausea And Vomiting    Social History   Socioeconomic History  . Marital status: Married    Spouse name: Not on file  . Number of children: Not on file  . Years of education: Not on file  . Highest education level: Not on file  Occupational History  . Not on file  Social Needs  . Financial resource strain: Not on file  . Food insecurity    Worry: Not on file    Inability: Not on file  . Transportation needs    Medical: Not on file    Non-medical: Not on file  Tobacco Use  . Smoking status: Current Every Day Smoker    Packs/day: 1.00    Years: 55.00    Pack years: 55.00    Types: Cigarettes  . Smokeless tobacco: Never Used  Substance and Sexual Activity  . Alcohol use: No  . Drug use: No  . Sexual activity: Not on file  Lifestyle  . Physical activity    Days per week: Not on file    Minutes per session: Not on file  .  Stress: Not on file  Relationships  . Social Herbalist on phone: Not on file    Gets together: Not on file    Attends religious service: Not on file    Active member of club or organization: Not on file    Attends meetings of clubs or organizations: Not on file    Relationship status: Not on file  . Intimate partner violence    Fear of current or ex partner: Not on file    Emotionally abused: Not on file    Physically abused: Not on file    Forced sexual activity: Not on file  Other Topics Concern  . Not on file  Social History Narrative  . Not on file     Review of Systems: General: negative for chills, fever, night sweats or weight changes.  Cardiovascular: negative for chest pain, dyspnea on exertion, edema, orthopnea,  palpitations, paroxysmal nocturnal dyspnea or shortness of breath Dermatological: negative for rash Respiratory: negative for cough or wheezing Urologic: negative for hematuria Abdominal: negative for nausea, vomiting, diarrhea, bright red blood per rectum, melena, or hematemesis Neurologic: negative for visual changes, syncope, or dizziness All other systems reviewed and are otherwise negative except as noted above.    Blood pressure 109/69, pulse 66, temperature 98.4 F (36.9 C), height 6\' 2"  (1.88 m), weight 237 lb (107.5 kg), SpO2 96 %.  General appearance: alert and no distress Neck: no adenopathy, no carotid bruit, no JVD, supple, symmetrical, trachea midline and thyroid not enlarged, symmetric, no tenderness/mass/nodules Lungs: clear to auscultation bilaterally Heart: regular rate and rhythm, S1, S2 normal, no murmur, click, rub or gallop Extremities: extremities normal, atraumatic, no cyanosis or edema Pulses: 2+ and symmetric Skin: Skin color, texture, turgor normal. No rashes or lesions Neurologic: Alert and oriented X 3, normal strength and tone. Normal symmetric reflexes. Normal coordination and gait  EKG sinus rhythm at 65 with nonspecific IVCD.  I personally reviewed this EKG.  ASSESSMENT AND PLAN:   HTN (hypertension) History of essential hypertension with blood pressure measured today at 109/69.  He is on hydralazine, Toprol and lisinopril.  Atrial fibrillation with controlled ventricular rate (HCC) History of paroxysmal atrial fibrillation currently in sinus rhythm on Xarelto oral anticoagulation.  Tobacco use Ongoing, counseled about stopping      Steven Harp MD [facility], [facility] 01/27/2019 12:06 PM

## 2019-01-27 NOTE — Assessment & Plan Note (Signed)
History of paroxysmal atrial fibrillation currently in sinus rhythm on Xarelto oral anticoagulation.

## 2019-01-27 NOTE — Assessment & Plan Note (Signed)
History of essential hypertension with blood pressure measured today at 109/69.  He is on hydralazine, Toprol and lisinopril.

## 2019-01-27 NOTE — Patient Instructions (Signed)
Medication Instructions:  Your physician recommends that you continue on your current medications as directed. Please refer to the Current Medication list given to you today.  If you need a refill on your cardiac medications before your next appointment, please call your pharmacy.   Lab work: NONE If you have labs (blood work) drawn today and your tests are completely normal, you will receive your results only by: Marland Kitchen MyChart Message (if you have MyChart) OR . A paper copy in the mail If you have any lab test that is abnormal or we need to change your treatment, we will call you to review the results.  Testing/Procedures: NONE  Follow-Up: At [facility], you and your health needs are our priority.  As part of our continuing mission to provide you with exceptional heart care, we have created designated Provider Care Teams.  These Care Teams include your primary Cardiologist (physician) and Advanced Practice Providers (APPs -  Physician Assistants and Nurse Practitioners) who all work together to provide you with the care you need, when you need it. . You will need a follow up appointment in 6 months with Dr. Minus Breeding.  Please call our office 2 months in advance to schedule this appointment. You may follow up with Dr. Quay Burow as needed.

## 2019-02-05 DIAGNOSIS — Z23 Encounter for immunization: Secondary | ICD-10-CM | POA: Diagnosis not present

## 2019-02-08 DIAGNOSIS — I10 Essential (primary) hypertension: Secondary | ICD-10-CM | POA: Diagnosis not present

## 2019-02-08 DIAGNOSIS — Z7409 Other reduced mobility: Secondary | ICD-10-CM | POA: Diagnosis not present

## 2019-02-08 DIAGNOSIS — I4891 Unspecified atrial fibrillation: Secondary | ICD-10-CM | POA: Diagnosis not present

## 2019-02-10 ENCOUNTER — Ambulatory Visit: Payer: PPO | Admitting: Cardiovascular Disease

## 2019-03-11 DIAGNOSIS — I4891 Unspecified atrial fibrillation: Secondary | ICD-10-CM | POA: Diagnosis not present

## 2019-03-11 DIAGNOSIS — I10 Essential (primary) hypertension: Secondary | ICD-10-CM | POA: Diagnosis not present

## 2019-03-11 DIAGNOSIS — Z7409 Other reduced mobility: Secondary | ICD-10-CM | POA: Diagnosis not present

## 2019-04-01 ENCOUNTER — Other Ambulatory Visit: Payer: Self-pay

## 2019-04-01 DIAGNOSIS — Z20822 Contact with and (suspected) exposure to covid-19: Secondary | ICD-10-CM

## 2019-04-02 ENCOUNTER — Telehealth: Payer: Self-pay | Admitting: Internal Medicine

## 2019-04-02 LAB — NOVEL CORONAVIRUS, NAA: SARS-CoV-2, NAA: NOT DETECTED

## 2019-04-02 NOTE — Telephone Encounter (Signed)
Patient is calling to receive his negative COVID test result. Patient expressed understanding.

## 2019-04-10 DIAGNOSIS — I4891 Unspecified atrial fibrillation: Secondary | ICD-10-CM | POA: Diagnosis not present

## 2019-04-10 DIAGNOSIS — I10 Essential (primary) hypertension: Secondary | ICD-10-CM | POA: Diagnosis not present

## 2019-04-10 DIAGNOSIS — Z7409 Other reduced mobility: Secondary | ICD-10-CM | POA: Diagnosis not present

## 2019-04-12 ENCOUNTER — Other Ambulatory Visit: Payer: PPO

## 2019-05-11 DIAGNOSIS — G629 Polyneuropathy, unspecified: Secondary | ICD-10-CM | POA: Diagnosis not present

## 2019-05-11 DIAGNOSIS — I10 Essential (primary) hypertension: Secondary | ICD-10-CM | POA: Diagnosis not present

## 2019-05-11 DIAGNOSIS — Z7409 Other reduced mobility: Secondary | ICD-10-CM | POA: Diagnosis not present

## 2019-05-11 DIAGNOSIS — Z6829 Body mass index (BMI) 29.0-29.9, adult: Secondary | ICD-10-CM | POA: Diagnosis not present

## 2019-05-11 DIAGNOSIS — L309 Dermatitis, unspecified: Secondary | ICD-10-CM | POA: Diagnosis not present

## 2019-05-11 DIAGNOSIS — Z1389 Encounter for screening for other disorder: Secondary | ICD-10-CM | POA: Diagnosis not present

## 2019-05-11 DIAGNOSIS — I4891 Unspecified atrial fibrillation: Secondary | ICD-10-CM | POA: Diagnosis not present

## 2019-05-11 DIAGNOSIS — M130 Polyarthritis, unspecified: Secondary | ICD-10-CM | POA: Diagnosis not present

## 2019-05-11 DIAGNOSIS — M2550 Pain in unspecified joint: Secondary | ICD-10-CM | POA: Diagnosis not present

## 2019-05-21 ENCOUNTER — Other Ambulatory Visit: Payer: Self-pay | Admitting: Cardiovascular Disease

## 2019-05-30 ENCOUNTER — Ambulatory Visit
Admission: EM | Admit: 2019-05-30 | Discharge: 2019-05-30 | Disposition: A | Discharge status: Home / Self Care | Payer: PPO

## 2019-05-30 ENCOUNTER — Other Ambulatory Visit: Payer: Self-pay

## 2019-05-30 DIAGNOSIS — H5789 Other specified disorders of eye and adnexa: Secondary | ICD-10-CM | POA: Diagnosis not present

## 2019-05-30 DIAGNOSIS — H00012 Hordeolum externum right lower eyelid: Secondary | ICD-10-CM | POA: Diagnosis not present

## 2019-05-30 NOTE — ED Triage Notes (Signed)
Pt presents with blood shot right eye , pt states happened 3 nights ago and states it feels as if there is a " rock " in his eye

## 2019-05-30 NOTE — Discharge Instructions (Signed)
You have a stye on your lower R outer eyelid. Use some warm compresses and OTC stye ointment. If stye persists, follow up with your eye doctor.  You also have a burst blood vessel in you R outer eye. This can occur as a side effect of xarelto. It should self-resolve in about 2-3 weeks without treatment.  Follow up with your eye doctor and primary care provider as needed. Go to the Emergency Room for sudden vision loss, sudden increased eye pain, other concerning symptoms.

## 2019-05-30 NOTE — ED Provider Notes (Signed)
[facility]    CSN: [id number] Arrival date & time: 05/30/19  0804      History   Chief Complaint Chief Complaint  Patient presents with  . Eye Pain    HPI Steven Boyd is a 79 y.o. male.   Reports that he feels like he has something in his R eye, denies injury. Reports that his eye has been red for about 3 days, taking Xarelto daily. Denies eye pain, vision loss, vision changes, headaches. Denies recent medication changes or increases in blood pressure when checking at home.   The history is provided by the patient and a relative.  Eye Pain Pertinent negatives include no chest pain, no abdominal pain and no shortness of breath.    Past Medical History:  Diagnosis Date  . Diabetes mellitus without complication (HCC)    diet controlled  . GERD (gastroesophageal reflux disease)   . Hard of hearing    bilateral, no hearing aids  . Hypertension   . Prostate cancer [facility])     Patient Active Problem List   Diagnosis Date Noted  . Atrial fibrillation with controlled ventricular rate ([place]) 05/26/2017  . Diet-controlled diabetes mellitus ([place]) 05/26/2017  . Buerger disease ([place]) 05/26/2017  . Tobacco use 05/26/2017  . History of Helicobacter infection 08/15/2016  . Chronic gastric ulcer 08/15/2016  . Abdominal pain, epigastric 05/29/2016  . Peripheral arterial disease ([place]) 05/29/2016  . Encounter for screening colonoscopy 03/18/2016  . Intertrochanteric fracture of left hip ([place]) 12/24/2014  . HTN (hypertension) 12/24/2014    Past Surgical History:  Procedure Laterality Date  . BIOPSY  09/23/2016   Procedure: BIOPSY;  Surgeon: Rogene Houston, MD;  [facility];  Service: Endoscopy;;  gastric  . COLONOSCOPY N/A 05/08/2016   Procedure: COLONOSCOPY;  Surgeon: Rogene Houston, MD;  [facility];  Service: Endoscopy;  Laterality: N/A;  2:25-moved to 100 per Ann  . ESOPHAGOGASTRODUODENOSCOPY N/A 06/07/2016   Procedure:  ESOPHAGOGASTRODUODENOSCOPY (EGD);  Surgeon: Rogene Houston, MD;  [facility];  Service: Endoscopy;  Laterality: N/A;  915  . ESOPHAGOGASTRODUODENOSCOPY N/A 09/23/2016   Procedure: ESOPHAGOGASTRODUODENOSCOPY (EGD);  Surgeon: Rogene Houston, MD;  [facility];  Service: Endoscopy;  Laterality: N/A;  955  . FEMUR IM NAIL Left 12/24/2014   Procedure: INTRAMEDULLARY (IM) NAIL FEMORAL;  Surgeon: Marchia Bond, MD;  Location: [place];  Service: Orthopedics;  Laterality: Left;  . POLYPECTOMY  05/08/2016   Procedure: POLYPECTOMY;  Surgeon: Rogene Houston, MD;  [facility];  Service: Endoscopy;;  sigmoid colon polypectomies x3        Home Medications    Prior to Admission medications   Medication Sig Start Date End Date Taking? Authorizing Provider  hydrALAZINE (APRESOLINE) 50 MG tablet Take 1 tablet by mouth twice daily 05/24/19   Lorretta Harp, MD  lisinopril (PRINIVIL,ZESTRIL) 40 MG tablet Take 40 mg by mouth daily. 05/16/18   [provider]  metoprolol succinate (TOPROL-XL) 25 MG 24 hr tablet Take 0.5 tablets (12.5 mg total) by mouth daily. Take with or immediately following a meal. 06/03/18 01/27/19  Lorretta Harp, MD  Omega-3 Fatty Acids (FISH OIL) 1200 MG CAPS Take 500 mg by mouth daily.     [provider]  rivaroxaban (XARELTO) 20 MG TABS tablet Take 1 tablet (20 mg total) by mouth daily with supper. Patient taking differently: Take 20 mg by mouth every evening.  09/24/16   Hildred Laser  U, MD  tamsulosin (FLOMAX) 0.4 MG CAPS capsule Take 0.4 mg by mouth every evening.     [provider]    Family History Family History  Problem Relation Age of Onset  . Diabetes Mother   . Heart Problems Father        s/p heart valve repair 10 years before he passed away    Social History Social History   Tobacco Use  . Smoking status: Current Every Day Smoker    Packs/day: 1.00    Years: 55.00    Pack years: 55.00    Types:  Cigarettes  . Smokeless tobacco: Never Used  Substance Use Topics  . Alcohol use: No  . Drug use: No     Allergies   Protonix [pantoprazole sodium] and Dilaudid [hydromorphone hcl]   Review of Systems Review of Systems  Constitutional: Negative for chills, fatigue and fever.  HENT: Negative for ear pain and sore throat.   Eyes: Positive for redness. Negative for pain and visual disturbance.       R eye   Respiratory: Negative for cough and shortness of breath.   Cardiovascular: Negative for chest pain and palpitations.  Gastrointestinal: Negative for abdominal pain and vomiting.  Genitourinary: Negative for dysuria and hematuria.  Musculoskeletal: Negative for arthralgias and back pain.  Skin: Negative for color change and rash.  Neurological: Negative for seizures and syncope.  All other systems reviewed and are negative.    Physical Exam Triage Vital Signs ED Triage Vitals  Enc Vitals Group     BP 05/30/19 0820 (!) 162/78     Pulse Rate 05/30/19 0820 (!) 56     Resp 05/30/19 0820 18     Temp 05/30/19 0820 98.5 F (36.9 C)     Temp src --      SpO2 05/30/19 0820 94 %     Weight --      Height --      Head Circumference --      Peak Flow --      Pain Score 05/30/19 0817 3     Pain Loc --      Pain Edu? --      Excl. in [place]? --    No data found.  Updated Vital Signs BP (!) 162/78   Pulse (!) 56   Temp 98.5 F (36.9 C)   Resp 18   SpO2 94%      Physical Exam Vitals and nursing note reviewed.  Constitutional:      General: He is not in acute distress.    Appearance: He is well-developed.  HENT:     Head: Normocephalic and atraumatic.     Right Ear: Tympanic membrane normal.     Left Ear: Tympanic membrane normal.     Nose: Nose normal.     Mouth/Throat:     Mouth: Mucous membranes are moist.     Pharynx: No posterior oropharyngeal erythema.  Eyes:     General: Lids are everted, no foreign bodies appreciated. Vision grossly intact. Gaze aligned  appropriately.        Right eye: Hordeolum present.     Extraocular Movements: Extraocular movements intact.     Conjunctiva/sclera: Conjunctivae normal.   Cardiovascular:     Rate and Rhythm: Normal rate and regular rhythm.     Heart sounds: No murmur.  Pulmonary:     Effort: Pulmonary effort is normal. No respiratory distress.     Breath sounds: Normal breath sounds.  Abdominal:     General: Bowel sounds are normal.     Palpations: Abdomen is soft.     Tenderness: There is no abdominal tenderness.  Musculoskeletal:     Cervical back: Neck supple.  Skin:    General: Skin is warm and dry.  Neurological:     General: No focal deficit present.     Mental Status: He is alert and oriented to person, place, and time.  Psychiatric:        Mood and Affect: Mood normal.        Behavior: Behavior normal.      UC Treatments / Results  Labs (all labs ordered are listed, but only abnormal results are displayed) Labs Reviewed - No data to display  EKG   Radiology No results found.  Procedures Procedures (including critical care time)  Medications Ordered in UC Medications - No data to display  Initial Impression / Assessment and Plan / UC Course  I have reviewed the triage vital signs and the nursing notes.  Pertinent labs & imaging results that were available during my care of the patient were reviewed by me and considered in my medical decision making (see chart for details).     Eye redness, with foreign body sensation. Takes Xarelto daily. Eye redness will self resolve in about 2-3 weeks. Did not appreciate lacerations to sclera. Hordeolum also present, instructed on care and OTC ointment for treatment. Instructed to follow up with ophthalmology. Instructed on when to report to the Emergency department.  Final Clinical Impressions(s) / UC Diagnoses   Final diagnoses:  Hordeolum externum of right lower eyelid  Redness of eye, right     Discharge Instructions       You have a stye on your lower R outer eyelid. Use some warm compresses and OTC stye ointment. If stye persists, follow up with your eye doctor.  You also have a burst blood vessel in you R outer eye. This can occur as a side effect of xarelto. It should self-resolve in about 2-3 weeks without treatment.  Follow up with your eye doctor and primary care provider as needed. Go to the Emergency Room for sudden vision loss, sudden increased eye pain, other concerning symptoms.     ED Prescriptions    None     I have reviewed the PDMP during this encounter.   Faustino Congress, NP 05/30/19 1004

## 2019-06-11 DIAGNOSIS — Z7409 Other reduced mobility: Secondary | ICD-10-CM | POA: Diagnosis not present

## 2019-06-11 DIAGNOSIS — I4891 Unspecified atrial fibrillation: Secondary | ICD-10-CM | POA: Diagnosis not present

## 2019-06-11 DIAGNOSIS — I10 Essential (primary) hypertension: Secondary | ICD-10-CM | POA: Diagnosis not present

## 2019-06-20 DIAGNOSIS — M1991 Primary osteoarthritis, unspecified site: Secondary | ICD-10-CM | POA: Diagnosis not present

## 2019-06-20 DIAGNOSIS — I4891 Unspecified atrial fibrillation: Secondary | ICD-10-CM | POA: Diagnosis not present

## 2019-06-20 DIAGNOSIS — I129 Hypertensive chronic kidney disease with stage 1 through stage 4 chronic kidney disease, or unspecified chronic kidney disease: Secondary | ICD-10-CM | POA: Diagnosis not present

## 2019-06-20 DIAGNOSIS — N182 Chronic kidney disease, stage 2 (mild): Secondary | ICD-10-CM | POA: Diagnosis not present

## 2019-06-24 ENCOUNTER — Telehealth: Payer: Self-pay | Admitting: *Deleted

## 2019-06-24 NOTE — Telephone Encounter (Signed)
Patient's daughter called-patient has an appt on 3/17 for an ingrown toenail. Asking if he needs to stop the blood thinner prior to the procedure.

## 2019-06-24 NOTE — Telephone Encounter (Signed)
Returned call-  Informed her we do like to typically stop blood thinners 3 days prior to procedures of this nature, but she would need to verify this is safe for him to do with his cardiologist. She said she would check with them to make sure.

## 2019-07-01 ENCOUNTER — Ambulatory Visit: Payer: PPO | Admitting: Podiatry

## 2019-07-06 ENCOUNTER — Ambulatory Visit: Payer: PPO | Admitting: Urology

## 2019-07-07 ENCOUNTER — Ambulatory Visit: Payer: PPO | Admitting: Podiatry

## 2019-07-20 ENCOUNTER — Telehealth: Payer: Self-pay | Admitting: Cardiology

## 2019-07-20 NOTE — Telephone Encounter (Signed)
New message:     Daughter says pt need to have his toenails removed. She wants to know what does he need to do about his Xarelto please?

## 2019-07-20 NOTE — Telephone Encounter (Signed)
Follow up  Patient's daughter is returning call. Transferred call to [facility].

## 2019-07-20 NOTE — Telephone Encounter (Signed)
Spoke with patient's daughter. Daughter wants to know if Xarelto needs to be held for toenail removal.  Informed her if office removing toenails has a concern about medications a clearance form needs to be sent to the office for review. Daughter verbalized understanding.

## 2019-07-20 NOTE — Telephone Encounter (Signed)
Left message for patient or daughter to call back.

## 2019-07-21 DIAGNOSIS — M1991 Primary osteoarthritis, unspecified site: Secondary | ICD-10-CM | POA: Diagnosis not present

## 2019-07-21 DIAGNOSIS — N182 Chronic kidney disease, stage 2 (mild): Secondary | ICD-10-CM | POA: Diagnosis not present

## 2019-07-21 DIAGNOSIS — I4891 Unspecified atrial fibrillation: Secondary | ICD-10-CM | POA: Diagnosis not present

## 2019-07-21 DIAGNOSIS — I129 Hypertensive chronic kidney disease with stage 1 through stage 4 chronic kidney disease, or unspecified chronic kidney disease: Secondary | ICD-10-CM | POA: Diagnosis not present

## 2019-07-28 ENCOUNTER — Encounter: Payer: Self-pay | Admitting: Podiatry

## 2019-07-28 ENCOUNTER — Ambulatory Visit: Payer: PPO | Admitting: Podiatry

## 2019-07-28 ENCOUNTER — Other Ambulatory Visit: Payer: Self-pay

## 2019-07-28 VITALS — Temp 97.1°F

## 2019-07-28 DIAGNOSIS — M79676 Pain in unspecified toe(s): Secondary | ICD-10-CM | POA: Diagnosis not present

## 2019-07-28 DIAGNOSIS — Q828 Other specified congenital malformations of skin: Secondary | ICD-10-CM

## 2019-07-28 DIAGNOSIS — B351 Tinea unguium: Secondary | ICD-10-CM

## 2019-07-28 NOTE — Progress Notes (Signed)
Subjective:  Patient ID: Steven Boyd, male    DOB: Apr 30, 1940,  MRN: [id number] HPI Chief Complaint  Patient presents with  . Nail Problem    Nail trim and callous trim  . Callouses    79 y.o. male presents with the above complaint.   ROS: Denies fever chills nausea vomiting muscle aches pains calf pain back pain chest pain shortness of breath.  Past Medical History:  Diagnosis Date  . Diabetes mellitus without complication (HCC)    diet controlled  . GERD (gastroesophageal reflux disease)   . Hard of hearing    bilateral, no hearing aids  . Hypertension   . Prostate cancer [facility])    Past Surgical History:  Procedure Laterality Date  . BIOPSY  09/23/2016   Procedure: BIOPSY;  Surgeon: Rogene Houston, MD;  [facility];  Service: Endoscopy;;  gastric  . COLONOSCOPY N/A 05/08/2016   Procedure: COLONOSCOPY;  Surgeon: Rogene Houston, MD;  [facility];  Service: Endoscopy;  Laterality: N/A;  2:25-moved to 100 per Ann  . ESOPHAGOGASTRODUODENOSCOPY N/A 06/07/2016   Procedure: ESOPHAGOGASTRODUODENOSCOPY (EGD);  Surgeon: Rogene Houston, MD;  [facility];  Service: Endoscopy;  Laterality: N/A;  915  . ESOPHAGOGASTRODUODENOSCOPY N/A 09/23/2016   Procedure: ESOPHAGOGASTRODUODENOSCOPY (EGD);  Surgeon: Rogene Houston, MD;  [facility];  Service: Endoscopy;  Laterality: N/A;  955  . FEMUR IM NAIL Left 12/24/2014   Procedure: INTRAMEDULLARY (IM) NAIL FEMORAL;  Surgeon: Marchia Bond, MD;  Location: [place];  Service: Orthopedics;  Laterality: Left;  . POLYPECTOMY  05/08/2016   Procedure: POLYPECTOMY;  Surgeon: Rogene Houston, MD;  [facility];  Service: Endoscopy;;  sigmoid colon polypectomies x3     Current Outpatient Medications:  .  hydrALAZINE (APRESOLINE) 50 MG tablet, Take 1 tablet by mouth twice daily, Disp: 180 tablet, Rfl: 2 .  lisinopril (PRINIVIL,ZESTRIL) 40 MG tablet, Take 40 mg by mouth daily., Disp: , Rfl:  .   metoprolol succinate (TOPROL-XL) 25 MG 24 hr tablet, Take 0.5 tablets (12.5 mg total) by mouth daily. Take with or immediately following a meal., Disp: 90 tablet, Rfl: 3 .  Omega-3 Fatty Acids (FISH OIL) 1200 MG CAPS, Take 500 mg by mouth daily. , Disp: , Rfl:  .  rivaroxaban (XARELTO) 20 MG TABS tablet, Take 1 tablet (20 mg total) by mouth daily with supper. (Patient taking differently: Take 20 mg by mouth every evening. ), Disp: 28 tablet, Rfl: 0 .  tamsulosin (FLOMAX) 0.4 MG CAPS capsule, Take 0.4 mg by mouth every evening. , Disp: , Rfl:   Allergies  Allergen Reactions  . Protonix [Pantoprazole Sodium] Nausea And Vomiting    Patient states that after taking a few doses this happened , he stopped the medication.  . Dilaudid [Hydromorphone Hcl] Nausea And Vomiting   Review of Systems Objective:   Vitals:   07/28/19 1120  Temp: (!) 97.1 F (36.2 C)    General: Well developed, nourished, in no acute distress, alert and oriented x3   Dermatological: Skin is warm, dry and supple bilateral. Nails x 10 are well maintained; remaining integument appears unremarkable at this time. There are no open sores, no preulcerative lesions, no rash or signs of infection present.  Vascular: Dorsalis Pedis artery and Posterior Tibial artery pedal pulses are 2/4 bilateral with immedate capillary fill time. Pedal hair growth present. No varicosities and no lower extremity edema present bilateral.   Neruologic: Grossly intact via  light touch bilateral. Vibratory intact via tuning fork bilateral. Protective threshold with Semmes Wienstein monofilament intact to all pedal sites bilateral. Patellar and Achilles deep tendon reflexes 2+ bilateral. No Babinski or clonus noted bilateral.   Musculoskeletal: No gross boney pedal deformities bilateral. No pain, crepitus, or limitation noted with foot and ankle range of motion bilateral. Muscular strength 5/5 in all groups tested bilateral.  Gait: Unassisted,  Nonantalgic.    Radiographs:  None taken  Assessment & Plan:   Assessment: Nail dystrophy painful porokeratosis subfifth right  Plan: Sending letter of clearance to his cardiologist to stop his Xarelto to remove his toenails.  Once this is returned we will have him back in for nail avulsion and matrixectomy's.  Also I would go ahead today and debrided all reactive hyperkeratosis and debrided the toenails.     Shelby Anderle T. [place], [place]

## 2019-08-05 ENCOUNTER — Telehealth: Payer: Self-pay | Admitting: Cardiovascular Disease

## 2019-08-05 NOTE — Telephone Encounter (Addendum)
Patient with diagnosis of afib on Xarelto for anticoagulation.   Procedure:  Nail avulsion and matrixectomy  Date of procedure: TBD  CHADS2-VASc score of  5 (HTN, AGE, DM2, CAD/PAD, AGE)  CrCl 60 ml/min  Per office protocol, patient can hold Xarelto for 0-1 day prior to procedure.

## 2019-08-05 NOTE — Telephone Encounter (Signed)
   Primary Cardiologist: Minus Breeding, MD / Quay Burow, MD  Chart reviewed as part of pre-operative protocol coverage. Patient last seen by Dr. Gwenlyn Found in 01/2019 and was doing well from a cardiac standpoint at that time. Patient contacted today for further pre-op evaluation and denies any changes since last visit. No chest pain, shortness of breath, palpitations, lightheadedness, dizziness, syncope, orthopnea, or significant lower extremity edema. Able to complete >4.0 METS without any anginal symptoms. Given past medical history and time since last visit, based on ACC/AHA guidelines, Rahiem A Carll would be at acceptable risk for the planned procedure without further cardiovascular testing.   Per Pharmacy and office protocol, "patient can hold Xarelto for 0-1 day prior to procedure."  I will route this recommendation to the requesting party via Epic fax function and remove from pre-op pool.  Please call with questions.  Darreld Mclean, PA-C 08/05/2019, 1:19 PM

## 2019-08-05 NOTE — Telephone Encounter (Signed)
Pharmacy, can you please comment on how long patient can hold Xarelto for upcoming procedure?  Thank you! 

## 2019-08-05 NOTE — Telephone Encounter (Signed)
New Message        [facility] Pre-operative Risk Assessment    Request for surgical clearance:  1. What type of surgery is being performed? Nail avulsion and matrixectomy   2. When is this surgery scheduled? TBD on clearance   3. What type of clearance is required (medical clearance vs. Pharmacy clearance to hold med vs. Both)? Both   4. Are there any medications that need to be held prior to surgery and how long? Xarelto - need to know if he needs to hold and for how long   5. Practice name and name of physician performing surgery? Dr Bennie Pierini hyatt   6. What is your office phone number [phone]   7.   What is your office fax number [phone]  8.   Anesthesia type (None, local, MAC, general) ? None    Marca Ancona 08/05/2019, 9:32 AM  _________________________________________________________________   (provider comments below)

## 2019-08-15 ENCOUNTER — Encounter (HOSPITAL_COMMUNITY): Payer: Self-pay | Admitting: Emergency Medicine

## 2019-08-15 ENCOUNTER — Emergency Department (HOSPITAL_COMMUNITY)
Admission: EM | Admit: 2019-08-15 | Discharge: 2019-08-15 | Disposition: A | Discharge status: Home / Self Care | Payer: PPO | Attending: Emergency Medicine | Admitting: Emergency Medicine

## 2019-08-15 ENCOUNTER — Other Ambulatory Visit: Payer: Self-pay

## 2019-08-15 DIAGNOSIS — I10 Essential (primary) hypertension: Secondary | ICD-10-CM | POA: Insufficient documentation

## 2019-08-15 DIAGNOSIS — Z7901 Long term (current) use of anticoagulants: Secondary | ICD-10-CM | POA: Insufficient documentation

## 2019-08-15 DIAGNOSIS — F17210 Nicotine dependence, cigarettes, uncomplicated: Secondary | ICD-10-CM | POA: Diagnosis not present

## 2019-08-15 DIAGNOSIS — Z79899 Other long term (current) drug therapy: Secondary | ICD-10-CM | POA: Diagnosis not present

## 2019-08-15 DIAGNOSIS — Z8546 Personal history of malignant neoplasm of prostate: Secondary | ICD-10-CM | POA: Insufficient documentation

## 2019-08-15 DIAGNOSIS — E119 Type 2 diabetes mellitus without complications: Secondary | ICD-10-CM | POA: Insufficient documentation

## 2019-08-15 MED ORDER — HYDRALAZINE HCL 25 MG PO TABS
50.0000 mg | ORAL_TABLET | Freq: Once | ORAL | Status: AC
  Administered 2019-08-15: 50 mg via ORAL
  Filled 2019-08-15: qty 2

## 2019-08-15 NOTE — ED Triage Notes (Signed)
Pt states he checks his bp before he goes to bed and when he checked it tonight it was 201/102. Pt states he thinks his bp machine may be wrong but he just wanted to get his bp checked.

## 2019-08-15 NOTE — ED Provider Notes (Signed)
[facility] Provider Note   CSN: [id number] Arrival date & time: 08/15/19  0028     History Chief Complaint  Patient presents with  . Hypertension    Steven Boyd is a 79 y.o. male.  Patient presents to the emergency department for asymptomatic hypertension.  He has a history of essential hypertension, is on multiple meds.  No changes recently.  He usually checks his pressure in the morning before he takes his medication and then again in the afternoon.  He reports that his blood pressures normally around 150/90 in the morning and improves to 130s over 80s after he takes his meds.  He took his blood pressure tonight and it was [id number] systolic.  He denies headache, blurred vision, chest pain, shortness of breath.  No palpitations, dizziness, passing out.        Past Medical History:  Diagnosis Date  . Diabetes mellitus without complication (HCC)    diet controlled  . GERD (gastroesophageal reflux disease)   . Hard of hearing    bilateral, no hearing aids  . Hypertension   . Prostate cancer [facility])     Patient Active Problem List   Diagnosis Date Noted  . Atrial fibrillation with controlled ventricular rate ([place]) 05/26/2017  . Diet-controlled diabetes mellitus ([place]) 05/26/2017  . Buerger disease ([place]) 05/26/2017  . Tobacco use 05/26/2017  . History of Helicobacter infection 08/15/2016  . Chronic gastric ulcer 08/15/2016  . Abdominal pain, epigastric 05/29/2016  . Peripheral arterial disease ([place]) 05/29/2016  . Encounter for screening colonoscopy 03/18/2016  . Intertrochanteric fracture of left hip ([place]) 12/24/2014  . HTN (hypertension) 12/24/2014  . Angina pectoris ([place]) 02/28/2014    Past Surgical History:  Procedure Laterality Date  . BIOPSY  09/23/2016   Procedure: BIOPSY;  Surgeon: Rogene Houston, MD;  [facility];  Service: Endoscopy;;  gastric  . COLONOSCOPY N/A 05/08/2016   Procedure: COLONOSCOPY;  Surgeon: Rogene Houston,  MD;  [facility];  Service: Endoscopy;  Laterality: N/A;  2:25-moved to 100 per Ann  . ESOPHAGOGASTRODUODENOSCOPY N/A 06/07/2016   Procedure: ESOPHAGOGASTRODUODENOSCOPY (EGD);  Surgeon: Rogene Houston, MD;  [facility];  Service: Endoscopy;  Laterality: N/A;  915  . ESOPHAGOGASTRODUODENOSCOPY N/A 09/23/2016   Procedure: ESOPHAGOGASTRODUODENOSCOPY (EGD);  Surgeon: Rogene Houston, MD;  [facility];  Service: Endoscopy;  Laterality: N/A;  955  . FEMUR IM NAIL Left 12/24/2014   Procedure: INTRAMEDULLARY (IM) NAIL FEMORAL;  Surgeon: Marchia Bond, MD;  Location: [place];  Service: Orthopedics;  Laterality: Left;  . POLYPECTOMY  05/08/2016   Procedure: POLYPECTOMY;  Surgeon: Rogene Houston, MD;  [facility];  Service: Endoscopy;;  sigmoid colon polypectomies x3        Family History  Problem Relation Age of Onset  . Diabetes Mother   . Heart Problems Father        s/p heart valve repair 10 years before he passed away    Social History   Tobacco Use  . Smoking status: Current Every Day Smoker    Packs/day: 1.00    Years: 55.00    Pack years: 55.00    Types: Cigarettes  . Smokeless tobacco: Never Used  Substance Use Topics  . Alcohol use: No  . Drug use: No    Home Medications Prior to Admission medications   Medication Sig Start Date End Date Taking? Authorizing Provider  hydrALAZINE (APRESOLINE) 50 MG tablet Take 1 tablet  by mouth twice daily 05/24/19  Yes Lorretta Harp, MD  lisinopril (PRINIVIL,ZESTRIL) 40 MG tablet Take 40 mg by mouth daily. 05/16/18  Yes [provider]  Omega-3 Fatty Acids (FISH OIL) 1200 MG CAPS Take 500 mg by mouth daily.    Yes [provider]  rivaroxaban (XARELTO) 20 MG TABS tablet Take 1 tablet (20 mg total) by mouth daily with supper. Patient taking differently: Take 20 mg by mouth every evening.  09/24/16  Yes Rehman, Mechele Dawley, MD  tamsulosin (FLOMAX) 0.4 MG CAPS capsule Take 0.4 mg by mouth  every evening.    Yes [provider]  metoprolol succinate (TOPROL-XL) 25 MG 24 hr tablet Take 0.5 tablets (12.5 mg total) by mouth daily. Take with or immediately following a meal. 06/03/18 01/27/19  Lorretta Harp, MD    Allergies    Protonix [pantoprazole sodium] and Dilaudid [hydromorphone hcl]  Review of Systems   Review of Systems  Respiratory: Negative for shortness of breath.   Cardiovascular: Negative for chest pain.  All other systems reviewed and are negative.   Physical Exam Updated Vital Signs BP (!) 168/84   Pulse (!) 48   Temp 97.8 F (36.6 C) (Oral)   Resp 19   Ht 6\' 2"  (1.88 m)   Wt 108.9 kg   SpO2 96%   BMI 30.81 kg/m   Physical Exam Vitals and nursing note reviewed.  Constitutional:      General: He is not in acute distress.    Appearance: Normal appearance. He is well-developed.  HENT:     Head: Normocephalic and atraumatic.     Right Ear: Hearing normal.     Left Ear: Hearing normal.     Nose: Nose normal.  Eyes:     Conjunctiva/sclera: Conjunctivae normal.     Pupils: Pupils are equal, round, and reactive to light.  Cardiovascular:     Rate and Rhythm: Regular rhythm.     Heart sounds: S1 normal and S2 normal. No murmur. No friction rub. No gallop.   Pulmonary:     Effort: Pulmonary effort is normal. No respiratory distress.     Breath sounds: Normal breath sounds.  Chest:     Chest wall: No tenderness.  Abdominal:     General: Bowel sounds are normal.     Palpations: Abdomen is soft.     Tenderness: There is no abdominal tenderness. There is no guarding or rebound. Negative signs include Murphy's sign and McBurney's sign.     Hernia: No hernia is present.  Musculoskeletal:        General: Normal range of motion.     Cervical back: Normal range of motion and neck supple.  Skin:    General: Skin is warm and dry.     Findings: No rash.  Neurological:     Mental Status: He is alert and oriented to person, place, and time.      GCS: GCS eye subscore is 4. GCS verbal subscore is 5. GCS motor subscore is 6.     Cranial Nerves: No cranial nerve deficit.     Sensory: No sensory deficit.     Coordination: Coordination normal.  Psychiatric:        Speech: Speech normal.        Behavior: Behavior normal.        Thought Content: Thought content normal.     ED Results / Procedures / Treatments   Labs (all labs ordered are listed, but only abnormal  results are displayed) Labs Reviewed - No data to display  EKG None  Radiology No results found.  Procedures Procedures (including critical care time)  Medications Ordered in ED Medications  hydrALAZINE (APRESOLINE) tablet 50 mg (50 mg Oral Given 08/15/19 0143)    ED Course  I have reviewed the triage vital signs and the nursing notes.  Pertinent labs & imaging results that were available during my care of the patient were reviewed by me and considered in my medical decision making (see chart for details).    MDM Rules/Calculators/A&P                      Patient with asymptomatic hypertension.  He has a history of essential hypertension but his blood pressure tonight is higher than it normally is.  He has had outpatient blood work in the recent months, does not require repeat blood work Midwife.  He is not experiencing any chest pain or cardiac symptoms.  This is completely asymptomatic.  Blood pressure improved after additional hydralazine.  I will recommend he double his hydralazine dose and follow-up with either his cardiologist or PCP.  Final Clinical Impression(s) / ED Diagnoses Final diagnoses:  Essential hypertension    Rx / DC Orders ED Discharge Orders    None       Natalio Salois, Gwenyth Allegra, MD 08/15/19 [phone]

## 2019-08-15 NOTE — Discharge Instructions (Signed)
Double your hydralazine dose in the mornings.  Continue to check your blood pressure like you normally do.  Please schedule follow-up for repeat blood pressure check with your primary care doctor or your cardiologist.

## 2019-08-17 ENCOUNTER — Other Ambulatory Visit: Payer: Self-pay | Admitting: Cardiovascular Disease

## 2019-08-17 ENCOUNTER — Telehealth: Payer: Self-pay | Admitting: *Deleted

## 2019-08-17 NOTE — Telephone Encounter (Signed)
-----   Message from Mack Hook, LPN sent at 075-GRM 11:49 AM EDT ----- Will you please call patient and schedule him with Dr. Milinda Pointer in the next several weeks for a nail removal procedure?  We were able to get medical clearance from his cardiologist (in his chart) and he can now be scheduled.   Thanks!

## 2019-08-17 NOTE — Telephone Encounter (Signed)
"  Someone called my dad and left a message for him to call.  I'm calling for him.  He has a hard time hearing.  He could not tell me what the call was about."  I called to let him know that we received medical clearance from his doctor.  He needs to schedule an appointment with Dr. Milinda Pointer.  "Let me call my dad and see what works for him and make some arrangements to get him there and I'll call you back."

## 2019-08-17 NOTE — Telephone Encounter (Signed)
I attempted to call the patient to schedule him an appointment with Dr. Milinda Pointer.  I left him a message to call me back.

## 2019-08-17 NOTE — Telephone Encounter (Signed)
"  I spoke to my dad.  He said his toes don't bother him right now.

## 2019-08-20 ENCOUNTER — Other Ambulatory Visit: Payer: Self-pay

## 2019-08-20 DIAGNOSIS — N182 Chronic kidney disease, stage 2 (mild): Secondary | ICD-10-CM | POA: Diagnosis not present

## 2019-08-20 DIAGNOSIS — M1991 Primary osteoarthritis, unspecified site: Secondary | ICD-10-CM | POA: Diagnosis not present

## 2019-08-20 DIAGNOSIS — I129 Hypertensive chronic kidney disease with stage 1 through stage 4 chronic kidney disease, or unspecified chronic kidney disease: Secondary | ICD-10-CM | POA: Diagnosis not present

## 2019-08-20 DIAGNOSIS — I4891 Unspecified atrial fibrillation: Secondary | ICD-10-CM | POA: Diagnosis not present

## 2019-10-20 DIAGNOSIS — M1991 Primary osteoarthritis, unspecified site: Secondary | ICD-10-CM | POA: Diagnosis not present

## 2019-10-20 DIAGNOSIS — I4891 Unspecified atrial fibrillation: Secondary | ICD-10-CM | POA: Diagnosis not present

## 2019-10-20 DIAGNOSIS — I129 Hypertensive chronic kidney disease with stage 1 through stage 4 chronic kidney disease, or unspecified chronic kidney disease: Secondary | ICD-10-CM | POA: Diagnosis not present

## 2019-10-20 DIAGNOSIS — N182 Chronic kidney disease, stage 2 (mild): Secondary | ICD-10-CM | POA: Diagnosis not present

## 2019-11-15 ENCOUNTER — Other Ambulatory Visit: Payer: Self-pay | Admitting: Cardiovascular Disease

## 2019-11-19 DIAGNOSIS — I129 Hypertensive chronic kidney disease with stage 1 through stage 4 chronic kidney disease, or unspecified chronic kidney disease: Secondary | ICD-10-CM | POA: Diagnosis not present

## 2019-11-19 DIAGNOSIS — M1991 Primary osteoarthritis, unspecified site: Secondary | ICD-10-CM | POA: Diagnosis not present

## 2019-11-19 DIAGNOSIS — N182 Chronic kidney disease, stage 2 (mild): Secondary | ICD-10-CM | POA: Diagnosis not present

## 2019-11-19 DIAGNOSIS — I4891 Unspecified atrial fibrillation: Secondary | ICD-10-CM | POA: Diagnosis not present

## 2019-12-10 DIAGNOSIS — E663 Overweight: Secondary | ICD-10-CM | POA: Diagnosis not present

## 2019-12-10 DIAGNOSIS — R413 Other amnesia: Secondary | ICD-10-CM | POA: Diagnosis not present

## 2019-12-10 DIAGNOSIS — Z6829 Body mass index (BMI) 29.0-29.9, adult: Secondary | ICD-10-CM | POA: Diagnosis not present

## 2019-12-10 DIAGNOSIS — I10 Essential (primary) hypertension: Secondary | ICD-10-CM | POA: Diagnosis not present

## 2019-12-10 DIAGNOSIS — E7849 Other hyperlipidemia: Secondary | ICD-10-CM | POA: Diagnosis not present

## 2019-12-10 DIAGNOSIS — Z0001 Encounter for general adult medical examination with abnormal findings: Secondary | ICD-10-CM | POA: Diagnosis not present

## 2019-12-10 DIAGNOSIS — E1140 Type 2 diabetes mellitus with diabetic neuropathy, unspecified: Secondary | ICD-10-CM | POA: Diagnosis not present

## 2019-12-10 DIAGNOSIS — Z1389 Encounter for screening for other disorder: Secondary | ICD-10-CM | POA: Diagnosis not present

## 2019-12-10 DIAGNOSIS — E119 Type 2 diabetes mellitus without complications: Secondary | ICD-10-CM | POA: Diagnosis not present

## 2019-12-21 DIAGNOSIS — I129 Hypertensive chronic kidney disease with stage 1 through stage 4 chronic kidney disease, or unspecified chronic kidney disease: Secondary | ICD-10-CM | POA: Diagnosis not present

## 2019-12-21 DIAGNOSIS — I4891 Unspecified atrial fibrillation: Secondary | ICD-10-CM | POA: Diagnosis not present

## 2019-12-21 DIAGNOSIS — M1991 Primary osteoarthritis, unspecified site: Secondary | ICD-10-CM | POA: Diagnosis not present

## 2019-12-21 DIAGNOSIS — N182 Chronic kidney disease, stage 2 (mild): Secondary | ICD-10-CM | POA: Diagnosis not present

## 2020-01-04 ENCOUNTER — Telehealth: Payer: Self-pay

## 2020-01-04 NOTE — Telephone Encounter (Signed)
Daughter called. Pt recent PSA from PCP is 8.3  Will obtain records from Dr. Gerarda Fraction office. Pt scheduled to see MD next Tuesday.

## 2020-01-11 ENCOUNTER — Other Ambulatory Visit: Payer: Self-pay

## 2020-01-11 ENCOUNTER — Ambulatory Visit (INDEPENDENT_AMBULATORY_CARE_PROVIDER_SITE_OTHER): Payer: PPO | Admitting: Urology

## 2020-01-11 ENCOUNTER — Encounter: Payer: Self-pay | Admitting: Urology

## 2020-01-11 VITALS — BP 154/81 | HR 53 | Temp 98.3°F | Ht 74.0 in | Wt 220.0 lb

## 2020-01-11 DIAGNOSIS — C61 Malignant neoplasm of prostate: Secondary | ICD-10-CM | POA: Diagnosis not present

## 2020-01-11 LAB — URINALYSIS, ROUTINE W REFLEX MICROSCOPIC
Bilirubin, UA: NEGATIVE
Glucose, UA: NEGATIVE
Ketones, UA: NEGATIVE
Leukocytes,UA: NEGATIVE
Nitrite, UA: NEGATIVE
Protein,UA: NEGATIVE
RBC, UA: NEGATIVE
Specific Gravity, UA: 1.015 (ref 1.005–1.030)
Urobilinogen, Ur: 0.2 mg/dL (ref 0.2–1.0)
pH, UA: 6 (ref 5.0–7.5)

## 2020-01-11 MED ORDER — LEVOFLOXACIN 750 MG PO TABS: 750.0000 mg | ORAL_TABLET | Freq: Once | ORAL | 0 refills | Status: AC

## 2020-01-11 MED ORDER — LEVOFLOXACIN 750 MG PO TABS: 750.0000 mg | ORAL_TABLET | Freq: Once | ORAL | 0 refills | Status: DC

## 2020-01-11 NOTE — Patient Instructions (Addendum)
Fusion Biopsy instructions for [facility]  Antibiotics: Take Levaquin 750mg  by mouth the morning of your procedure  Take antibiotics as written above unless otherwise directed.  Other:  1. Eat NORMAL meals  2. Purchase a Fleet's Enema over-the-counter and administer two hours before your procedure.  ALL PATIENTS ON ASPIRIN  Include but not limited to Aggrenox: STOP the medication five (5) days prior to procedure  ALL PATIENTS ON NSAIDS Includes but no limited to Advil, Ibuprofen,Mortin and Aleve: STOP the medication for two (2) days prior to procedure  ALL PATIENTS OF COUMADIN STOP the medication for five (5) days prior to procedure. A protime level/INR blood test may be drawn the day before or day of your procedure to make sure that is it safe to proceed with procedure.  ALL PATIENTS ON XARELTO STOP the medication three (3) days prior to the procedure.   ALL PATIENTS ON PRADAXA Stop the medication for three (3) days prior to the procedure   ALL PATIENTS on ELIQUIS STOP the medication for three (3) days prior to the procedure.   ALL PATIENTS on BRILINTA STOP the medication for three (3) days prior to the procedure   ALL PATIENTS ON ELMIRON STOP the medication for three (3) days prior to the procedure   ALL PATIENTS ON VITAMINS AND HERBAL SUPPLEMENTS: STOP all for seven (7) days prior to procedure.   AFTER Biopsy Care: Take is easy for a couple of days. No heavy lifting or strenuous activity You may expect to see some blood in your urine and bowel movements for a couple of days. This should subside in a few days. You may also experience some blood in the semen, causing the sem to be rust colored, for up to month. Call our office if you develop a fever of 101 degrees or greater, are unable to urinate, or if the bleeding does not subside.

## 2020-01-11 NOTE — Progress Notes (Signed)
H&P  Chief Complaint: Prostate Cancer  History of Present Illness:   9.21.2021: Most recent PSA 8.3 Pt denies any prostate related symptomatology at this time. Pt does note tolerable urinary frequency and urgency. His last visit was 2 years ago.  IPSS Questionnaire (AUA-7): Over the past month.   1)  How often have you had a sensation of not emptying your bladder completely after you finish urinating?  0 - Not at all  2)  How often have you had to urinate again less than two hours after you finished urinating? 3 - About half the time  3)  How often have you found you stopped and started again several times when you urinated?  0 - Not at all  4) How difficult have you found it to postpone urination?  0 - Not at all  5) How often have you had a weak urinary stream?  0 - Not at all  6) How often have you had to push or strain to begin urination?  0 - Not at all  7) How many times did you most typically get up to urinate from the time you went to bed until the time you got up in the morning?  2 - 2 times  Total score:  0-7 mildly symptomatic   8-19 moderately symptomatic   20-35 severely symptomatic  QOL Score: 2  (below copied from [facility] records):   He underwent ultrasound and biopsy of his prostate on 07/25/2015. At that time, PSA was rising and was 5.8. Prostatic volume was 117 cc. PSA density 0.05.  2/12 cores positive for adenocarcinoma (right base medial, right mid medial), both GS 3+3  both revealed 20% of the cores involved with cancer.  IPSS-10, quality of life. 2(initial)   Following consultation, the patient decided on active surveillance.   His Oncotype DX results areas follows:  GPS score 15  Likelihood of favorable pathology--92%  Likelihood of low-grade disease--95%  Likelihood of organ confined disease--96%.  SHIM- 1   07/23/2016: PSA is stable at 3.6   01/13/2017--PSA 4.4 He does have frequency and urgency and nocturia 2. He does drink sodas at night.   06/23/2017:  MRI of the prostate performed. This revealed a single focus of signal abnormality at the right base, PI-RADS 2 lesion.prostatic volume was measured at 87 mL. There was no evidence of trans-capsular spread, seminal vesicle involvement, neurovascular bundle involvement, pelvic adenopathy or bony metastatic process.   4.2.2019: He presents for surveillance biopsy. Because of the absence of aggressive lesions on MRI, fusion biopsy is not performed. Prostatic volume was 96 mL. 3/12 cores revealed adenocarcinoma: Right base lateral, GS 3+3 ( 5% of core), right base medial GS 3+3 (40% of core), left base lateral GS 3+3 (10% of core).   10.8.2019: No urinary complaints over the past 6 months. No gross hematuria or dysuria. Most recent PSA 4.7.   Past Medical History:  Diagnosis Date  . Diabetes mellitus without complication (HCC)    diet controlled  . GERD (gastroesophageal reflux disease)   . Hard of hearing    bilateral, no hearing aids  . Hypertension   . Prostate cancer [facility])     Past Surgical History:  Procedure Laterality Date  . BIOPSY  09/23/2016   Procedure: BIOPSY;  Surgeon: Rogene Houston, MD;  [facility];  Service: Endoscopy;;  gastric  . COLONOSCOPY N/A 05/08/2016   Procedure: COLONOSCOPY;  Surgeon: Rogene Houston, MD;  [facility];  Service:  Endoscopy;  Laterality: N/A;  2:25-moved to 100 per Ann  . ESOPHAGOGASTRODUODENOSCOPY N/A 06/07/2016   Procedure: ESOPHAGOGASTRODUODENOSCOPY (EGD);  Surgeon: Rogene Houston, MD;  [facility];  Service: Endoscopy;  Laterality: N/A;  915  . ESOPHAGOGASTRODUODENOSCOPY N/A 09/23/2016   Procedure: ESOPHAGOGASTRODUODENOSCOPY (EGD);  Surgeon: Rogene Houston, MD;  [facility];  Service: Endoscopy;  Laterality: N/A;  955  . FEMUR IM NAIL Left 12/24/2014   Procedure: INTRAMEDULLARY (IM) NAIL FEMORAL;  Surgeon: Marchia Bond, MD;  Location: [place];  Service: Orthopedics;  Laterality: Left;  . POLYPECTOMY   05/08/2016   Procedure: POLYPECTOMY;  Surgeon: Rogene Houston, MD;  [facility];  Service: Endoscopy;;  sigmoid colon polypectomies x3     Home Medications:  Allergies as of 01/11/2020      Reactions   Protonix [pantoprazole Sodium] Nausea And Vomiting   Patient states that after taking a few doses this happened , he stopped the medication.   Dilaudid [hydromorphone Hcl] Nausea And Vomiting      Medication List       Accurate as of January 11, 2020  1:11 PM. If you have any questions, ask your nurse or doctor.        Fish Oil 1200 MG Caps Take 500 mg by mouth daily.   hydrALAZINE 50 MG tablet Commonly known as: APRESOLINE Take 1 tablet by mouth twice daily   lisinopril 40 MG tablet Commonly known as: ZESTRIL Take 40 mg by mouth daily.   metoprolol succinate 25 MG 24 hr tablet Commonly known as: TOPROL-XL TAKE 1/2 (ONE-HALF) TABLET BY MOUTH ONCE DAILY WITH  OR  IMMEDIATELY  FOLLOWING  A  MEAL   rivaroxaban 20 MG Tabs tablet Commonly known as: Xarelto Take 1 tablet (20 mg total) by mouth daily with supper. What changed: when to take this   tamsulosin 0.4 MG Caps capsule Commonly known as: FLOMAX Take 0.4 mg by mouth every evening.       Allergies:  Allergies  Allergen Reactions  . Protonix [Pantoprazole Sodium] Nausea And Vomiting    Patient states that after taking a few doses this happened , he stopped the medication.  . Dilaudid [Hydromorphone Hcl] Nausea And Vomiting    Family History  Problem Relation Age of Onset  . Diabetes Mother   . Heart Problems Father        s/p heart valve repair 10 years before he passed away    Social History:  reports that he has been smoking cigarettes. He has a 55.00 pack-year smoking history. He has never used smokeless tobacco. He reports that he does not drink alcohol and does not use drugs.  ROS: A complete review of systems was performed.  All systems are negative except for pertinent findings as  noted.  Physical Exam:  Vital signs in last 24 hours: There were no vitals taken for this visit. Constitutional:  Alert and oriented, No acute distress Cardiovascular: Regular rate  Respiratory: Normal respiratory effort GI: Abdomen is soft, nontender, nondistended, no abdominal masses. No CVAT.  Genitourinary: Normal male phallus, testes are descended bilaterally and non-tender Hydrocele, scrotum is normal in appearance without lesions or masses, perineum is normal on inspection. Prostate feels about 120 grams Neurologic: Grossly intact, no focal deficits Psychiatric: Normal mood and affect  I have reviewed prior pt notes  I have reviewed notes from referring/previous physicians  I have reviewed urinalysis/pathology+results  I have independently reviewed prior imaging  I have reviewed prior  PSA results  Impression/Assessment:  Prostate cancer, low risk, low volume.  On active surveillance but the patient has not been seen here in about 2 years.  There is a modest PSA increase but he does have a very large gland.  Last surveillance biopsy was about 2-1/2 years ago.  Plan:  I think we should proceed with MRI and repeat fusion biopsy.  I discussed this with the patient and his wife.  We will get this scheduled in the near future.  CC: Dr. Redmond School

## 2020-01-11 NOTE — Progress Notes (Signed)
Urological Symptom Review  Patient is experiencing the following symptoms: Frequent urination Get up at night to urinate   Review of Systems  Gastrointestinal (upper)  : Negative for upper GI symptoms  Gastrointestinal (lower) : Negative for lower GI symptoms  Constitutional : Negative for symptoms  Skin: Negative for skin symptoms  Eyes: Negative for eye symptoms  Ear/Nose/Throat : Negative for Ear/Nose/Throat symptoms  Hematologic/Lymphatic: Negative for Hematologic/Lymphatic symptoms  Cardiovascular : Negative for cardiovascular symptoms  Respiratory : Negative for respiratory symptoms  Endocrine: Negative for endocrine symptoms  Musculoskeletal: Back pain Joint pain  Neurological: Negative for neurological symptoms  Psychologic: Negative for psychiatric symptoms  

## 2020-01-20 ENCOUNTER — Telehealth: Payer: Self-pay | Admitting: Cardiovascular Disease

## 2020-01-20 ENCOUNTER — Telehealth: Payer: Self-pay

## 2020-01-20 DIAGNOSIS — M1991 Primary osteoarthritis, unspecified site: Secondary | ICD-10-CM | POA: Diagnosis not present

## 2020-01-20 DIAGNOSIS — N182 Chronic kidney disease, stage 2 (mild): Secondary | ICD-10-CM | POA: Diagnosis not present

## 2020-01-20 DIAGNOSIS — I129 Hypertensive chronic kidney disease with stage 1 through stage 4 chronic kidney disease, or unspecified chronic kidney disease: Secondary | ICD-10-CM | POA: Diagnosis not present

## 2020-01-20 DIAGNOSIS — I4891 Unspecified atrial fibrillation: Secondary | ICD-10-CM | POA: Diagnosis not present

## 2020-01-20 NOTE — Telephone Encounter (Signed)
   [facility] Pre-operative Risk Assessment    Request for surgical clearance:  1. What type of surgery is being performed? FUSION PROSTATE BIOPSY    2. When is this surgery scheduled? TBD (NOVEMBER)   3. What type of clearance is required (medical clearance vs. Pharmacy clearance to hold med vs. Both)? PHARMACY  4. Are there any medications that need to be held prior to surgery and how long? XARELTO-3 DAYS   5. Practice name and name of physician performing surgery? [facility] ATTN:AMANDA   6. What is the office phone number? [phone]   7.   What is the office fax number? [phone]  8.   Anesthesia type (None, local, MAC, general) ? NOT LISTED-[id number]

## 2020-01-20 NOTE — Telephone Encounter (Signed)
Pt c/o medication issue:  1. Name of Medication:rivaroxaban (XARELTO) 20 MG TABS tablet  2. How are you currently taking this medication (dosage and times per day)? Not currently taking  3. Are you having a reaction (difficulty breathing--STAT)? no  4. What is your medication issue? Patient's daughter states the patient is not taking xarelto anymore, because it's expensive and is taking Asprin instead. She wanted to inform he has been off the medication for a couple weeks.

## 2020-01-20 NOTE — Telephone Encounter (Signed)
Follow Up:    Daughter called back and wanted you to know that the pt is taking three 81 mg of Aspirin per day.

## 2020-01-20 NOTE — Telephone Encounter (Signed)
   Primary Cardiologist: Minus Breeding, MD is listed in chart, but patient does not have any recent OVs with him - only Dr. Quay Burow.  Chart reviewed as part of pre-operative protocol coverage. Because of Steven Boyd's past medical history and time since last visit, they will require a follow-up visit in order to better assess preoperative cardiovascular risk.  Last OV was in 01/2019 therefore will be over 1 year since seen by the time that surgery is planned in November.   Pre-op covering staff: - Please schedule appointment and call patient to inform them. (Do not see any pending appointments already scheduled.) - Please contact requesting surgeon's office via preferred method (i.e, phone, fax) to inform them of need for appointment prior to surgery.  While awaiting appointment, will route this message to pharmacy team for input on holding anticoagulation as requested. [facility] labs from 12/10/19 Larene Pickett showed Cr 1.13 and Hgb 15.4.  Steven Pitter, PA-C  01/20/2020, 3:15 PM

## 2020-01-20 NOTE — Telephone Encounter (Signed)
Agree 

## 2020-01-20 NOTE — Telephone Encounter (Signed)
Returned call to dtr, dpr on file. Informed that ASA will not be enough to help prevent pt from stroke (d/t hx AFib), no matter if he takes 1 or 3.  She understands this and called when she found out this morning that he stopped taking it weeks ago d/t cost. Advised that office is leaving 2 weeks of Xarelto samples for them to pick up today.  Advised to restart medication, stop the ASA.   [employer] medication cost assistance phone number given to dtr.  Advised to call to see if they can get assistance while pt is most likely in the doughnut hole. She will call us back if there are any issues with getting assistance. Will forward to cardiologist for his FYI.

## 2020-01-20 NOTE — Telephone Encounter (Addendum)
Patient with diagnosis of A Fib on Xarelto for anticoagulation.  Of note, daughter reports patient has been off Xarelto for "weeks" due to cost and is supposed to pick up samples today  Procedure: FUSION PROSTATE BIOPSY   Date of procedure: TBD (NOVEMBER)   CHADS2-VASc score of  5 (HTN, AGE x 2, DM2, CAD)  If patient restarts Xarelto, can hold for 2-3 days before procedure

## 2020-01-20 NOTE — Telephone Encounter (Signed)
DPR ok to s/w pt's daughter [name]. [name] has been made aware pt will need an appt for pre op clearance. Pt has been scheduled to see Dr. Percival Spanish 02/18/20 @ 12 pm. Pt's daughter thanked me for the call and the help. I will forward clearance notes to MD for upcoming appt. I will remove from the pre op call back. Will send FYI to requesting office pt has appt 02/18/20 with his cardiologist for pre op assessment.

## 2020-01-21 NOTE — Telephone Encounter (Signed)
I will defer cardiac evaluation/clearance to Dr. Percival Spanish at his 02/18/2020 follow-up visit.  We will remove him from the preop pool.

## 2020-01-21 NOTE — Telephone Encounter (Signed)
Steven Boyd is calling stating the patient will be under local anesthesia.

## 2020-02-01 ENCOUNTER — Telehealth: Payer: Self-pay | Admitting: *Deleted

## 2020-02-01 NOTE — Telephone Encounter (Signed)
Two sample bottles of Xarelto 20 mg placed at front desk for pick up

## 2020-02-02 DIAGNOSIS — Z23 Encounter for immunization: Secondary | ICD-10-CM | POA: Diagnosis not present

## 2020-02-04 ENCOUNTER — Ambulatory Visit (HOSPITAL_COMMUNITY)
Admission: RE | Admit: 2020-02-04 | Discharge: 2020-02-04 | Disposition: A | Discharge status: Home / Self Care | Payer: PPO | Source: Ambulatory Visit | Attending: Urology | Admitting: Urology

## 2020-02-04 ENCOUNTER — Other Ambulatory Visit: Payer: Self-pay

## 2020-02-04 DIAGNOSIS — C61 Malignant neoplasm of prostate: Secondary | ICD-10-CM | POA: Diagnosis not present

## 2020-02-04 DIAGNOSIS — R590 Localized enlarged lymph nodes: Secondary | ICD-10-CM | POA: Diagnosis not present

## 2020-02-04 DIAGNOSIS — K4090 Unilateral inguinal hernia, without obstruction or gangrene, not specified as recurrent: Secondary | ICD-10-CM | POA: Diagnosis not present

## 2020-02-04 MED ORDER — GADOBUTROL 1 MMOL/ML IV SOLN
10.0000 mL | Freq: Once | INTRAVENOUS | Status: AC | PRN
  Administered 2020-02-04: 10 mL via INTRAVENOUS

## 2020-02-08 ENCOUNTER — Telehealth: Payer: Self-pay

## 2020-02-08 NOTE — Telephone Encounter (Signed)
-----   Message from Franchot Gallo, MD sent at 02/08/2020  8:50 AM EDT ----- Notify pt--MRI showed 1 area that we will target w/ biopsy in [place]--nothing looks suspicious outside of prostate ----- Message ----- From: Valentina Lucks, LPN Sent: 03/54/6568   2:48 PM EDT To: Franchot Gallo, MD  Pls review.

## 2020-02-08 NOTE — Telephone Encounter (Signed)
Daughter notified of results.

## 2020-02-10 ENCOUNTER — Other Ambulatory Visit: Payer: Self-pay | Admitting: Cardiovascular Disease

## 2020-02-13 IMAGING — CT CT HEAD W/O CM
3 series · 15 of 47 positions shown, 18 images · non-contrast
Comparison: Head CT dated 06/25/2016.

CLINICAL DATA: Near syncope, diaphoresis vision changes. Altered
level of consciousness, unexplained.

EXAM:
CT HEAD WITHOUT CONTRAST
TECHNIQUE: Contiguous axial images were obtained from the base of the skull
through the vertex without intravenous contrast.

[Series 2: head trauma wo · axial · 0.44mm/px · z∈[+1463,+1593]mm · 9 of 32 slices shown, 12 images]
[im 3/32  brain]
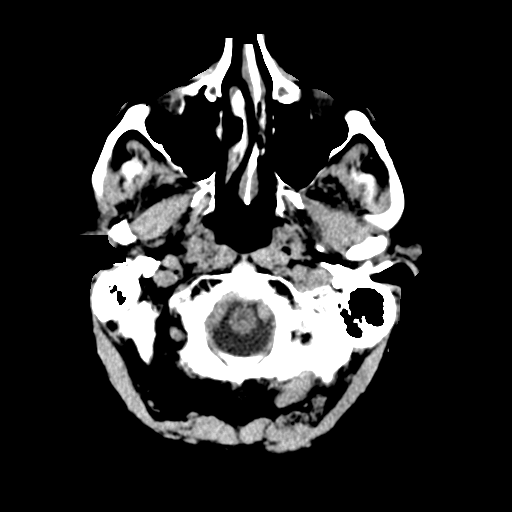
[im 3/32  bone]
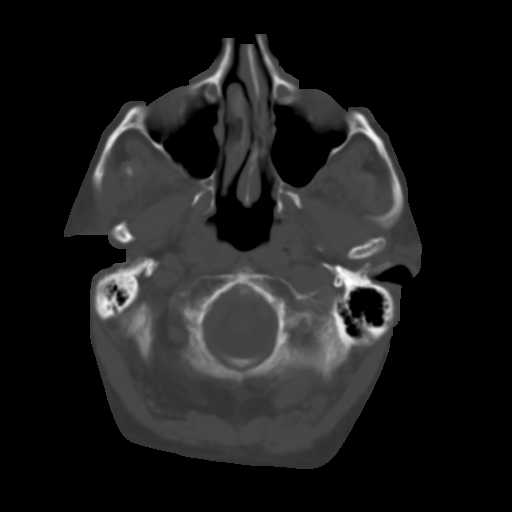
[im 6/32  brain]
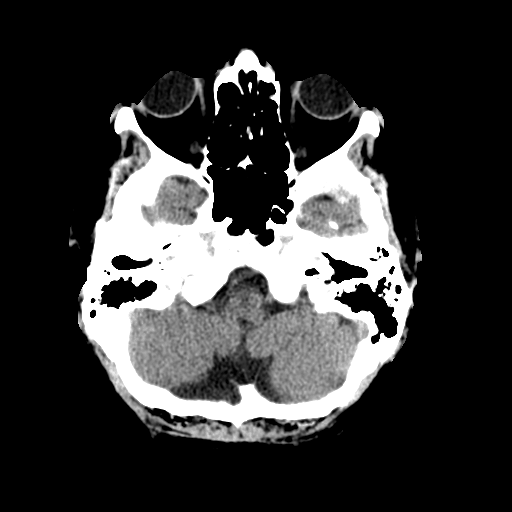
[im 9/32  brain]
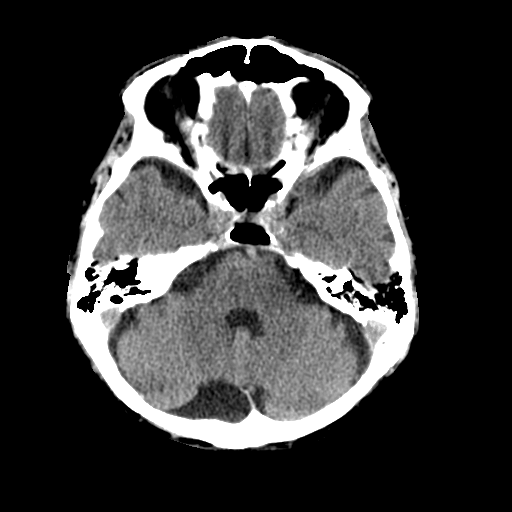
[im 12/32  brain]
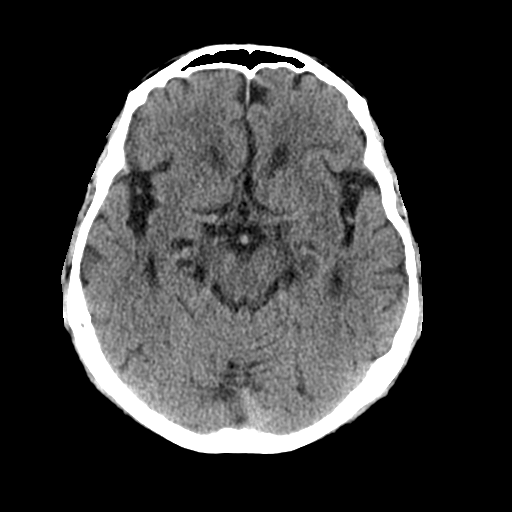
[im 17/32  brain]
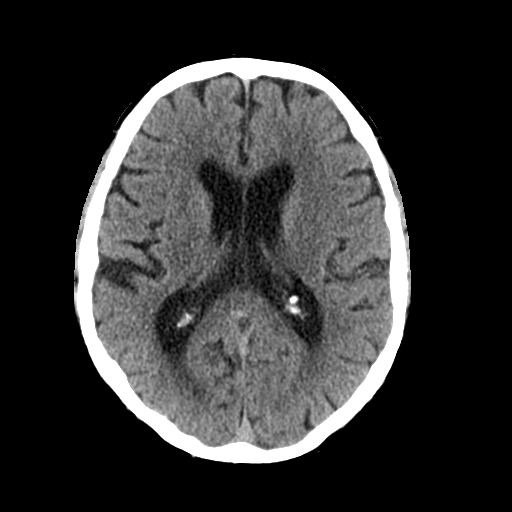
[im 17/32  bone]
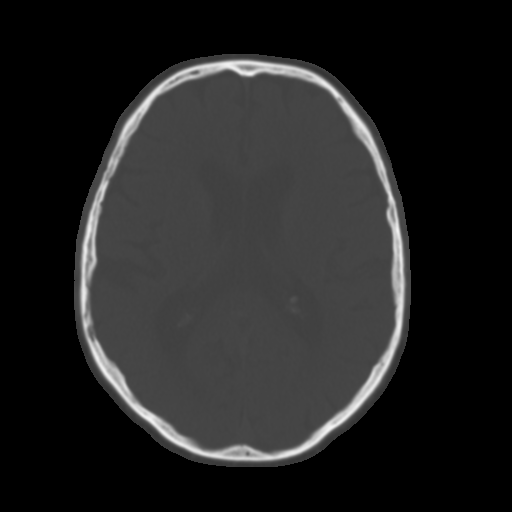
[im 20/32  brain]
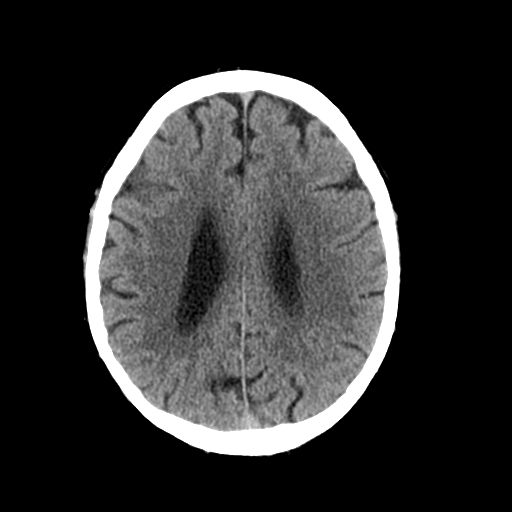
[im 23/32  brain]
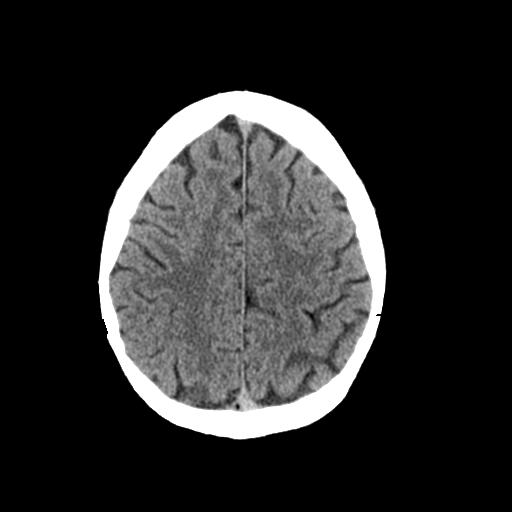
[im 26/32  brain]
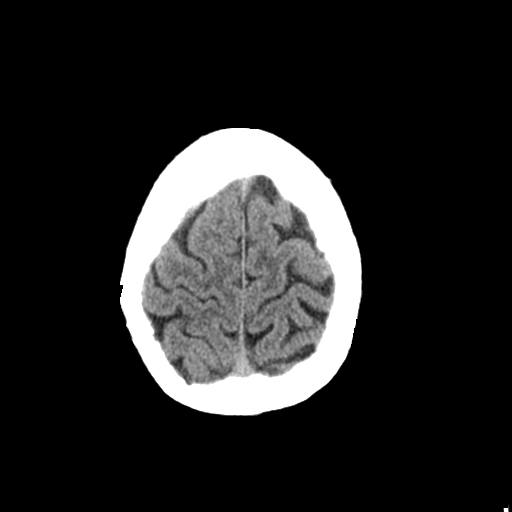
[im 29/32  brain]
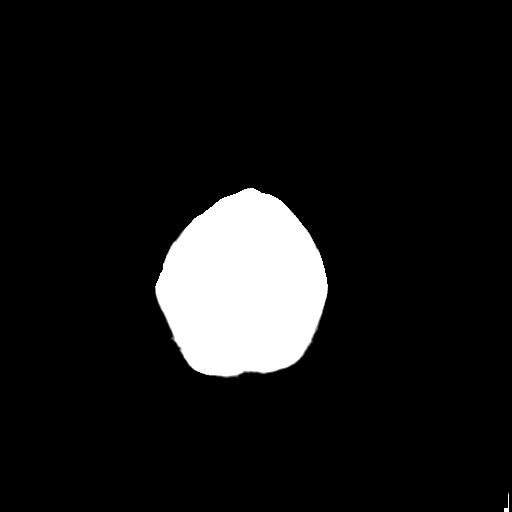
[im 29/32  bone]
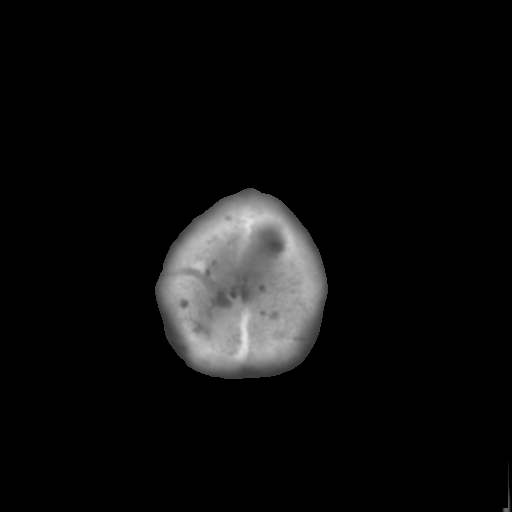

[Series 4: coronal soft tissue · coronal · 0.31mm/px · 3 of 71 slices shown]
[im 24/71  brain]
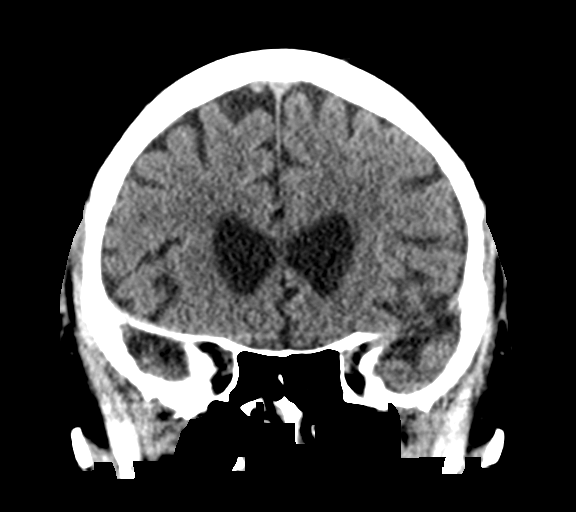
[im 32/71  brain]
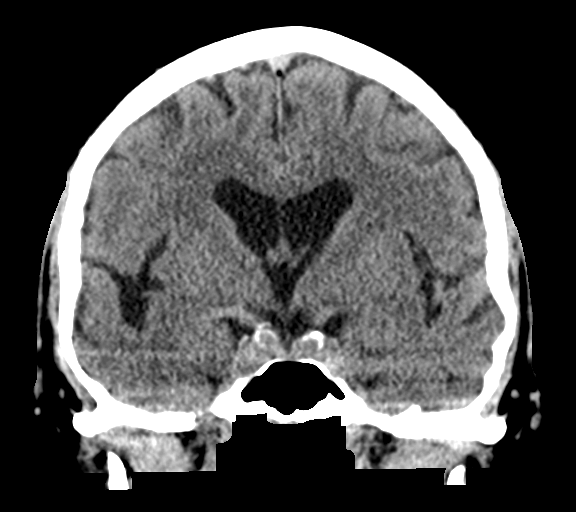
[im 39/71  brain]
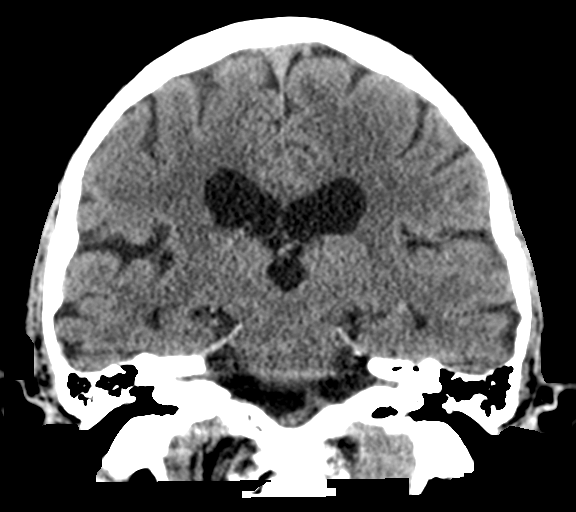

[Series 5: sagittal soft tissue · sagittal · 0.32mm/px · 3 of 60 slices shown]
[im 20/60  brain]
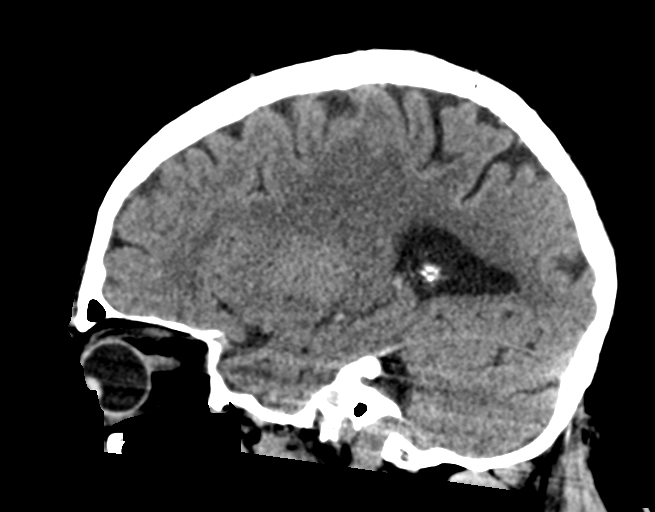
[im 30/60  brain]
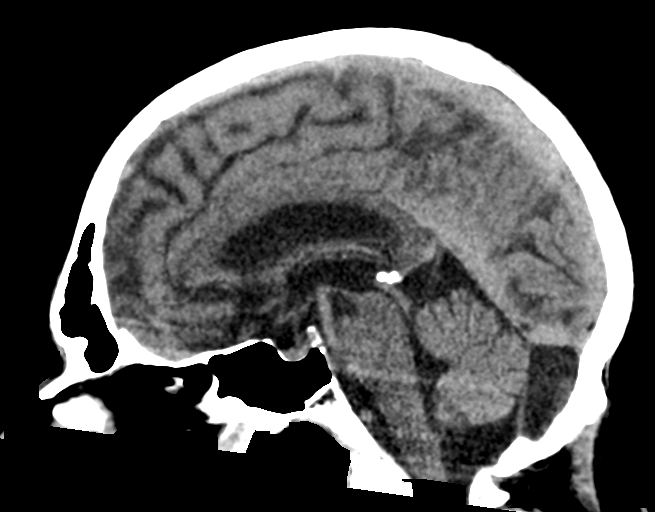
[im 40/60  brain]
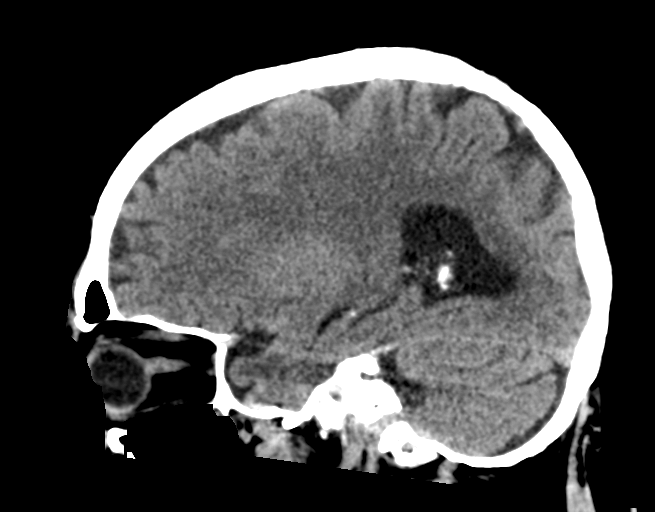

[15 of 47 positions shown; findings below may reference images not displayed]

FINDINGS: Brain: Mild generalized age related parenchymal volume loss with
commensurate dilatation of the ventricles and sulci. No mass,
hemorrhage, edema or other evidence of acute parenchymal
abnormality. No extra-axial hemorrhage.

Vascular: Chronic calcified atherosclerotic changes of the large
vessels at the skull base. No unexpected hyperdense vessel.

Skull: No acute findings.

Sinuses/Orbits: No acute finding.

Other: None.
IMPRESSION: Negative head CT. No intracranial mass, hemorrhage or edema.

## 2020-02-17 NOTE — Progress Notes (Signed)
Cardiology Office Note   Date:  02/18/2020   ID:  Steven Boyd, Steven Boyd June 16, 1940, MRN [id number]  PCP:  Redmond School, MD  Cardiologist:   Minus Breeding, MD   Chief Complaint  Patient presents with  . Atrial Fibrillation      History of Present Illness: Steven Boyd is a 79 y.o. male who presents for follow-up of atrial fibrillation.  He is due to have prostate biopsy next week.  He has had paroxysmal atrial fibrillation.  He has not had any symptomatic bouts of this but has never really felt his fibrillation.  He has been on Xarelto although currently he is in the donut hole.  He has a history of peripheral vascular disease.  He has not had ischemic heart disease and had a negative stress test in 2015.  He does have ongoing tobacco abuse.  He walks with a cane because of leg fracture that he had falling out of his truck.  He does walk every day.  He goes up and down stairs for exercise.  He denies any chest pressure, neck or arm discomfort.  Has had no palpitations, presyncope or syncope.  He has had no weight gain.  He has some mild lower extremity edema.    Past Medical History:  Diagnosis Date  . Diabetes mellitus without complication (HCC)    diet controlled  . GERD (gastroesophageal reflux disease)   . Hard of hearing    bilateral, no hearing aids  . Hypertension   . Prostate cancer [facility])     Past Surgical History:  Procedure Laterality Date  . BIOPSY  09/23/2016   Procedure: BIOPSY;  Surgeon: Rogene Houston, MD;  [facility];  Service: Endoscopy;;  gastric  . COLONOSCOPY N/A 05/08/2016   Procedure: COLONOSCOPY;  Surgeon: Rogene Houston, MD;  [facility];  Service: Endoscopy;  Laterality: N/A;  2:25-moved to 100 per Ann  . ESOPHAGOGASTRODUODENOSCOPY N/A 06/07/2016   Procedure: ESOPHAGOGASTRODUODENOSCOPY (EGD);  Surgeon: Rogene Houston, MD;  [facility];  Service: Endoscopy;  Laterality: N/A;  915  .  ESOPHAGOGASTRODUODENOSCOPY N/A 09/23/2016   Procedure: ESOPHAGOGASTRODUODENOSCOPY (EGD);  Surgeon: Rogene Houston, MD;  [facility];  Service: Endoscopy;  Laterality: N/A;  955  . FEMUR IM NAIL Left 12/24/2014   Procedure: INTRAMEDULLARY (IM) NAIL FEMORAL;  Surgeon: Marchia Bond, MD;  Location: [facility];  Service: Orthopedics;  Laterality: Left;  . POLYPECTOMY  05/08/2016   Procedure: POLYPECTOMY;  Surgeon: Rogene Houston, MD;  [facility];  Service: Endoscopy;;  sigmoid colon polypectomies x3      Current Outpatient Medications  Medication Sig Dispense Refill  . hydrALAZINE (APRESOLINE) 50 MG tablet Take 1 tablet by mouth twice daily 60 tablet 0  . lisinopril (PRINIVIL,ZESTRIL) 40 MG tablet Take 40 mg by mouth daily.    . metoprolol succinate (TOPROL-XL) 25 MG 24 hr tablet TAKE 1/2 (ONE-HALF) TABLET BY MOUTH ONCE DAILY WITH  OR  IMMEDIATELY  FOLLOWING  A  MEAL 45 tablet 2  . Omega-3 Fatty Acids (FISH OIL) 1200 MG CAPS Take 500 mg by mouth daily.     . rivaroxaban (XARELTO) 20 MG TABS tablet Take 1 tablet (20 mg total) by mouth daily with supper. (Patient taking differently: Take 20 mg by mouth every evening. ) 28 tablet 0  . tamsulosin (FLOMAX) 0.4 MG CAPS capsule Take 0.4 mg by mouth every evening.      No current  facility-administered medications for this visit.    Allergies:   Protonix [pantoprazole sodium] and Dilaudid [hydromorphone hcl]    ROS:  Please see the history of present illness.   Otherwise, review of systems are positive for none.   All other systems are reviewed and negative.    PHYSICAL EXAM: VS:  BP 124/71   Pulse (!) 58   Ht 6\' 2"  (1.88 m)   Wt 224 lb 12.8 oz (102 kg)   SpO2 98%   BMI 28.86 kg/m  , BMI Body mass index is 28.86 kg/m. GENERAL:  Well appearing HEENT:  Pupils equal round and reactive, fundi not visualized, oral mucosa unremarkable NECK:  No jugular venous distention, waveform within normal limits, carotid upstroke brisk and  symmetric, no bruits, no thyromegaly LYMPHATICS:  No cervical, inguinal adenopathy LUNGS:  Clear to auscultation bilaterally BACK:  No CVA tenderness CHEST:  Unremarkable HEART:  PMI not displaced or sustained,S1 and S2 within normal limits, no S3, no S4, no clicks, no rubs, no murmurs ABD:  Flat, positive bowel sounds normal in frequency in pitch, no bruits, no rebound, no guarding, no midline pulsatile mass, no hepatomegaly, no splenomegaly EXT:  2 plus pulses throughout, no edema, no cyanosis no clubbing SKIN:  No rashes no nodules NEURO:  Cranial nerves II through XII grossly intact, motor grossly intact throughout PSYCH:  Cognitively intact, oriented to person place and time    EKG:  EKG is ordered today. The ekg ordered today demonstrates sinus rhythm, rate 58, interventricular conduction delay with a left axis deviation, no acute ST-T wave changes.   Recent Labs: No results found for requested labs within last 8760 hours.    Lipid Panel    Component Value Date/Time   CHOL 161 05/13/2016 0837   TRIG 82 05/13/2016 0837   HDL 42 05/13/2016 0837   CHOLHDL 3.8 05/13/2016 0837   VLDL 16 05/13/2016 0837   LDLCALC 103 (H) 05/13/2016 0837      Wt Readings from Last 3 Encounters:  02/18/20 224 lb 12.8 oz (102 kg)  01/11/20 220 lb (99.8 kg)  08/15/19 240 lb (108.9 kg)      Other studies Reviewed: Additional studies/ records that were reviewed today include: Labs. Review of the above records demonstrates:  Please see elsewhere in the note.     ASSESSMENT AND PLAN:  Essential hypertension The blood pressure is currently well controlled.  No change in therapy.    Peripheral arterial disease ([place]) Doppler studies normal 06/2016.  Is not having any active claudication and we will try risk reduction.  Atrial fibrillation with controlled ventricular rate ([place]) He has atrial fibrillation.  He does not have any symptomatic paroxysms.  He is currently in sinus bradycardia.   He tolerates anticoagulation.  Unfortunately he cannot afford the Xarelto during the donut hole.  I tried to talk him into switching to warfarin but he does not want to do this.  He understands the risk of stroke when he is not taking his Xarelto.  We did try to give him some samples.  Of note he does know he needs to hold his anticoagulant 2 days prior to his biopsy.    Diet-controlled diabetes mellitus (HCC) A1c is 5.3.  No change in therapy.    Current smoker Encouraged smoking cessation.  Preop The patient is at acceptable risk for the planned biopsy.  No change in therapy.  No further testing.  He understands the need to hold his anticoagulation.   Current  medicines are reviewed at length with the patient today.  The patient does not have concerns regarding medicines.  The following changes have been made:  no change  Labs/ tests ordered today include: None  Orders Placed This Encounter  Procedures  . EKG 12-Lead     Disposition:   FU with me in 12 months.     Signed, Minus Breeding, MD  02/18/2020 12:56 PM    [facility]

## 2020-02-18 ENCOUNTER — Other Ambulatory Visit: Payer: Self-pay

## 2020-02-18 ENCOUNTER — Encounter: Payer: Self-pay | Admitting: Cardiology

## 2020-02-18 ENCOUNTER — Ambulatory Visit (INDEPENDENT_AMBULATORY_CARE_PROVIDER_SITE_OTHER): Payer: PPO | Admitting: Cardiology

## 2020-02-18 VITALS — BP 124/71 | HR 58 | Ht 74.0 in | Wt 224.8 lb

## 2020-02-18 DIAGNOSIS — I4891 Unspecified atrial fibrillation: Secondary | ICD-10-CM

## 2020-02-18 NOTE — Patient Instructions (Addendum)
Medication Instructions:  No changes *If you need a refill on your cardiac medications before your next appointment, please call your pharmacy*   Lab Work: None ordered If you have labs (blood work) drawn today and your tests are completely normal, you will receive your results only by: . MyChart Message (if you have MyChart) OR . A paper copy in the mail If you have any lab test that is abnormal or we need to change your treatment, we will call you to review the results.   Testing/Procedures: None ordered   Follow-Up: At [facility], you and your health needs are our priority.  As part of our continuing mission to provide you with exceptional heart care, we have created designated Provider Care Teams.  These Care Teams include your primary Cardiologist (physician) and Advanced Practice Providers (APPs -  Physician Assistants and Nurse Practitioners) who all work together to provide you with the care you need, when you need it.  We recommend signing up for the patient portal called "MyChart".  Sign up information is provided on this After Visit Summary.  MyChart is used to connect with patients for Virtual Visits (Telemedicine).  Patients are able to view lab/test results, encounter notes, upcoming appointments, etc.  Non-urgent messages can be sent to your provider as well.   To learn more about what you can do with MyChart, go to [email or web address].com.    Your next appointment:   12 month(s)  The format for your next appointment:   In Person  Provider:   James Hochrein, MD   Other Instructions None   

## 2020-02-19 DIAGNOSIS — I129 Hypertensive chronic kidney disease with stage 1 through stage 4 chronic kidney disease, or unspecified chronic kidney disease: Secondary | ICD-10-CM | POA: Diagnosis not present

## 2020-02-19 DIAGNOSIS — N182 Chronic kidney disease, stage 2 (mild): Secondary | ICD-10-CM | POA: Diagnosis not present

## 2020-02-19 DIAGNOSIS — I4891 Unspecified atrial fibrillation: Secondary | ICD-10-CM | POA: Diagnosis not present

## 2020-02-19 DIAGNOSIS — M1991 Primary osteoarthritis, unspecified site: Secondary | ICD-10-CM | POA: Diagnosis not present

## 2020-02-25 ENCOUNTER — Other Ambulatory Visit: Payer: Self-pay

## 2020-02-25 ENCOUNTER — Emergency Department (HOSPITAL_COMMUNITY)
Admission: EM | Admit: 2020-02-25 | Discharge: 2020-02-25 | Disposition: A | Discharge status: Home / Self Care | Payer: PPO | Attending: Emergency Medicine | Admitting: Emergency Medicine

## 2020-02-25 ENCOUNTER — Emergency Department (HOSPITAL_COMMUNITY): Payer: PPO

## 2020-02-25 ENCOUNTER — Encounter (HOSPITAL_COMMUNITY): Payer: Self-pay

## 2020-02-25 DIAGNOSIS — Z8546 Personal history of malignant neoplasm of prostate: Secondary | ICD-10-CM | POA: Diagnosis not present

## 2020-02-25 DIAGNOSIS — R55 Syncope and collapse: Secondary | ICD-10-CM | POA: Insufficient documentation

## 2020-02-25 DIAGNOSIS — Z79899 Other long term (current) drug therapy: Secondary | ICD-10-CM | POA: Insufficient documentation

## 2020-02-25 DIAGNOSIS — R5383 Other fatigue: Secondary | ICD-10-CM | POA: Diagnosis not present

## 2020-02-25 DIAGNOSIS — R531 Weakness: Secondary | ICD-10-CM | POA: Insufficient documentation

## 2020-02-25 DIAGNOSIS — I4891 Unspecified atrial fibrillation: Secondary | ICD-10-CM | POA: Diagnosis not present

## 2020-02-25 DIAGNOSIS — C61 Malignant neoplasm of prostate: Secondary | ICD-10-CM | POA: Diagnosis not present

## 2020-02-25 DIAGNOSIS — I10 Essential (primary) hypertension: Secondary | ICD-10-CM | POA: Insufficient documentation

## 2020-02-25 DIAGNOSIS — I517 Cardiomegaly: Secondary | ICD-10-CM | POA: Diagnosis not present

## 2020-02-25 DIAGNOSIS — R6883 Chills (without fever): Secondary | ICD-10-CM | POA: Diagnosis not present

## 2020-02-25 DIAGNOSIS — F17210 Nicotine dependence, cigarettes, uncomplicated: Secondary | ICD-10-CM | POA: Insufficient documentation

## 2020-02-25 DIAGNOSIS — Z7901 Long term (current) use of anticoagulants: Secondary | ICD-10-CM | POA: Insufficient documentation

## 2020-02-25 DIAGNOSIS — E119 Type 2 diabetes mellitus without complications: Secondary | ICD-10-CM | POA: Diagnosis not present

## 2020-02-25 DIAGNOSIS — R001 Bradycardia, unspecified: Secondary | ICD-10-CM | POA: Diagnosis not present

## 2020-02-25 LAB — CBC WITH DIFFERENTIAL/PLATELET
Abs Immature Granulocytes: 0.02 10*3/uL (ref 0.00–0.07)
Basophils Absolute: 0 10*3/uL (ref 0.0–0.1)
Basophils Relative: 0 %
Eosinophils Absolute: 0.1 10*3/uL (ref 0.0–0.5)
Eosinophils Relative: 1 %
HCT: 44.6 % (ref 39.0–52.0)
Hemoglobin: 14.7 g/dL (ref 13.0–17.0)
Immature Granulocytes: 0 %
Lymphocytes Relative: 13 %
Lymphs Abs: 1 10*3/uL (ref 0.7–4.0)
MCH: 30.4 pg (ref 26.0–34.0)
MCHC: 33 g/dL (ref 30.0–36.0)
MCV: 92.3 fL (ref 80.0–100.0)
Monocytes Absolute: 0.6 10*3/uL (ref 0.1–1.0)
Monocytes Relative: 7 %
Neutro Abs: 5.8 10*3/uL (ref 1.7–7.7)
Neutrophils Relative %: 79 %
Platelets: 142 10*3/uL — ABNORMAL LOW (ref 150–400)
RBC: 4.83 MIL/uL (ref 4.22–5.81)
RDW: 13.9 % (ref 11.5–15.5)
WBC: 7.5 10*3/uL (ref 4.0–10.5)
nRBC: 0 % (ref 0.0–0.2)

## 2020-02-25 LAB — COMPREHENSIVE METABOLIC PANEL
ALT: 19 U/L (ref 0–44)
AST: 19 U/L (ref 15–41)
Albumin: 4 g/dL (ref 3.5–5.0)
Alkaline Phosphatase: 73 U/L (ref 38–126)
Anion gap: 8 (ref 5–15)
BUN: 12 mg/dL (ref 8–23)
CO2: 26 mmol/L (ref 22–32)
Calcium: 8.9 mg/dL (ref 8.9–10.3)
Chloride: 103 mmol/L (ref 98–111)
Creatinine, Ser: 1.18 mg/dL (ref 0.61–1.24)
GFR, Estimated: >60 mL/min (ref >60)
Glucose, Bld: 120 mg/dL — ABNORMAL HIGH (ref 70–99)
Potassium: 3.8 mmol/L (ref 3.5–5.1)
Sodium: 137 mmol/L (ref 135–145)
Total Bilirubin: 1.1 mg/dL (ref 0.3–1.2)
Total Protein: 6.8 g/dL (ref 6.5–8.1)

## 2020-02-25 LAB — CBG MONITORING, ED: Glucose-Capillary: 112 mg/dL — ABNORMAL HIGH (ref 70–99)

## 2020-02-25 LAB — TROPONIN I (HIGH SENSITIVITY)
Troponin I (High Sensitivity): 7 ng/L (ref <18)
Troponin I (High Sensitivity): 7 ng/L (ref <18)

## 2020-02-25 MED ORDER — SODIUM CHLORIDE 0.9 % IV BOLUS
1000.0000 mL | Freq: Once | INTRAVENOUS | Status: AC
  Administered 2020-02-25: 1000 mL via INTRAVENOUS

## 2020-02-25 NOTE — ED Notes (Signed)
Warm blankets given to pt as requested.

## 2020-02-25 NOTE — ED Notes (Signed)
Xray bedside.

## 2020-02-25 NOTE — ED Notes (Addendum)
An After Visit Summary was printed and given to the patient. Discharge instructions given and no further questions at this time.  Pt leaving with his wife. Pt ambulatory at discharge.

## 2020-02-25 NOTE — ED Provider Notes (Signed)
[facility] DEPT Provider Note   CSN: [id number] Arrival date & time: 02/25/20  1425     History Chief Complaint  Patient presents with  . Post-op Problem    Steven Boyd is a 79 y.o. male.  HPI     79yo male with history of DM, hypertension, atrial fibrillation on xarelto, prostate cancer, who presents with concern for generalized weakness after leaving his prostate biopsy with Dr. Diona Fanti this afternoon.   Dr. Diona Fanti office 1PM prostate biopsy Driving in car away from biopsy and seemed out of it, sweating, felt hot, was speaking but difficult to understand, kept having foul smelling belching, then was gagging and had yello wstuf fin towel, not sure if mucus or vomit.  (has hx of prostate biopsy before).  Had cereal this AM Numb prostate but no other medications No chest pain or other symptoms  Some headache    Took levaquin this AM, likely 17cc lidocaine UA clear without evidence of infection this AM     Past Medical History:  Diagnosis Date  . Diabetes mellitus without complication (HCC)    diet controlled  . GERD (gastroesophageal reflux disease)   . Hard of hearing    bilateral, no hearing aids  . Hypertension   . Prostate cancer [facility])     Patient Active Problem List   Diagnosis Date Noted  . Atrial fibrillation with controlled ventricular rate ([place]) 05/26/2017  . Diet-controlled diabetes mellitus ([place]) 05/26/2017  . Buerger disease ([place]) 05/26/2017  . Tobacco use 05/26/2017  . History of Helicobacter infection 08/15/2016  . Chronic gastric ulcer 08/15/2016  . Abdominal pain, epigastric 05/29/2016  . Peripheral arterial disease ([place]) 05/29/2016  . Encounter for screening colonoscopy 03/18/2016  . Intertrochanteric fracture of left hip ([place]) 12/24/2014  . HTN (hypertension) 12/24/2014  . Angina pectoris ([place]) 02/28/2014    Past Surgical History:  Procedure Laterality Date  . BIOPSY  09/23/2016   Procedure:  BIOPSY;  Surgeon: Rogene Houston, MD;  [facility];  Service: Endoscopy;;  gastric  . COLONOSCOPY N/A 05/08/2016   Procedure: COLONOSCOPY;  Surgeon: Rogene Houston, MD;  [facility];  Service: Endoscopy;  Laterality: N/A;  2:25-moved to 100 per Ann  . ESOPHAGOGASTRODUODENOSCOPY N/A 06/07/2016   Procedure: ESOPHAGOGASTRODUODENOSCOPY (EGD);  Surgeon: Rogene Houston, MD;  [facility];  Service: Endoscopy;  Laterality: N/A;  915  . ESOPHAGOGASTRODUODENOSCOPY N/A 09/23/2016   Procedure: ESOPHAGOGASTRODUODENOSCOPY (EGD);  Surgeon: Rogene Houston, MD;  [facility];  Service: Endoscopy;  Laterality: N/A;  955  . FEMUR IM NAIL Left 12/24/2014   Procedure: INTRAMEDULLARY (IM) NAIL FEMORAL;  Surgeon: Marchia Bond, MD;  Location: [place];  Service: Orthopedics;  Laterality: Left;  . POLYPECTOMY  05/08/2016   Procedure: POLYPECTOMY;  Surgeon: Rogene Houston, MD;  [facility];  Service: Endoscopy;;  sigmoid colon polypectomies x3        Family History  Problem Relation Age of Onset  . Diabetes Mother   . Heart Problems Father        s/p heart valve repair 10 years before he passed away    Social History   Tobacco Use  . Smoking status: Current Every Day Smoker    Packs/day: 1.00    Years: 55.00    Pack years: 55.00    Types: Cigarettes  . Smokeless tobacco: Never Used  Vaping Use  . Vaping Use: Never used  Substance Use Topics  .  Alcohol use: No  . Drug use: No    Home Medications Prior to Admission medications   Medication Sig Start Date End Date Taking? Authorizing Provider  hydrALAZINE (APRESOLINE) 50 MG tablet Take 1 tablet by mouth twice daily 02/10/20   Lorretta Harp, MD  lisinopril (PRINIVIL,ZESTRIL) 40 MG tablet Take 40 mg by mouth daily. 05/16/18   [provider]  metoprolol succinate (TOPROL-XL) 25 MG 24 hr tablet TAKE 1/2 (ONE-HALF) TABLET BY MOUTH ONCE DAILY WITH  OR  IMMEDIATELY  FOLLOWING  A  MEAL  11/15/19   Lorretta Harp, MD  Omega-3 Fatty Acids (FISH OIL) 1200 MG CAPS Take 500 mg by mouth daily.     [provider]  rivaroxaban (XARELTO) 20 MG TABS tablet Take 1 tablet (20 mg total) by mouth daily with supper. Patient taking differently: Take 20 mg by mouth every evening.  09/24/16   Rogene Houston, MD  tamsulosin (FLOMAX) 0.4 MG CAPS capsule Take 0.4 mg by mouth every evening.     [provider]    Allergies    Protonix [pantoprazole sodium] and Dilaudid [hydromorphone hcl]  Review of Systems   Review of Systems  Constitutional: Positive for activity change, diaphoresis and fatigue. Negative for fever.  HENT: Negative for sore throat.   Eyes: Negative for visual disturbance.  Respiratory: Negative for cough and shortness of breath.   Cardiovascular: Negative for chest pain.  Gastrointestinal: Positive for nausea. Negative for abdominal pain and vomiting.  Genitourinary: Negative for difficulty urinating (has not attempted to urinate after procedure).  Musculoskeletal: Negative for back pain and neck stiffness.  Skin: Negative for rash.  Neurological: Positive for headaches. Negative for syncope.    Physical Exam Updated Vital Signs BP (!) 159/77   Pulse (!) 58   Temp 97.9 F (36.6 C) (Oral)   Resp 18   SpO2 98%   Physical Exam Vitals and nursing note reviewed.  Constitutional:      General: He is not in acute distress.    Appearance: He is well-developed. He is ill-appearing (fatigue). He is not diaphoretic.  HENT:     Head: Normocephalic and atraumatic.  Eyes:     General: No visual field deficit.    Conjunctiva/sclera: Conjunctivae normal.  Cardiovascular:     Rate and Rhythm: Regular rhythm. Bradycardia present.     Heart sounds: Normal heart sounds. No murmur heard.  No friction rub. No gallop.   Pulmonary:     Effort: Pulmonary effort is normal. No respiratory distress.     Breath sounds: Normal breath sounds. No wheezing or rales.   Abdominal:     General: There is no distension.     Palpations: Abdomen is soft.     Tenderness: There is no abdominal tenderness. There is no guarding.  Musculoskeletal:     Cervical back: Normal range of motion.  Skin:    General: Skin is warm and dry.  Neurological:     Mental Status: He is alert and oriented to person, place, and time.     Cranial Nerves: Cranial nerves are intact. No cranial nerve deficit, dysarthria or facial asymmetry.     Sensory: Sensation is intact. No sensory deficit.     Motor: Motor function is intact. No weakness.     Comments: Hard of hearing     ED Results / Procedures / Treatments   Labs (all labs ordered are listed, but only abnormal results are displayed) Labs Reviewed  CBC WITH DIFFERENTIAL/PLATELET -  Abnormal; Notable for the following components:      Result Value   Platelets 142 (*)    All other components within normal limits  COMPREHENSIVE METABOLIC PANEL - Abnormal; Notable for the following components:   Glucose, Bld 120 (*)    All other components within normal limits  CBG MONITORING, ED - Abnormal; Notable for the following components:   Glucose-Capillary 112 (*)    All other components within normal limits  TROPONIN I (HIGH SENSITIVITY)  TROPONIN I (HIGH SENSITIVITY)    EKG EKG Interpretation  Date/Time:  Friday February 25 2020 15:23:23 EDT Ventricular Rate:  43 PR Interval:    QRS Duration: 152 QT Interval:  516 QTC Calculation: 437 R Axis:   -26 Text Interpretation: Sinus bradycardia Prolonged PR interval IVCD, consider atypical LBBB No significant change since last tracing Confirmed by Gareth Morgan [phone]) on 02/25/2020 6:06:57 PM   Radiology DG Chest Portable 1 View  Result Date: 02/25/2020 CLINICAL DATA:  Fatigue.  Prostate biopsy earlier today. EXAM: PORTABLE CHEST 1 VIEW COMPARISON:  Radiograph 10/01/2018 FINDINGS: Stable mild cardiomegaly. Unchanged mediastinal contours with aortic atherosclerosis. Lower  lung volumes from prior exam with chronic interstitial coarsening. No acute or confluent airspace disease. Unchanged calcified granuloma at the right lung base. No pleural fluid or pneumothorax. Bones are under mineralized. No acute osseous abnormalities are seen. IMPRESSION: 1. No acute findings. 2. Stable mild cardiomegaly. Electronically Signed   By: Keith Rake M.D.   On: 02/25/2020 16:26    Procedures Procedures (including critical care time)  Medications Ordered in ED Medications  sodium chloride 0.9 % bolus 1,000 mL (0 mLs Intravenous Stopped 02/25/20 1917)    ED Course  I have reviewed the triage vital signs and the nursing notes.  Pertinent labs & imaging results that were available during my care of the patient were reviewed by me and considered in my medical decision making (see chart for details).    MDM Rules/Calculators/A&P                          79yo male with history of DM, hypertension, atrial fibrillation on xarelto, prostate cancer, who presents with concern for generalized weakness after leaving his prostate biopsy with Dr. Diona Fanti this afternoon.  Neurologic exam normal, no sign of dysarthria, aphasia, or weakness on exam and doubt acute CVA.  Heart rate noted to be in 40s--has been in priori ED visits. ECG shows sinus bradycardia which has been noted in past and blood pressures ok so do not suspect this is etiology of generalized weakness.  Discussed with Dr. Diona Fanti who did not have concerns regarding procedure or patient at time of procedure. Notes he had UA in the AM that did not have signs of infection.  Doubt lidocaine toxicity given no immediate effects, do not see clear signs of this on hx or exam.  Plan to continue to monitor in ED, evaluate for other etiologies of generalized weakness such as electrolyte abnormalities, anemia, ACS.  Stable mild cardiomegaly on CXR. Labs without significant abnormalities. Troponin negative x2 and doubt ACS.  Do not  suspect TIA given combination of symptoms. Consider early infection, vasovagal episode in setting of pain from biopsy.  Recommend continued monitoring of symptoms and close PCP follow up.    Final Clinical Impression(s) / ED Diagnoses Final diagnoses:  Generalized weakness  Vasovagal episode  Chills    Rx / DC Orders ED Discharge Orders    None  Gareth Morgan, MD 02/26/20 [phone]

## 2020-02-25 NOTE — ED Triage Notes (Signed)
Pt reports he had a biopsy done on his prostate earlier today and now he is not feeling well. When asking pt if he has any pain, he reports, he feels "hot" all over his body.

## 2020-03-21 ENCOUNTER — Ambulatory Visit (INDEPENDENT_AMBULATORY_CARE_PROVIDER_SITE_OTHER): Payer: PPO | Admitting: Urology

## 2020-03-21 ENCOUNTER — Encounter: Payer: Self-pay | Admitting: Urology

## 2020-03-21 ENCOUNTER — Other Ambulatory Visit: Payer: Self-pay

## 2020-03-21 VITALS — BP 157/88 | HR 64 | Temp 98.4°F | Ht 74.0 in | Wt 225.0 lb

## 2020-03-21 DIAGNOSIS — C61 Malignant neoplasm of prostate: Secondary | ICD-10-CM

## 2020-03-21 LAB — URINALYSIS, ROUTINE W REFLEX MICROSCOPIC
Bilirubin, UA: NEGATIVE
Glucose, UA: NEGATIVE
Ketones, UA: NEGATIVE
Nitrite, UA: NEGATIVE
Protein,UA: NEGATIVE
RBC, UA: NEGATIVE
Specific Gravity, UA: 1.02 (ref 1.005–1.030)
Urobilinogen, Ur: 0.2 mg/dL (ref 0.2–1.0)
pH, UA: 6 (ref 5.0–7.5)

## 2020-03-21 LAB — MICROSCOPIC EXAMINATION
Epithelial Cells (non renal): NONE SEEN /hpf (ref 0–10)
RBC, Urine: NONE SEEN /hpf (ref 0–2)
Renal Epithel, UA: NONE SEEN /hpf

## 2020-03-21 NOTE — Progress Notes (Signed)
11.30.2021: This gentleman comes in today with his family for prostate cancer conference.  (below copied from [facility] records):  He underwent ultrasound and biopsy of his prostate on 07/25/2015. At that time, PSA was rising and was 5.8. Prostatic volume was 117 cc. PSA density 0.05.  2/12 cores positive for adenocarcinoma (right base medial, right mid medial), both GS 3+3  both revealed 20% of the cores involved with cancer.  IPSS-10, quality of life. 2(initial)   Following consultation, the patient decided on active surveillance.   His Oncotype DX results areas follows:  GPS score 15  Likelihood of favorable pathology--92%  Likelihood of low-grade disease--95%  Likelihood of organ confined disease--96%.  SHIM- 1   07/23/2016: PSA is stable at 3.6   01/13/2017--PSA 4.4 He does have frequency and urgency and nocturia 2. He does drink sodas at night.   06/23/2017: MRI of the prostate performed. This revealed a single focus of signal abnormality at the right base, PI-RADS 2 lesion.prostatic volume was measured at 87 mL. There was no evidence of trans-capsular spread, seminal vesicle involvement, neurovascular bundle involvement, pelvic adenopathy or bony metastatic process.   4.2.2019: He presents for surveillance biopsy. Because of the absence of aggressive lesions on MRI, fusion biopsy is not performed. Prostatic volume was 96 mL. 3/12 cores revealed adenocarcinoma: Right base lateral, GS 3+3 ( 5% of core), right base medial GS 3+3 (40% of core), left base lateral GS 3+3 (10% of core).   10.8.2019: No urinary complaints over the past 6 months. No gross hematuria or dysuria. Most recent PSA 4.7.  9.21.2021: Most recent PSA 8.3 Pt denies any prostate related symptomatology at this time. Pt does note tolerable urinary frequency and urgency.  10.15.2021: MRI of prostate. 1.  PI-RADS category 4 lesion in the right para midline apical posterior peripheral zone 2.  No evidence of extracapsular  extension, neurovascular bundle or seminal vesicle involvement or suspicious bone lesion. 3.  Prostate volume 88 mL  11.5.2021: Fusion biopsy.  Prostate volume 95 mL.  All 4 cores from region of interest were negative for adenocarcinoma.  5/12 cores from sextant biopsies revealed adenocarcinoma: 3 cores revealed GS 3+3 pattern in 5%, 5% and 15% of cores (left mid lateral, right mid medial, left base lateral) 2 cores revealed GS 3+4 pattern in 10 in 5% of cores (right mid lateral, right base lateral)    Past Medical History:  Diagnosis Date  . Diabetes mellitus without complication (HCC)    diet controlled  . GERD (gastroesophageal reflux disease)   . Hard of hearing    bilateral, no hearing aids  . Hypertension   . Prostate cancer [facility])     Past Surgical History:  Procedure Laterality Date  . BIOPSY  09/23/2016   Procedure: BIOPSY;  Surgeon: Rogene Houston, MD;  [facility];  Service: Endoscopy;;  gastric  . COLONOSCOPY N/A 05/08/2016   Procedure: COLONOSCOPY;  Surgeon: Rogene Houston, MD;  [facility];  Service: Endoscopy;  Laterality: N/A;  2:25-moved to 100 per Ann  . ESOPHAGOGASTRODUODENOSCOPY N/A 06/07/2016   Procedure: ESOPHAGOGASTRODUODENOSCOPY (EGD);  Surgeon: Rogene Houston, MD;  [facility];  Service: Endoscopy;  Laterality: N/A;  915  . ESOPHAGOGASTRODUODENOSCOPY N/A 09/23/2016   Procedure: ESOPHAGOGASTRODUODENOSCOPY (EGD);  Surgeon: Rogene Houston, MD;  [facility];  Service: Endoscopy;  Laterality: N/A;  955  . FEMUR IM NAIL Left 12/24/2014   Procedure: INTRAMEDULLARY (IM) NAIL FEMORAL;  Surgeon: Marchia Bond,  MD;  Location: [place];  Service: Orthopedics;  Laterality: Left;  . POLYPECTOMY  05/08/2016   Procedure: POLYPECTOMY;  Surgeon: Rogene Houston, MD;  [facility];  Service: Endoscopy;;  sigmoid colon polypectomies x3     Home Medications:  Allergies as of 03/21/2020      Reactions   Protonix  [pantoprazole Sodium] Nausea And Vomiting   Patient states that after taking a few doses this happened , he stopped the medication.   Dilaudid [hydromorphone Hcl] Nausea And Vomiting      Medication List       Accurate as of March 21, 2020 12:42 PM. If you have any questions, ask your nurse or doctor.        Fish Oil 1200 MG Caps Take 500 mg by mouth daily.   hydrALAZINE 50 MG tablet Commonly known as: APRESOLINE Take 1 tablet by mouth twice daily   lisinopril 40 MG tablet Commonly known as: ZESTRIL Take 40 mg by mouth daily.   metoprolol succinate 25 MG 24 hr tablet Commonly known as: TOPROL-XL TAKE 1/2 (ONE-HALF) TABLET BY MOUTH ONCE DAILY WITH  OR  IMMEDIATELY  FOLLOWING  A  MEAL   rivaroxaban 20 MG Tabs tablet Commonly known as: Xarelto Take 1 tablet (20 mg total) by mouth daily with supper. What changed: when to take this   tamsulosin 0.4 MG Caps capsule Commonly known as: FLOMAX Take 0.4 mg by mouth every evening.       Allergies:  Allergies  Allergen Reactions  . Protonix [Pantoprazole Sodium] Nausea And Vomiting    Patient states that after taking a few doses this happened , he stopped the medication.  . Dilaudid [Hydromorphone Hcl] Nausea And Vomiting    Family History  Problem Relation Age of Onset  . Diabetes Mother   . Heart Problems Father        s/p heart valve repair 10 years before he passed away    Social History:  reports that he has been smoking cigarettes. He has a 55.00 pack-year smoking history. He has never used smokeless tobacco. He reports that he does not drink alcohol and does not use drugs.  ROS: A complete review of systems was performed.  All systems are negative except for pertinent findings as noted.  I have reviewed prior pt notes  I have reviewed notes from referring/previous physicians  I have reviewed urinalysis results  I have independently reviewed prior imaging  I have reviewed prior PSA results  I have  reviewed prior urine culture  Impression/Assessment:  Favorable intermediate risk prostate cancer in a 79 year old male thus far followed with active surveillance.  There has been grade progression.  Plan:  I spoke with the patient and his wife and daughter for 47 minutes today.  We discussed the fact that he does have progression of his prostate cancer, with him now having GS 3+4 pattern adenocarcinoma, although small volume.  He has had increased volume of cores with cancer as well.  We discussed continuing with active surveillance and proceeding with radiation therapy (due to prostate volume, I do not think he would be a candidate for brachytherapy).  Risks and benefits of active surveillance and radiation were discussed.  They are leaning a bit towards radiation at this point.  I would suggest that they get a consultation.  They agree with this, and I will set up an appointment for him to see Dr. Adella Nissen at [facility].

## 2020-03-21 NOTE — Progress Notes (Signed)
Urological Symptom Review  Patient is experiencing the following symptoms: Frequent urination Get up at night to urinate   Review of Systems  Gastrointestinal (upper)  : Indigestion/heartburn  Gastrointestinal (lower) : Negative for lower GI symptoms  Constitutional : Negative for symptoms  Skin: Negative for skin symptoms  Eyes: Blurred vision  Ear/Nose/Throat : Negative for Ear/Nose/Throat symptoms  Hematologic/Lymphatic: Negative for Hematologic/Lymphatic symptoms  Cardiovascular : Negative for cardiovascular symptoms  Respiratory : Negative for respiratory symptoms  Endocrine: Negative for endocrine symptoms  Musculoskeletal: Back pain  Neurological: Dizziness  Psychologic: Negative for psychiatric symptoms

## 2020-03-27 ENCOUNTER — Other Ambulatory Visit: Payer: Self-pay | Admitting: Cardiovascular Disease

## 2020-05-01 ENCOUNTER — Other Ambulatory Visit: Payer: Self-pay | Admitting: Urology

## 2020-05-01 DIAGNOSIS — C61 Malignant neoplasm of prostate: Secondary | ICD-10-CM

## 2020-06-27 ENCOUNTER — Other Ambulatory Visit: Payer: PPO

## 2020-07-04 ENCOUNTER — Ambulatory Visit: Payer: PPO | Admitting: Urology

## 2020-07-19 DIAGNOSIS — I4891 Unspecified atrial fibrillation: Secondary | ICD-10-CM | POA: Diagnosis not present

## 2020-07-19 DIAGNOSIS — I129 Hypertensive chronic kidney disease with stage 1 through stage 4 chronic kidney disease, or unspecified chronic kidney disease: Secondary | ICD-10-CM | POA: Diagnosis not present

## 2020-07-19 DIAGNOSIS — N182 Chronic kidney disease, stage 2 (mild): Secondary | ICD-10-CM | POA: Diagnosis not present

## 2020-07-31 ENCOUNTER — Other Ambulatory Visit: Payer: Self-pay | Admitting: Cardiovascular Disease

## 2020-08-10 ENCOUNTER — Other Ambulatory Visit: Payer: Self-pay

## 2020-08-10 ENCOUNTER — Encounter: Payer: Self-pay | Admitting: Podiatry

## 2020-08-10 ENCOUNTER — Ambulatory Visit: Payer: PPO | Admitting: Podiatry

## 2020-08-10 DIAGNOSIS — M79676 Pain in unspecified toe(s): Secondary | ICD-10-CM

## 2020-08-10 DIAGNOSIS — B351 Tinea unguium: Secondary | ICD-10-CM

## 2020-08-10 DIAGNOSIS — Q828 Other specified congenital malformations of skin: Secondary | ICD-10-CM

## 2020-08-10 NOTE — Progress Notes (Signed)
This patient returns to my office for at risk foot care.  This patient requires this care by a professional since this patient will be at risk due to having diet controlled diabetes and PAD and coagulation defect.  Patient is taking xarelto.  Patient has painful callus on the forefoot both feet. This patient is unable to cut nails himself since the patient cannot reach his nails.These nails are painful walking and wearing shoes.  This patient presents for at risk foot care today.  General Appearance  Alert, conversant and in no acute stress.  Vascular  Dorsalis pedis and posterior tibial  pulses are palpable  bilaterally.  Capillary return is within normal limits  bilaterally. Temperature is within normal limits  bilaterally.  Neurologic  Senn-Weinstein monofilament wire test within normal limits  bilaterally. Muscle power within normal limits bilaterally.  Nails Thick disfigured discolored nails with subungual debris  from hallux to fifth toes bilaterally. No evidence of bacterial infection or drainage bilaterally.  Orthopedic  No limitations of motion  feet .  No crepitus or effusions noted.  No bony pathology or digital deformities noted.  Skin  normotropic skin  noted bilaterally.  No signs of infections or ulcers noted.   Callus sub 5th met  B/L.  Onychomycosis  Pain in right toes  Pain in left toes  Callus  B/L.  Consent was obtained for treatment procedures.   Mechanical debridement of nails 1-5  bilaterally performed with a nail nipper.  Filed with dremel without incident. Debride callus with # 15 blade.  Padding applied to callus right foot.   Return office visit   4 months                   Told patient to return for periodic foot care and evaluation due to potential at risk complications.   Gardiner Barefoot DPM

## 2020-08-19 DIAGNOSIS — I4891 Unspecified atrial fibrillation: Secondary | ICD-10-CM | POA: Diagnosis not present

## 2020-08-19 DIAGNOSIS — N182 Chronic kidney disease, stage 2 (mild): Secondary | ICD-10-CM | POA: Diagnosis not present

## 2020-08-19 DIAGNOSIS — I129 Hypertensive chronic kidney disease with stage 1 through stage 4 chronic kidney disease, or unspecified chronic kidney disease: Secondary | ICD-10-CM | POA: Diagnosis not present

## 2020-09-19 DIAGNOSIS — N182 Chronic kidney disease, stage 2 (mild): Secondary | ICD-10-CM | POA: Diagnosis not present

## 2020-09-19 DIAGNOSIS — I129 Hypertensive chronic kidney disease with stage 1 through stage 4 chronic kidney disease, or unspecified chronic kidney disease: Secondary | ICD-10-CM | POA: Diagnosis not present

## 2020-09-19 DIAGNOSIS — I4891 Unspecified atrial fibrillation: Secondary | ICD-10-CM | POA: Diagnosis not present

## 2020-10-19 DIAGNOSIS — I4891 Unspecified atrial fibrillation: Secondary | ICD-10-CM | POA: Diagnosis not present

## 2020-10-19 DIAGNOSIS — I129 Hypertensive chronic kidney disease with stage 1 through stage 4 chronic kidney disease, or unspecified chronic kidney disease: Secondary | ICD-10-CM | POA: Diagnosis not present

## 2020-10-19 DIAGNOSIS — N182 Chronic kidney disease, stage 2 (mild): Secondary | ICD-10-CM | POA: Diagnosis not present

## 2020-10-29 ENCOUNTER — Other Ambulatory Visit: Payer: Self-pay | Admitting: Cardiovascular Disease

## 2020-10-30 ENCOUNTER — Telehealth: Payer: Self-pay | Admitting: Cardiology

## 2020-10-30 NOTE — Telephone Encounter (Signed)
*  STAT* If patient is at the pharmacy, call can be transferred to refill team.   1. Which medications need to be refilled? (please list name of each medication and dose if known) metoprolol succinate (TOPROL-XL) 25 MG 24 hr tablet  2. Which pharmacy/location (including street and city if local pharmacy) is medication to be sent to? [place], [place] [address]  3. Do they need a 30 day or 90 day supply? [place]

## 2020-11-08 DIAGNOSIS — I10 Essential (primary) hypertension: Secondary | ICD-10-CM | POA: Diagnosis not present

## 2020-11-08 DIAGNOSIS — Z1331 Encounter for screening for depression: Secondary | ICD-10-CM | POA: Diagnosis not present

## 2020-11-08 DIAGNOSIS — M1991 Primary osteoarthritis, unspecified site: Secondary | ICD-10-CM | POA: Diagnosis not present

## 2020-11-08 DIAGNOSIS — N182 Chronic kidney disease, stage 2 (mild): Secondary | ICD-10-CM | POA: Diagnosis not present

## 2020-11-08 DIAGNOSIS — E663 Overweight: Secondary | ICD-10-CM | POA: Diagnosis not present

## 2020-11-08 DIAGNOSIS — E119 Type 2 diabetes mellitus without complications: Secondary | ICD-10-CM | POA: Diagnosis not present

## 2020-11-08 DIAGNOSIS — E1129 Type 2 diabetes mellitus with other diabetic kidney complication: Secondary | ICD-10-CM | POA: Diagnosis not present

## 2020-11-08 DIAGNOSIS — I4891 Unspecified atrial fibrillation: Secondary | ICD-10-CM | POA: Diagnosis not present

## 2020-11-08 DIAGNOSIS — E782 Mixed hyperlipidemia: Secondary | ICD-10-CM | POA: Diagnosis not present

## 2020-11-08 DIAGNOSIS — C61 Malignant neoplasm of prostate: Secondary | ICD-10-CM | POA: Diagnosis not present

## 2020-11-08 DIAGNOSIS — Z0001 Encounter for general adult medical examination with abnormal findings: Secondary | ICD-10-CM | POA: Diagnosis not present

## 2020-11-08 DIAGNOSIS — Z6828 Body mass index (BMI) 28.0-28.9, adult: Secondary | ICD-10-CM | POA: Diagnosis not present

## 2020-11-08 DIAGNOSIS — F0390 Unspecified dementia without behavioral disturbance: Secondary | ICD-10-CM | POA: Diagnosis not present

## 2020-12-08 DIAGNOSIS — U071 COVID-19: Secondary | ICD-10-CM | POA: Diagnosis not present

## 2020-12-14 ENCOUNTER — Ambulatory Visit: Payer: PPO | Admitting: Podiatry

## 2021-01-29 ENCOUNTER — Other Ambulatory Visit: Payer: Self-pay | Admitting: Cardiovascular Disease

## 2021-02-07 ENCOUNTER — Telehealth: Payer: Self-pay | Admitting: Cardiology

## 2021-02-07 NOTE — Telephone Encounter (Signed)
Routing to nl refills to package sample Pt qualifies for xarelto 20mg  qd samples but please include pt assistance form: [email or web address]

## 2021-02-07 NOTE — Telephone Encounter (Signed)
Pt c/o medication issue:  1. Name of Medication: rivaroxaban (XARELTO) 20 MG TABS tablet  2. How are you currently taking this medication (dosage and times per day)? Take 1 tablet (20 mg total) by mouth daily with supper.Patient taking differently: Take 20 mg by mouth every evening.  3. Are you having a reaction (difficulty breathing--STAT)? no  4. What is your medication issue? Calling to see if patient can get some samples of the medication. He is the doughnut hole.   Also daughter states that the patient has been taking Asprin

## 2021-02-08 ENCOUNTER — Telehealth: Payer: Self-pay | Admitting: *Deleted

## 2021-02-08 NOTE — Telephone Encounter (Signed)
Pt walked in office requesting Xarelto 20 mg samples. Pt states that he is in the donut hole. Pt given 4 Sample bottles of Xarelto 20 mg Lot # [id number], Exp: 4-24. Pt also given Patient assistance application for Xarelto and asked to return completed to next visit.

## 2021-02-08 NOTE — Telephone Encounter (Signed)
Pt's daughter is following up on the Samples of Xarelto from 02/07/21

## 2021-02-13 ENCOUNTER — Ambulatory Visit: Payer: PPO | Admitting: Cardiology

## 2021-03-02 ENCOUNTER — Ambulatory Visit
Admission: EM | Admit: 2021-03-02 | Discharge: 2021-03-02 | Disposition: A | Discharge status: Home / Self Care | Payer: PPO | Attending: Emergency Medicine | Admitting: Emergency Medicine

## 2021-03-02 ENCOUNTER — Other Ambulatory Visit: Payer: Self-pay

## 2021-03-02 DIAGNOSIS — Z20828 Contact with and (suspected) exposure to other viral communicable diseases: Secondary | ICD-10-CM

## 2021-03-02 DIAGNOSIS — J069 Acute upper respiratory infection, unspecified: Secondary | ICD-10-CM | POA: Diagnosis not present

## 2021-03-02 LAB — POCT INFLUENZA A/B
Influenza A, POC: NEGATIVE
Influenza B, POC: NEGATIVE

## 2021-03-02 MED ORDER — OSELTAMIVIR PHOSPHATE 75 MG PO CAPS: 75.0000 mg | ORAL_CAPSULE | Freq: Two times a day (BID) | ORAL | 0 refills | Status: DC

## 2021-03-02 MED ORDER — FLUTICASONE PROPIONATE 50 MCG/ACT NA SUSP: 2.0000 | Freq: Every day | NASAL | 0 refills | Status: DC

## 2021-03-02 NOTE — Discharge Instructions (Addendum)
Take 1000 mg of Tylenol 3-4 times a day as needed for headaches.  Flonase, saline nasal irrigation with a Milta Deiters Med rinse and distilled water as often as you want for the nasal congestion.  Start the Tamiflu within 72 hours of symptom onset.  I hope that you feel better soon.

## 2021-03-02 NOTE — ED Provider Notes (Signed)
HPI  SUBJECTIVE:  Steven Boyd is a 80 y.o. male who presents with 2 days of headaches, nasal congestion, rhinorrhea, sore throat, cough.  His grandson who lives with him has the flu.  He is worried about influenza.  No fevers, body aches, postnasal drip, wheezing, shortness of breath, nausea, vomiting, diarrhea, abdominal pain.  No known COVID exposure.  No antipyretic in the past 6 hours.  He has tried hot soups with improvement in his symptoms.  No aggravating factors.  He had COVID 6 months control.  He is a smoker, has atrial fibrillation, on Xarelto, diet-controlled diabetes, peripheral arterial disease.  KPT:WSFKC, Purcell Nails, MD   Past Medical History:  Diagnosis Date   Diabetes mellitus without complication (HCC)    diet controlled   GERD (gastroesophageal reflux disease)    Hard of hearing    bilateral, no hearing aids   Hypertension    Prostate cancer [facility])     Past Surgical History:  Procedure Laterality Date   BIOPSY  09/23/2016   Procedure: BIOPSY;  Surgeon: Rogene Houston, MD;  [facility];  Service: Endoscopy;;  gastric   COLONOSCOPY N/A 05/08/2016   Procedure: COLONOSCOPY;  Surgeon: Rogene Houston, MD;  [facility];  Service: Endoscopy;  Laterality: N/A;  2:25-moved to 100 per Ann   ESOPHAGOGASTRODUODENOSCOPY N/A 06/07/2016   Procedure: ESOPHAGOGASTRODUODENOSCOPY (EGD);  Surgeon: Rogene Houston, MD;  [facility];  Service: Endoscopy;  Laterality: N/A;  915   ESOPHAGOGASTRODUODENOSCOPY N/A 09/23/2016   Procedure: ESOPHAGOGASTRODUODENOSCOPY (EGD);  Surgeon: Rogene Houston, MD;  [facility];  Service: Endoscopy;  Laterality: N/A;  955   FEMUR IM NAIL Left 12/24/2014   Procedure: INTRAMEDULLARY (IM) NAIL FEMORAL;  Surgeon: Marchia Bond, MD;  Location: [place];  Service: Orthopedics;  Laterality: Left;   POLYPECTOMY  05/08/2016   Procedure: POLYPECTOMY;  Surgeon: Rogene Houston, MD;  [facility];  Service:  Endoscopy;;  sigmoid colon polypectomies x3     Family History  Problem Relation Age of Onset   Diabetes Mother    Heart Problems Father        s/p heart valve repair 10 years before he passed away    Social History   Tobacco Use   Smoking status: Every Day    Packs/day: 1.00    Years: 55.00    Pack years: 55.00    Types: Cigarettes   Smokeless tobacco: Never  Vaping Use   Vaping Use: Never used  Substance Use Topics   Alcohol use: No   Drug use: No    No current facility-administered medications for this encounter.  Current Outpatient Medications:    fluticasone (FLONASE) 50 MCG/ACT nasal spray, Place 2 sprays into both nostrils daily., Disp: 16 g, Rfl: 0   oseltamivir (TAMIFLU) 75 MG capsule, Take 1 capsule (75 mg total) by mouth 2 (two) times daily. X 5 days, Disp: 10 capsule, Rfl: 0   hydrALAZINE (APRESOLINE) 50 MG tablet, Take 1 tablet by mouth twice daily, Disp: 180 tablet, Rfl: 0   lisinopril (PRINIVIL,ZESTRIL) 40 MG tablet, Take 40 mg by mouth daily., Disp: , Rfl:    metoprolol succinate (TOPROL-XL) 25 MG 24 hr tablet, TAKE 1/2 (ONE-HALF) TABLET BY MOUTH ONCE DAILY WITH OR IMMEDIATELY  FOLLOWING  A  MEAL, Disp: 45 tablet, Rfl: 0   Omega-3 Fatty Acids (FISH OIL) 1200 MG CAPS, Take 500 mg by mouth daily. , Disp: , Rfl:    rivaroxaban (  XARELTO) 20 MG TABS tablet, Take 1 tablet (20 mg total) by mouth daily with supper. (Patient taking differently: Take 20 mg by mouth every evening.), Disp: 28 tablet, Rfl: 0   tamsulosin (FLOMAX) 0.4 MG CAPS capsule, Take 0.4 mg by mouth every evening. , Disp: , Rfl:   Allergies  Allergen Reactions   Protonix [Pantoprazole Sodium] Nausea And Vomiting    Patient states that after taking a few doses this happened , he stopped the medication.   Dilaudid [Hydromorphone Hcl] Nausea And Vomiting     ROS  As noted in HPI.   Physical Exam  BP (!) 148/72 (BP Location: Right Arm)   Pulse (!) 59   Temp 98.5 F (36.9 C) (Oral)   Resp 18    SpO2 94%   Constitutional: Well developed, well nourished, no acute distress Eyes:  EOMI, conjunctiva normal bilaterally HENT: Normocephalic, atraumatic,mucus membranes moist.  Extensive nasal congestion.  No maxillary, frontal sinus tenderness.  Normal oropharynx.  No obvious postnasal drip. Respiratory: Normal inspiratory effort, lungs clear bilaterally Cardiovascular: Normal rate regular rhythm, no murmurs rubs or gallops GI: nondistended skin: No rash, skin intact Musculoskeletal: no deformities Neurologic: Alert & oriented x 3, no focal neuro deficits Psychiatric: Speech and behavior appropriate   ED Course   Medications - No data to display  Orders Placed This Encounter  Procedures   POCT Influenza A/B    Standing Status:   Standing    Number of Occurrences:   1    No results found for this or any previous visit (from the past 24 hour(s)).  No results found.  ED Clinical Impression  1. Acute upper respiratory infection   2. Exposure to influenza      ED Assessment/Plan  Patient with significant exposure to influenza.  While his rapid flu is negative, I am concerned that  confirmatory PCR testing will not be back in enough time to start him on Tamiflu, so am starting him on Tamiflu empirically.  Home with Tylenol, Flonase, saline nasal irrigation.  Follow-up with PMD as needed.   Discussed labs, MDM, treatment plan, and plan for follow-up with patient. patient agrees with plan.   Meds ordered this encounter  Medications   oseltamivir (TAMIFLU) 75 MG capsule    Sig: Take 1 capsule (75 mg total) by mouth 2 (two) times daily. X 5 days    Dispense:  10 capsule    Refill:  0   fluticasone (FLONASE) 50 MCG/ACT nasal spray    Sig: Place 2 sprays into both nostrils daily.    Dispense:  16 g    Refill:  0      *This clinic note was created using Lobbyist. Therefore, there may be occasional mistakes despite careful proofreading.  ?     Melynda Ripple, MD 03/04/21 1444

## 2021-03-02 NOTE — ED Triage Notes (Signed)
Patient presents to Urgent Care with complaints of cough and nasal congestion x 2 days ago. Concerned with having the flu.   Denies fever.

## 2021-03-22 ENCOUNTER — Telehealth: Payer: Self-pay | Admitting: Orthopedic Surgery

## 2021-03-22 ENCOUNTER — Telehealth: Payer: Self-pay | Admitting: Cardiology

## 2021-03-22 ENCOUNTER — Encounter: Payer: Self-pay | Admitting: Cardiology

## 2021-03-22 NOTE — Telephone Encounter (Signed)
error 

## 2021-03-22 NOTE — Telephone Encounter (Signed)
Noted, chart closed. C.Boluwatife Mutchler, RN

## 2021-03-22 NOTE — Telephone Encounter (Signed)
(  Cont'd) patient had called to inquire about being seen for back issues - states already being seen by back specialist in [place]; mentioned also has had dry needling and asked if we do any of those types of services here. Relayed that our providers are general orthopaedic and sports medicine specialists, and based on history and current treatment, relayed we do not provide the services he is treating for. Voiced understanding.

## 2021-03-22 NOTE — Telephone Encounter (Signed)
patient

## 2021-03-22 NOTE — Telephone Encounter (Signed)
Patient walked in requesting Xarelto 20mg  samples to last until he is out of the doughnut hole.   2 bottles given to patient and 1 year follow up appointment was scheduled with Dr. Percival Spanish at the [facility] office. Lot#: [id number] Exp: 07/2022

## 2021-04-17 ENCOUNTER — Encounter (INDEPENDENT_AMBULATORY_CARE_PROVIDER_SITE_OTHER): Payer: Self-pay | Admitting: *Deleted

## 2021-04-24 ENCOUNTER — Other Ambulatory Visit: Payer: Self-pay | Admitting: Cardiovascular Disease

## 2021-05-01 ENCOUNTER — Other Ambulatory Visit: Payer: Self-pay | Admitting: Cardiovascular Disease

## 2021-05-08 ENCOUNTER — Ambulatory Visit: Payer: PPO | Admitting: Cardiology

## 2021-06-15 ENCOUNTER — Ambulatory Visit: Payer: PPO | Admitting: Cardiology

## 2021-08-05 ENCOUNTER — Other Ambulatory Visit: Payer: Self-pay | Admitting: Cardiovascular Disease

## 2021-09-05 ENCOUNTER — Other Ambulatory Visit: Payer: Self-pay | Admitting: Cardiovascular Disease

## 2021-10-01 ENCOUNTER — Other Ambulatory Visit: Payer: Self-pay | Admitting: Cardiovascular Disease

## 2021-10-26 ENCOUNTER — Other Ambulatory Visit: Payer: Self-pay

## 2021-10-26 ENCOUNTER — Other Ambulatory Visit: Payer: Self-pay | Admitting: Cardiovascular Disease

## 2021-11-07 ENCOUNTER — Encounter: Payer: Self-pay | Admitting: Podiatry

## 2021-11-07 ENCOUNTER — Ambulatory Visit: Payer: PPO | Admitting: Podiatry

## 2021-11-07 DIAGNOSIS — B351 Tinea unguium: Secondary | ICD-10-CM

## 2021-11-07 DIAGNOSIS — M79676 Pain in unspecified toe(s): Secondary | ICD-10-CM | POA: Diagnosis not present

## 2021-11-07 DIAGNOSIS — D2372 Other benign neoplasm of skin of left lower limb, including hip: Secondary | ICD-10-CM

## 2021-11-07 DIAGNOSIS — M7751 Other enthesopathy of right foot: Secondary | ICD-10-CM | POA: Diagnosis not present

## 2021-11-07 DIAGNOSIS — D2371 Other benign neoplasm of skin of right lower limb, including hip: Secondary | ICD-10-CM

## 2021-11-07 DIAGNOSIS — M7752 Other enthesopathy of left foot: Secondary | ICD-10-CM

## 2021-11-07 MED ORDER — DEXAMETHASONE SODIUM PHOSPHATE 120 MG/30ML IJ SOLN
4.0000 mg | Freq: Once | INTRAMUSCULAR | Status: AC
  Administered 2021-11-07: 4 mg via INTRA_ARTICULAR

## 2021-11-07 NOTE — Progress Notes (Signed)
He presents today chief complaint of painful subfifth metatarsal calluses he is also complaining of painful elongated toenails.  Objective: Pulses remain palpable no open lesions or wounds.  Benign hyperkeratotic lesion Sub fifth metatarsal heads which are painful and fluctuant on palpation.  Bursitis is most likely the cause of the fluctuance.  Toenails are long thick yellow dystrophic onychomycotic.  Assessment: Pain in limb secondary to onychomycosis as well as bursitis with benign skin lesions.  Plan: Debrided benign skin lesions debrided toenails 1 through 5 bilaterally and injected each versus some fifth metatarsal head bilaterally 2 mg dexamethasone local anesthetic each.  Follow-up with him as needed

## 2021-11-13 ENCOUNTER — Other Ambulatory Visit: Payer: Self-pay | Admitting: Cardiovascular Disease

## 2021-11-27 ENCOUNTER — Other Ambulatory Visit: Payer: Self-pay | Admitting: Cardiovascular Disease

## 2021-11-29 ENCOUNTER — Other Ambulatory Visit: Payer: Self-pay

## 2021-11-29 MED ORDER — HYDRALAZINE HCL 50 MG PO TABS: 50.0000 mg | ORAL_TABLET | Freq: Two times a day (BID) | ORAL | 0 refills | Status: DC

## 2022-01-15 DIAGNOSIS — I10 Essential (primary) hypertension: Secondary | ICD-10-CM | POA: Diagnosis not present

## 2022-01-15 DIAGNOSIS — E1140 Type 2 diabetes mellitus with diabetic neuropathy, unspecified: Secondary | ICD-10-CM | POA: Diagnosis not present

## 2022-01-15 DIAGNOSIS — E663 Overweight: Secondary | ICD-10-CM | POA: Diagnosis not present

## 2022-01-15 DIAGNOSIS — E782 Mixed hyperlipidemia: Secondary | ICD-10-CM | POA: Diagnosis not present

## 2022-01-15 DIAGNOSIS — N410 Acute prostatitis: Secondary | ICD-10-CM | POA: Diagnosis not present

## 2022-01-15 DIAGNOSIS — Z6827 Body mass index (BMI) 27.0-27.9, adult: Secondary | ICD-10-CM | POA: Diagnosis not present

## 2022-01-15 DIAGNOSIS — E559 Vitamin D deficiency, unspecified: Secondary | ICD-10-CM | POA: Diagnosis not present

## 2022-01-15 DIAGNOSIS — Z1331 Encounter for screening for depression: Secondary | ICD-10-CM | POA: Diagnosis not present

## 2022-01-15 DIAGNOSIS — Z0001 Encounter for general adult medical examination with abnormal findings: Secondary | ICD-10-CM | POA: Diagnosis not present

## 2022-01-15 DIAGNOSIS — N400 Enlarged prostate without lower urinary tract symptoms: Secondary | ICD-10-CM | POA: Diagnosis not present

## 2022-01-15 DIAGNOSIS — D518 Other vitamin B12 deficiency anemias: Secondary | ICD-10-CM | POA: Diagnosis not present

## 2022-01-15 DIAGNOSIS — E039 Hypothyroidism, unspecified: Secondary | ICD-10-CM | POA: Diagnosis not present

## 2022-01-15 DIAGNOSIS — M1991 Primary osteoarthritis, unspecified site: Secondary | ICD-10-CM | POA: Diagnosis not present

## 2022-01-28 ENCOUNTER — Telehealth: Payer: Self-pay | Admitting: *Deleted

## 2022-01-28 NOTE — Telephone Encounter (Signed)
Pt provided with 3 bottles of Xarelto 20 mg ( #21 tablets) to last until next appt. Medication reviewed with Edrick Oh, RN,

## 2022-01-28 NOTE — Telephone Encounter (Signed)
Received request for Xarelto samples: Indication: AF Last office visit: 02/18/20  Vita Barley MD Weight: 102kg Age: 81 Scr: 1.18 on 01/21/22 CrCl: 73.37  Based on above findings Xarelto '20mg'$  daily is the appropriate dose.  OK to give 1 month samples if available.  Pt is past due for appt with Dr Percival Spanish but has one scheduled for 02/18/22.

## 2022-02-18 ENCOUNTER — Ambulatory Visit: Payer: PPO | Admitting: Cardiology

## 2022-02-18 ENCOUNTER — Encounter: Payer: Self-pay | Admitting: Internal Medicine

## 2022-02-27 DIAGNOSIS — E118 Type 2 diabetes mellitus with unspecified complications: Secondary | ICD-10-CM | POA: Insufficient documentation

## 2022-02-27 NOTE — Progress Notes (Signed)
Cardiology Office Note   Date:  03/01/2022   ID:  Steven Boyd 01-13-1941, MRN [id number]  PCP:  Steven School, MD  Cardiologist:   Minus Breeding, MD   Chief Complaint  Patient presents with   Atrial Fibrillation      History of Present Illness: Steven Boyd is a 81 y.o. male who presents for follow-up of atrial fibrillation.  I last saw him in 01/2020.  He has had no symptomatic tachypalpitations.  He has had no new cardiovascular complaints.  He is very hard of hearing.  He walks with a cane when he needs it but says he can walk without it.  He does a little bit of walking for exercise.  He denies any chest pressure, neck or arm discomfort.  He had no new palpitations, presyncope or syncope.  He has had no weight gain or edema.   Past Medical History:  Diagnosis Date   Diabetes mellitus without complication (HCC)    diet controlled   GERD (gastroesophageal reflux disease)    Hard of hearing    bilateral, no hearing aids   Hypertension    Prostate cancer [facility])     Past Surgical History:  Procedure Laterality Date   BIOPSY  09/23/2016   Procedure: BIOPSY;  Surgeon: Rogene Houston, MD;  [facility];  Service: Endoscopy;;  gastric   COLONOSCOPY N/A 05/08/2016   Procedure: COLONOSCOPY;  Surgeon: Rogene Houston, MD;  [facility];  Service: Endoscopy;  Laterality: N/A;  2:25-moved to 100 per Ann   ESOPHAGOGASTRODUODENOSCOPY N/A 06/07/2016   Procedure: ESOPHAGOGASTRODUODENOSCOPY (EGD);  Surgeon: Rogene Houston, MD;  [facility];  Service: Endoscopy;  Laterality: N/A;  915   ESOPHAGOGASTRODUODENOSCOPY N/A 09/23/2016   Procedure: ESOPHAGOGASTRODUODENOSCOPY (EGD);  Surgeon: Rogene Houston, MD;  [facility];  Service: Endoscopy;  Laterality: N/A;  955   FEMUR IM NAIL Left 12/24/2014   Procedure: INTRAMEDULLARY (IM) NAIL FEMORAL;  Surgeon: Marchia Bond, MD;  Location: [facility];  Service: Orthopedics;  Laterality: Left;    POLYPECTOMY  05/08/2016   Procedure: POLYPECTOMY;  Surgeon: Rogene Houston, MD;  [facility];  Service: Endoscopy;;  sigmoid colon polypectomies x3      Current Outpatient Medications  Medication Sig Dispense Refill   fluticasone (FLONASE) 50 MCG/ACT nasal spray Place 2 sprays into both nostrils daily. 16 g 0   hydrALAZINE (APRESOLINE) 50 MG tablet Take 1 tablet (50 mg total) by mouth 2 (two) times daily. KEEP UPCOMING APPOINTMENT FOR FUTURE REFILLS. 60 tablet 0   lisinopril (PRINIVIL,ZESTRIL) 40 MG tablet Take 40 mg by mouth daily.     metoprolol succinate (TOPROL-XL) 25 MG 24 hr tablet TAKE 1/2 (ONE-HALF) TABLET BY MOUTH ONCE DAILY WITH OR IMMEDIATELY FOLLOWING A MEAL 45 tablet 3   Omega-3 Fatty Acids (FISH OIL) 1200 MG CAPS Take 500 mg by mouth daily.      rivaroxaban (XARELTO) 20 MG TABS tablet Take 1 tablet (20 mg total) by mouth daily with supper. (Patient taking differently: Take 20 mg by mouth every evening.) 28 tablet 0   tamsulosin (FLOMAX) 0.4 MG CAPS capsule Take 0.4 mg by mouth every evening.      No current facility-administered medications for this visit.    Allergies:   Protonix [pantoprazole sodium] and Dilaudid [hydromorphone hcl]    ROS:  Please see the history of present illness.   Otherwise, review of systems are positive for none.  All other systems are reviewed and negative.    PHYSICAL EXAM: VS:  BP (!) 142/60 (BP Location: Left Arm, Patient Position: Sitting, Cuff Size: Normal)   Pulse (!) 48   Ht '6\' 2"'$  (1.88 m)   Wt 215 lb (97.5 kg)   SpO2 96%   BMI 27.60 kg/m  , BMI Body mass index is 27.6 kg/m. GENERAL:  Well appearing NECK:  No jugular venous distention, waveform within normal limits, carotid upstroke brisk and symmetric, no bruits, no thyromegaly LUNGS:  Clear to auscultation bilaterally CHEST:  Unremarkable HEART:  PMI not displaced or sustained,S1 and S2 within normal limits, no S3, no S4, no clicks, no rubs, no murmurs ABD:  Flat,  positive bowel sounds normal in frequency in pitch, no bruits, no rebound, no guarding, no midline pulsatile mass, no hepatomegaly, no splenomegaly EXT:  2 plus pulses throughout, no edema, no cyanosis no clubbing  EKG:  EKG is  ordered today. The ekg ordered today demonstrates sinus rhythm, rate 48, interventricular conduction delay with a left axis deviation, no acute ST-T wave changes.   Recent Labs: No results found for requested labs within last 365 days.    Lipid Panel    Component Value Date/Time   CHOL 161 05/13/2016 0837   TRIG 82 05/13/2016 0837   HDL 42 05/13/2016 0837   CHOLHDL 3.8 05/13/2016 0837   VLDL 16 05/13/2016 0837   LDLCALC 103 (H) 05/13/2016 0837      Wt Readings from Last 3 Encounters:  03/01/22 215 lb (97.5 kg)  03/21/20 225 lb (102.1 kg)  02/18/20 224 lb 12.8 oz (102 kg)      Other studies Reviewed: Additional studies/ records that were reviewed today include: Labs. Review of the above records demonstrates:  Please see elsewhere in the note.     ASSESSMENT AND PLAN:  Essential hypertension His blood pressure is controlled.  He will continue the meds as listed.   Atrial fibrillation with controlled ventricular rate ([place]) He has paroxysmal atrial fibrillation.  Has not had any symptomatic paroxysms.  We will continue him on Xarelto.  He is up-to-date with blood work including his basic metabolic profile and he is on the right dose.  He is not anemic.  Diet-controlled diabetes mellitus ([place]) A1c is 4.7  Current smoker I again encouraged complete smoking cessation but he does not think he will ever be able to give this up.  Encouraged smoking cessation.   Current medicines are reviewed at length with the patient today.  The patient does not have concerns regarding medicines.  The following changes have been made:   None  Labs/ tests ordered today include:  None   Orders Placed This Encounter  Procedures   EKG 12-Lead     Disposition:    FU with me in 12 months.     Signed, Minus Breeding, MD  03/01/2022 1:33 PM    [facility]

## 2022-03-01 ENCOUNTER — Encounter: Payer: Self-pay | Admitting: Cardiology

## 2022-03-01 ENCOUNTER — Ambulatory Visit: Payer: PPO | Attending: Cardiology | Admitting: Cardiology

## 2022-03-01 VITALS — BP 142/60 | HR 48 | Ht 74.0 in | Wt 215.0 lb

## 2022-03-01 DIAGNOSIS — E118 Type 2 diabetes mellitus with unspecified complications: Secondary | ICD-10-CM

## 2022-03-01 DIAGNOSIS — I4891 Unspecified atrial fibrillation: Secondary | ICD-10-CM | POA: Diagnosis not present

## 2022-03-01 DIAGNOSIS — I739 Peripheral vascular disease, unspecified: Secondary | ICD-10-CM

## 2022-03-01 NOTE — Patient Instructions (Signed)
Medication Instructions:  The current medical regimen is effective;  continue present plan and medications.  *If you need a refill on your cardiac medications before your next appointment, please call your pharmacy*   Follow-Up: At [facility], you and your health needs are our priority.  As part of our continuing mission to provide you with exceptional heart care, we have created designated Provider Care Teams.  These Care Teams include your primary Cardiologist (physician) and Advanced Practice Providers (APPs -  Physician Assistants and Nurse Practitioners) who all work together to provide you with the care you need, when you need it.  We recommend signing up for the patient portal called "MyChart".  Sign up information is provided on this After Visit Summary.  MyChart is used to connect with patients for Virtual Visits (Telemedicine).  Patients are able to view lab/test results, encounter notes, upcoming appointments, etc.  Non-urgent messages can be sent to your provider as well.   To learn more about what you can do with MyChart, go to [email or web address].    Your next appointment:   12 month(s)  The format for your next appointment:   In Person  Provider:   Minus Breeding, MD

## 2022-03-15 ENCOUNTER — Other Ambulatory Visit: Payer: Self-pay | Admitting: Cardiovascular Disease

## 2022-03-20 ENCOUNTER — Telehealth: Payer: Self-pay | Admitting: Cardiology

## 2022-03-20 NOTE — Telephone Encounter (Signed)
Patient calling the office for samples of medication:   1.  What medication and dosage are you requesting samples for? rivaroxaban (XARELTO) 20 MG TABS tablet   2.  Are you currently out of this medication? No    Patient is in doughnut hole. Was advised to call and request samples.

## 2022-03-21 NOTE — Telephone Encounter (Signed)
Patient advised 4 bottles of Xarelto '20mg'$  has been set aside for him. He said his daughter will come for them. Lot #: [id number]; Exp 10/25.

## 2022-03-21 NOTE — Telephone Encounter (Signed)
Yes, ok for Xarelto '20mg'$  samples

## 2022-03-26 ENCOUNTER — Ambulatory Visit: Payer: PPO | Admitting: Urology

## 2022-04-01 DIAGNOSIS — E1140 Type 2 diabetes mellitus with diabetic neuropathy, unspecified: Secondary | ICD-10-CM | POA: Diagnosis not present

## 2022-04-01 DIAGNOSIS — J0100 Acute maxillary sinusitis, unspecified: Secondary | ICD-10-CM | POA: Diagnosis not present

## 2022-04-01 DIAGNOSIS — M1991 Primary osteoarthritis, unspecified site: Secondary | ICD-10-CM | POA: Diagnosis not present

## 2022-04-01 DIAGNOSIS — J209 Acute bronchitis, unspecified: Secondary | ICD-10-CM | POA: Diagnosis not present

## 2022-04-17 ENCOUNTER — Other Ambulatory Visit: Payer: Self-pay | Admitting: Cardiovascular Disease

## 2022-05-03 ENCOUNTER — Ambulatory Visit (INDEPENDENT_AMBULATORY_CARE_PROVIDER_SITE_OTHER): Payer: PPO | Admitting: Podiatry

## 2022-05-03 ENCOUNTER — Encounter: Payer: Self-pay | Admitting: Podiatry

## 2022-05-03 DIAGNOSIS — Q828 Other specified congenital malformations of skin: Secondary | ICD-10-CM

## 2022-05-03 DIAGNOSIS — M7751 Other enthesopathy of right foot: Secondary | ICD-10-CM

## 2022-05-03 NOTE — Progress Notes (Signed)
Subjective:  Patient ID: Steven Boyd, male    DOB: 02-05-1941,  MRN: [id number]  Chief Complaint  Patient presents with   Nail Problem    Nail trim     82 y.o. male presents with the above complaint.  Bilateral submetatarsal 5 porokeratosis patient presents with painful lesion hurts with ambulation hurts with pressure.  Pain scale 7 out of 10 hurts with ambulation worse with pressure he has not seen anyone else prior to seeing me denies any other acute complaints.   Review of Systems: Negative except as noted in the HPI. Denies N/V/F/Ch.  Past Medical History:  Diagnosis Date   Diabetes mellitus without complication (HCC)    diet controlled   GERD (gastroesophageal reflux disease)    Hard of hearing    bilateral, no hearing aids   Hypertension    Prostate cancer ([place])     Current Outpatient Medications:    fluticasone (FLONASE) 50 MCG/ACT nasal spray, Place 2 sprays into both nostrils daily., Disp: 16 g, Rfl: 0   hydrALAZINE (APRESOLINE) 50 MG tablet, Take 1 tablet by mouth twice daily, Disp: 60 tablet, Rfl: 6   lisinopril (PRINIVIL,ZESTRIL) 40 MG tablet, Take 40 mg by mouth daily., Disp: , Rfl:    metoprolol succinate (TOPROL-XL) 25 MG 24 hr tablet, Take 0.5 tablets (12.5 mg total) by mouth daily., Disp: 45 tablet, Rfl: 3   Omega-3 Fatty Acids (FISH OIL) 1200 MG CAPS, Take 500 mg by mouth daily. , Disp: , Rfl:    rivaroxaban (XARELTO) 20 MG TABS tablet, Take 1 tablet (20 mg total) by mouth daily with supper. (Patient taking differently: Take 20 mg by mouth every evening.), Disp: 28 tablet, Rfl: 0   tamsulosin (FLOMAX) 0.4 MG CAPS capsule, Take 0.4 mg by mouth every evening. , Disp: , Rfl:   Social History   Tobacco Use  Smoking Status Every Day   Packs/day: 1.00   Years: 55.00   Total pack years: 55.00   Types: Cigarettes  Smokeless Tobacco Never    Allergies  Allergen Reactions   Protonix [Pantoprazole Sodium] Nausea And Vomiting    Patient states that after  taking a few doses this happened , he stopped the medication.   Dilaudid [Hydromorphone Hcl] Nausea And Vomiting   Objective:  There were no vitals filed for this visit. There is no height or weight on file to calculate BMI. Constitutional Well developed. Well nourished.  Vascular Dorsalis pedis pulses palpable bilaterally. Posterior tibial pulses palpable bilaterally. Capillary refill normal to all digits.  No cyanosis or clubbing noted. Pedal hair growth normal.  Neurologic Normal speech. Oriented to person, place, and time. Epicritic sensation to light touch grossly present bilaterally.  Dermatologic Hyperkeratotic lesion with central nucleated core noted to bilateral submetatarsal 5.  Hurts with ambulation hurts with pressures for keratotic lesion noted.  Orthopedic: Normal joint ROM without pain or crepitus bilaterally. No visible deformities. No bony tenderness.   Radiographs: None Assessment:   1. Porokeratosis   2. Capsulitis of metatarsophalangeal (MTP) joint of right foot    Plan:  Patient was evaluated and treated and all questions answered.  Bilateral submetatarsal 5 porokeratosis with underlying capsulitis to the right side -All questions and concerns were discussed with the patient in extensive detail given the amount of pain that he is having he will benefit from a debridement of the lesion using chisel blade and a handle courtesy debridement was carried out. -I discussed orthotics option. -A steroid injection was performed at right  fifth MPJ using 1% plain Lidocaine and 10 mg of Kenalog. This was well tolerated.  Visit. Return in about 3 months (around 08/02/2022).

## 2022-06-07 DIAGNOSIS — Z6827 Body mass index (BMI) 27.0-27.9, adult: Secondary | ICD-10-CM | POA: Diagnosis not present

## 2022-06-07 DIAGNOSIS — E663 Overweight: Secondary | ICD-10-CM | POA: Diagnosis not present

## 2022-06-07 DIAGNOSIS — I10 Essential (primary) hypertension: Secondary | ICD-10-CM | POA: Diagnosis not present

## 2022-06-07 DIAGNOSIS — E1129 Type 2 diabetes mellitus with other diabetic kidney complication: Secondary | ICD-10-CM | POA: Diagnosis not present

## 2022-06-07 DIAGNOSIS — E1140 Type 2 diabetes mellitus with diabetic neuropathy, unspecified: Secondary | ICD-10-CM | POA: Diagnosis not present

## 2022-06-07 DIAGNOSIS — C61 Malignant neoplasm of prostate: Secondary | ICD-10-CM | POA: Diagnosis not present

## 2022-06-07 DIAGNOSIS — I4891 Unspecified atrial fibrillation: Secondary | ICD-10-CM | POA: Diagnosis not present

## 2022-07-02 ENCOUNTER — Ambulatory Visit: Payer: PPO | Admitting: Urology

## 2022-07-31 ENCOUNTER — Ambulatory Visit (INDEPENDENT_AMBULATORY_CARE_PROVIDER_SITE_OTHER): Payer: HMO | Admitting: Podiatry

## 2022-07-31 ENCOUNTER — Encounter: Payer: Self-pay | Admitting: Podiatry

## 2022-07-31 DIAGNOSIS — D2371 Other benign neoplasm of skin of right lower limb, including hip: Secondary | ICD-10-CM

## 2022-07-31 DIAGNOSIS — M7751 Other enthesopathy of right foot: Secondary | ICD-10-CM

## 2022-07-31 DIAGNOSIS — M7752 Other enthesopathy of left foot: Secondary | ICD-10-CM

## 2022-07-31 DIAGNOSIS — B351 Tinea unguium: Secondary | ICD-10-CM

## 2022-07-31 DIAGNOSIS — D2372 Other benign neoplasm of skin of left lower limb, including hip: Secondary | ICD-10-CM | POA: Diagnosis not present

## 2022-07-31 DIAGNOSIS — M79676 Pain in unspecified toe(s): Secondary | ICD-10-CM

## 2022-07-31 MED ORDER — DEXAMETHASONE SODIUM PHOSPHATE 120 MG/30ML IJ SOLN
4.0000 mg | Freq: Once | INTRAMUSCULAR | Status: AC
  Administered 2022-07-31: 4 mg via INTRA_ARTICULAR

## 2022-07-31 NOTE — Progress Notes (Signed)
He presents today chief complaint of painful elongated toenails bilaterally he is also complaining of painful lesion beneath the fifth metatarsal of the right foot.  States that the injection last time really helped.  Objective: Vital signs stable he is alert and x 3 palpable bursitis beneath the fifth metatarsal head of the right foot with an overlying benign skin lesion.  Toenails are long thick yellow dystrophic onychomycotic severely incurvated and painful.  Assessment: Pain limb secondary to onychomycosis bursitis subfifth metatarsal head right foot and benign skin lesion.  Plan: Debridement of benign skin lesion I injected the bursa today with 2 mg of dexamethasone local anesthetic debrided nails in thickness and length.

## 2022-08-06 ENCOUNTER — Encounter: Payer: Self-pay | Admitting: Urology

## 2022-08-06 ENCOUNTER — Ambulatory Visit (INDEPENDENT_AMBULATORY_CARE_PROVIDER_SITE_OTHER): Payer: HMO | Admitting: Urology

## 2022-08-06 VITALS — BP 114/66 | HR 86 | Wt 216.8 lb

## 2022-08-06 DIAGNOSIS — C61 Malignant neoplasm of prostate: Secondary | ICD-10-CM | POA: Diagnosis not present

## 2022-08-06 DIAGNOSIS — R351 Nocturia: Secondary | ICD-10-CM | POA: Diagnosis not present

## 2022-08-06 DIAGNOSIS — N401 Enlarged prostate with lower urinary tract symptoms: Secondary | ICD-10-CM | POA: Diagnosis not present

## 2022-08-06 DIAGNOSIS — R972 Elevated prostate specific antigen [PSA]: Secondary | ICD-10-CM

## 2022-08-06 LAB — URINALYSIS, ROUTINE W REFLEX MICROSCOPIC
Bilirubin, UA: NEGATIVE
Glucose, UA: NEGATIVE
Nitrite, UA: NEGATIVE
Specific Gravity, UA: 1.015 (ref 1.005–1.030)
Urobilinogen, Ur: 0.2 mg/dL (ref 0.2–1.0)
pH, UA: 5.5 (ref 5.0–7.5)

## 2022-08-06 LAB — BLADDER SCAN AMB NON-IMAGING

## 2022-08-06 LAB — MICROSCOPIC EXAMINATION: Bacteria, UA: NONE SEEN

## 2022-08-06 NOTE — Progress Notes (Signed)
History of Present Illness:   He underwent ultrasound and biopsy of his prostate on 07/25/2015. At that time, PSA was rising and was 5.8. Prostatic volume was 117 cc. PSA density 0.05.  2/12 cores positive for adenocarcinoma (right base medial, right mid medial), both GS 3+3  both revealed 20% of the cores involved with cancer.  IPSS-10, quality of life. 2(initial)   Following consultation, the patient decided on active surveillance.   His Oncotype DX results areas follows:  GPS score 15  Likelihood of favorable pathology--92%  Likelihood of low-grade disease--95%  Likelihood of organ confined disease--96%.  SHIM- 1   07/23/2016: PSA  3.6  01/13/2017--PSA 4.4   06/23/2017: MRI of the prostate performed. This revealed a single focus of signal abnormality at the right base, PI-RADS 2 lesion.prostatic volume was measured at 87 mL. There was no evidence of trans-capsular spread, seminal vesicle involvement, neurovascular bundle involvement, pelvic adenopathy or bony metastatic process.   4.2.2019: surveillance biopsy. Prostatic volume was 96 mL. 3/12 cores revealed adenocarcinoma: Right base lateral, GS 3+3 ( 5% of core), right base medial GS 3+3 (40% of core), left base lateral GS 3+3 (10% of core).   10.8.2019: PSA 4.7. 9.21.2021: PSA 8.3   10.15.2021: MRI of prostate. 1.  PI-RADS category 4 lesion in the right para midline apical posterior peripheral zone 2.  No evidence of extracapsular extension, neurovascular bundle or seminal vesicle involvement or suspicious bone lesion. 3.  Prostate volume 88 mL   11.5.2021: Fusion biopsy.  Prostate volume 95 mL.  All 4 cores from region of interest were negative for adenocarcinoma.  5/12 cores from sextant biopsies revealed adenocarcinoma: 3 cores revealed GS 3+3 pattern in 5%, 5% and 15% of cores (left mid lateral, right mid medial, left base lateral) 2 cores revealed GS 3+4 pattern in 10 in 5% of cores (right mid lateral, right base lateral)    4.16.2024: First visit since his fusion biopsy in [place] in 2021.  He has not had recent PSA.  Reason for visit is nocturnal frequency.  He is on Flomax.  He drinks a fair amount of caffeinated beverages in the afternoon.  He does not limit fluid intake.  IPSS 7, quality-of-life score 4.  He is on tamsulosin.  Past Medical History:  Diagnosis Date   Diabetes mellitus without complication    diet controlled   GERD (gastroesophageal reflux disease)    Hard of hearing    bilateral, no hearing aids   Hypertension    Prostate cancer     Past Surgical History:  Procedure Laterality Date   BIOPSY  09/23/2016   Procedure: BIOPSY;  Surgeon: Malissa Hippo, MD;  [facility];  Service: Endoscopy;;  gastric   COLONOSCOPY N/A 05/08/2016   Procedure: COLONOSCOPY;  Surgeon: Malissa Hippo, MD;  [facility];  Service: Endoscopy;  Laterality: N/A;  2:25-moved to 100 per Ann   ESOPHAGOGASTRODUODENOSCOPY N/A 06/07/2016   Procedure: ESOPHAGOGASTRODUODENOSCOPY (EGD);  Surgeon: Malissa Hippo, MD;  [facility];  Service: Endoscopy;  Laterality: N/A;  915   ESOPHAGOGASTRODUODENOSCOPY N/A 09/23/2016   Procedure: ESOPHAGOGASTRODUODENOSCOPY (EGD);  Surgeon: Malissa Hippo, MD;  [facility];  Service: Endoscopy;  Laterality: N/A;  955   FEMUR IM NAIL Left 12/24/2014   Procedure: INTRAMEDULLARY (IM) NAIL FEMORAL;  Surgeon: Teryl Lucy, MD;  [facility];  Service: Orthopedics;  Laterality: Left;   POLYPECTOMY  05/08/2016   Procedure: POLYPECTOMY;  Surgeon: Malissa Hippo,  MD;  [facility];  Service: Endoscopy;;  sigmoid colon polypectomies x3     Home Medications:  Allergies as of 08/06/2022       Reactions   Protonix [pantoprazole Sodium] Nausea And Vomiting   Patient states that after taking a few doses this happened , he stopped the medication.   Dilaudid [hydromorphone Hcl] Nausea And Vomiting        Medication List         Accurate as of August 06, 2022  2:21 PM. If you have any questions, ask your nurse or doctor.          Fish Oil 1200 MG Caps Take 500 mg by mouth daily.   hydrALAZINE 50 MG tablet Commonly known as: APRESOLINE Take 1 tablet by mouth twice daily   lisinopril 40 MG tablet Commonly known as: ZESTRIL Take 40 mg by mouth daily.   metoprolol succinate 25 MG 24 hr tablet Commonly known as: TOPROL-XL Take 0.5 tablets (12.5 mg total) by mouth daily.   rivaroxaban 20 MG Tabs tablet Commonly known as: Xarelto Take 1 tablet (20 mg total) by mouth daily with supper. What changed: when to take this   tamsulosin 0.4 MG Caps capsule Commonly known as: FLOMAX Take 0.4 mg by mouth every evening.        Allergies:  Allergies  Allergen Reactions   Protonix [Pantoprazole Sodium] Nausea And Vomiting    Patient states that after taking a few doses this happened , he stopped the medication.   Dilaudid [Hydromorphone Hcl] Nausea And Vomiting    Family History  Problem Relation Age of Onset   Diabetes Mother    Heart Problems Father        s/p heart valve repair 10 years before he passed away    Social History:  reports that he has been smoking cigarettes. He has a 55.00 pack-year smoking history. He has never used smokeless tobacco. He reports that he does not drink alcohol and does not use drugs.  ROS: A complete review of systems was performed.  All systems are negative except for pertinent findings as noted.  Physical Exam:  Vital signs in last 24 hours: There were no vitals taken for this visit. Constitutional:  Alert and oriented, No acute distress Cardiovascular: Regular rate  Respiratory: Normal respiratory effort Neurologic: Grossly intact, no focal deficits Psychiatric: Normal mood and affect  I have reviewed prior pt notes  I have reviewed notes from referring/previous physicians  I have reviewed urinalysis results  I have independently reviewed prior  imaging--MRI, ultrasound results  I have reviewed prior PSA results  I have reviewed IPSS form   Impression/Assessment:  1.  Grade group 2 prostate cancer, on active surveillance with inadequate follow-up, no recent PSA  2.  BPH with nocturia, bothersome but I think most of his issue is from not limiting caffeinated and other beverages in the evening  Plan:  1.  PSA is checked today  2.  Gave him tips on limiting caffeine and other fluids in the evening  3.  I will see him back in 3 months for recheck

## 2022-08-07 LAB — PSA: Prostate Specific Ag, Serum: 9.7 ng/mL — ABNORMAL HIGH (ref 0.0–4.0)

## 2022-08-12 ENCOUNTER — Telehealth: Payer: Self-pay

## 2022-08-12 NOTE — Telephone Encounter (Signed)
Patient's daughter called advising that she had questions regarding recent lab results. She would like a call [phone].   Thank you

## 2022-08-12 NOTE — Telephone Encounter (Signed)
Return call to patient's daughter  Jasmine December making her aware that lab results have been sent to MD and once he review labs someone will reach out with MD recommendation. Patient's daughter Jasmine December voice understanding

## 2022-08-13 ENCOUNTER — Telehealth: Payer: Self-pay

## 2022-08-13 NOTE — Telephone Encounter (Signed)
Patient's daughter Sharon is aJasmine Boyd that the patient PSA is 9.7 and no need for further testing at this point and MD will discussed at patient follow up appointment. Patient's daughter Steven December voiced understanding.

## 2022-08-13 NOTE — Telephone Encounter (Signed)
-----   Message from Marcine Matar, MD sent at 08/13/2022  8:29 AM EDT ----- Please call-PSA about a point to 9.7.  No need to do any further testing at this point, I will discuss at his follow-up. ----- Message ----- From: Troy Sine, CMA Sent: 08/12/2022   8:19 AM EDT To: Marcine Matar, MD  Please Review

## 2022-09-10 ENCOUNTER — Ambulatory Visit: Payer: HMO | Admitting: Diagnostic Neuroimaging

## 2022-09-11 ENCOUNTER — Other Ambulatory Visit: Payer: Self-pay

## 2022-09-11 MED ORDER — HYDRALAZINE HCL 50 MG PO TABS: 50.0000 mg | ORAL_TABLET | Freq: Two times a day (BID) | ORAL | 5 refills | Status: DC

## 2022-09-12 ENCOUNTER — Ambulatory Visit: Payer: HMO | Admitting: Diagnostic Neuroimaging

## 2022-10-05 ENCOUNTER — Emergency Department (HOSPITAL_COMMUNITY): Payer: HMO

## 2022-10-05 ENCOUNTER — Other Ambulatory Visit: Payer: Self-pay

## 2022-10-05 ENCOUNTER — Encounter (HOSPITAL_COMMUNITY): Payer: Self-pay | Admitting: Emergency Medicine

## 2022-10-05 ENCOUNTER — Emergency Department (HOSPITAL_COMMUNITY)
Admission: EM | Admit: 2022-10-05 | Discharge: 2022-10-05 | Disposition: A | Discharge status: Home / Self Care | Payer: HMO | Attending: Emergency Medicine | Admitting: Emergency Medicine

## 2022-10-05 DIAGNOSIS — Z23 Encounter for immunization: Secondary | ICD-10-CM | POA: Insufficient documentation

## 2022-10-05 DIAGNOSIS — N281 Cyst of kidney, acquired: Secondary | ICD-10-CM | POA: Diagnosis not present

## 2022-10-05 DIAGNOSIS — Z043 Encounter for examination and observation following other accident: Secondary | ICD-10-CM | POA: Diagnosis not present

## 2022-10-05 DIAGNOSIS — W19XXXA Unspecified fall, initial encounter: Secondary | ICD-10-CM | POA: Diagnosis not present

## 2022-10-05 DIAGNOSIS — K5730 Diverticulosis of large intestine without perforation or abscess without bleeding: Secondary | ICD-10-CM | POA: Diagnosis not present

## 2022-10-05 DIAGNOSIS — S20212A Contusion of left front wall of thorax, initial encounter: Secondary | ICD-10-CM | POA: Insufficient documentation

## 2022-10-05 DIAGNOSIS — Z7901 Long term (current) use of anticoagulants: Secondary | ICD-10-CM | POA: Insufficient documentation

## 2022-10-05 DIAGNOSIS — S5002XA Contusion of left elbow, initial encounter: Secondary | ICD-10-CM | POA: Diagnosis not present

## 2022-10-05 DIAGNOSIS — N200 Calculus of kidney: Secondary | ICD-10-CM | POA: Diagnosis not present

## 2022-10-05 DIAGNOSIS — I10 Essential (primary) hypertension: Secondary | ICD-10-CM | POA: Diagnosis not present

## 2022-10-05 DIAGNOSIS — Z79899 Other long term (current) drug therapy: Secondary | ICD-10-CM | POA: Insufficient documentation

## 2022-10-05 DIAGNOSIS — S59902A Unspecified injury of left elbow, initial encounter: Secondary | ICD-10-CM | POA: Diagnosis present

## 2022-10-05 DIAGNOSIS — S301XXA Contusion of abdominal wall, initial encounter: Secondary | ICD-10-CM | POA: Diagnosis not present

## 2022-10-05 DIAGNOSIS — R0789 Other chest pain: Secondary | ICD-10-CM | POA: Diagnosis not present

## 2022-10-05 LAB — BASIC METABOLIC PANEL
Anion gap: 8 (ref 5–15)
BUN: 18 mg/dL (ref 8–23)
CO2: 24 mmol/L (ref 22–32)
Calcium: 9 mg/dL (ref 8.9–10.3)
Chloride: 103 mmol/L (ref 98–111)
Creatinine, Ser: 1.12 mg/dL (ref 0.61–1.24)
GFR, Estimated: >60 mL/min (ref >60)
Glucose, Bld: 96 mg/dL (ref 70–99)
Potassium: 4 mmol/L (ref 3.5–5.1)
Sodium: 135 mmol/L (ref 135–145)

## 2022-10-05 LAB — CBC
HCT: 46.3 % (ref 39.0–52.0)
Hemoglobin: 15.7 g/dL (ref 13.0–17.0)
MCH: 30.5 pg (ref 26.0–34.0)
MCHC: 33.9 g/dL (ref 30.0–36.0)
MCV: 90.1 fL (ref 80.0–100.0)
Platelets: 156 10*3/uL (ref 150–400)
RBC: 5.14 MIL/uL (ref 4.22–5.81)
RDW: 13.9 % (ref 11.5–15.5)
WBC: 9 10*3/uL (ref 4.0–10.5)
nRBC: 0 % (ref 0.0–0.2)

## 2022-10-05 MED ORDER — IOHEXOL 300 MG/ML  SOLN
100.0000 mL | Freq: Once | INTRAMUSCULAR | Status: AC | PRN
  Administered 2022-10-05: 100 mL via INTRAVENOUS

## 2022-10-05 MED ORDER — SODIUM CHLORIDE 0.9 % IV BOLUS
500.0000 mL | Freq: Once | INTRAVENOUS | Status: AC
  Administered 2022-10-05: 500 mL via INTRAVENOUS

## 2022-10-05 MED ORDER — TETANUS-DIPHTH-ACELL PERTUSSIS 5-2.5-18.5 LF-MCG/0.5 IM SUSY
0.5000 mL | PREFILLED_SYRINGE | Freq: Once | INTRAMUSCULAR | Status: AC
  Administered 2022-10-05: 0.5 mL via INTRAMUSCULAR
  Filled 2022-10-05: qty 0.5

## 2022-10-05 NOTE — Discharge Instructions (Signed)
Take Tylenol for pain.  Clean your laceration twice a day gently with soap and water.  Follow-up with your family doctor next week for recheck

## 2022-10-05 NOTE — ED Provider Notes (Signed)
[place] EMERGENCY DEPARTMENT AT [facility] Provider Note   CSN: [id number] Arrival date & time: 10/05/22  1617     History {Add pertinent medical, surgical, social history, OB history to HPI:1} Chief Complaint  Patient presents with   Steven Boyd is a 82 y.o. male.  Patient states he fell yesterday.  He is complaining of left elbow pain and left-sided chest pain.  Patient is on Xarelto.  And has a history of hypertension   Fall       Home Medications Prior to Admission medications   Medication Sig Start Date End Date Taking? Authorizing Provider  hydrALAZINE (APRESOLINE) 50 MG tablet Take 1 tablet (50 mg total) by mouth 2 (two) times daily. 09/11/22  Yes Rollene Rotunda, MD  lisinopril (PRINIVIL,ZESTRIL) 40 MG tablet Take 40 mg by mouth 2 (two) times daily. 05/16/18  Yes [provider]  metoprolol succinate (TOPROL-XL) 25 MG 24 hr tablet Take 0.5 tablets (12.5 mg total) by mouth daily. 04/17/22  Yes Rollene Rotunda, MD  Omega-3 Fatty Acids (FISH OIL) 1200 MG CAPS Take 500 mg by mouth daily.    Yes [provider]  rivaroxaban (XARELTO) 20 MG TABS tablet Take 1 tablet (20 mg total) by mouth daily with supper. Patient taking differently: Take 20 mg by mouth every evening. 09/24/16  Yes Rehman, Joline Maxcy, MD  tamsulosin (FLOMAX) 0.4 MG CAPS capsule Take 0.4 mg by mouth every evening.    Yes [provider]  Zinc 50 MG TABS Take 1 tablet by mouth daily.   Yes [provider]      Allergies    Protonix [pantoprazole sodium] and Dilaudid [hydromorphone hcl]    Review of Systems   Review of Systems  Physical Exam Updated Vital Signs BP (!) 145/90 (BP Location: Left Arm)   Pulse (!) 57   Temp 98.3 F (36.8 C) (Oral)   Resp 18   Ht 6\' 2"  (1.88 m)   Wt 99.8 kg   SpO2 95%   BMI 28.25 kg/m  Physical Exam  ED Results / Procedures / Treatments   Labs (all labs ordered are listed, but only abnormal results are  displayed) Labs Reviewed  CBC  BASIC METABOLIC PANEL    EKG None  Radiology DG Elbow Complete Left  Result Date: 10/05/2022 CLINICAL DATA:  Fall EXAM: LEFT ELBOW - COMPLETE 3+ VIEW COMPARISON:  None Available. FINDINGS: There is no evidence of fracture, dislocation, or joint effusion. There is no evidence of arthropathy or other focal bone abnormality. There is posterior elbow soft tissue swelling. No foreign body identified. IMPRESSION: Posterior elbow soft tissue swelling. No acute fracture or dislocation. Electronically Signed   By: Darliss Cheney M.D.   On: 10/05/2022 17:52   CT ABDOMEN PELVIS W CONTRAST  Result Date: 10/05/2022 CLINICAL DATA:  Trauma EXAM: CT ABDOMEN AND PELVIS WITH CONTRAST TECHNIQUE: Multidetector CT imaging of the abdomen and pelvis was performed using the standard protocol following bolus administration of intravenous contrast. RADIATION DOSE REDUCTION: This exam was performed according to the departmental dose-optimization program which includes automated exposure control, adjustment of the mA and/or kV according to patient size and/or use of iterative reconstruction technique. CONTRAST:  OMNIPAQUE IOHEXOL 300 MG/ML  SOLN COMPARISON:  02/22/2013 FINDINGS: Lower chest: There is 5 mm nodule in right middle lobe which has not changed suggesting granuloma or pleural plaque. There is calcified granuloma in right lower lobe with no interval change. There is no  focal consolidation in the lower lung fields. Coronary artery calcifications are seen. Hepatobiliary: No focal abnormalities are seen in liver. Gallbladder is unremarkable. Pancreas: No focal abnormalities are seen. Spleen: Unremarkable. Adrenals/Urinary Tract: Adrenals are unremarkable. There is no hydronephrosis. There are few left renal stones largest measuring 8 mm. There is tiny 1 mm calculus in the midportion of right kidney. There are few low-density lesions in the kidneys largest measuring 1.2 cm in the lower  pole of left kidney suggesting bilateral renal cysts. There are foci of cortical thinning in the right kidney. Urinary bladder is unremarkable. Stomach/Bowel: Stomach is unremarkable. There is no significant dilation of small bowel loops. There is fluid in the lumen of small bowel loops. Appendix is not dilated. There is no significant wall thickening in colon. Few diverticula seen in colon without signs of focal acute diverticulitis. Vascular/Lymphatic: Scattered arterial calcifications are seen. Reproductive: There is marked enlargement of prostate. There is inhomogeneous attenuation and prostate. Upper margin of enlarged prostate is projecting into the base of the bladder. Other: There is no ascites or pneumoperitoneum. Bilateral inguinal hernias containing fat are seen. Musculoskeletal: Possible hemangioma is seen in bodies of L1 and L2 vertebrae. Degenerative changes are noted with bony spurs and facet hypertrophy. There is minimal retrolisthesis at the L5-S1 level. There is previous internal fixation in the left femur. IMPRESSION: There is no evidence of intestinal obstruction or pneumoperitoneum. There is no hydronephrosis. Appendix is not dilated. There are few renal stones, more so in the left kidney. There are small bilateral renal cysts. Diverticulosis of colon without signs of focal diverticulitis. Enlarged prostate. Bilateral inguinal hernias containing fat. Aortic arteriosclerosis.  Coronary artery calcifications are seen. Other findings as described in the body of the report. Electronically Signed   By: Ernie Avena M.D.   On: 10/05/2022 17:49   DG Chest Port 1 View  Result Date: 10/05/2022 CLINICAL DATA:  Fall. EXAM: PORTABLE CHEST 1 VIEW COMPARISON:  Chest radiograph dated 02/25/2020. FINDINGS: No focal consolidation, pleural effusion, or pneumothorax. Small right lung base calcified granuloma. Stable cardiac silhouette. Osteopenia with degenerative changes of the spine. No acute osseous  pathology. IMPRESSION: No active disease. Electronically Signed   By: Elgie Collard M.D.   On: 10/05/2022 17:46    Procedures Procedures  {Document cardiac monitor, telemetry assessment procedure when appropriate:1}  Medications Ordered in ED Medications  Tdap (BOOSTRIX) injection 0.5 mL (has no administration in time range)  sodium chloride 0.9 % bolus 500 mL (0 mLs Intravenous Stopped 10/05/22 1921)  iohexol (OMNIPAQUE) 300 MG/ML solution 100 mL (100 mLs Intravenous Contrast Given 10/05/22 1655)    ED Course/ Medical Decision Making/ A&P   {   Click here for ABCD2, HEART and other calculatorsREFRESH Note before signing :1}                          Medical Decision Making Amount and/or Complexity of Data Reviewed Labs: ordered. Radiology: ordered.  Risk Prescription drug management.   Patient with contusion and abrasion to left elbow and contusion to left chest and left upper abdomen.  He will take Tylenol and follow-up with his doctor this week  {Document critical care time when appropriate:1} {Document review of labs and clinical decision tools ie heart score, Chads2Vasc2 etc:1}  {Document your independent review of radiology images, and any outside records:1} {Document your discussion with family members, caretakers, and with consultants:1} {Document social determinants of health affecting pt's care:1} {Document your decision  making why or why not admission, treatments were needed:1} Final Clinical Impression(s) / ED Diagnoses Final diagnoses:  Fall, initial encounter    Rx / DC Orders ED Discharge Orders     None

## 2022-10-05 NOTE — ED Triage Notes (Signed)
Pt via POV after fall last evening with injury to left forearm, left ribs, left lower abdomen. Pt takes thinners (xarelto) and has been bleeding continuously from a large skin tear to his left forearm; bleeding controlled on arrival and dressed with gauze, ace wrap, and towels from home. Unsure if he hit his head; denies LOC. Pt is hard of hearing. Last tetanus shot was possibly 20 years ago.

## 2022-10-22 ENCOUNTER — Ambulatory Visit: Payer: HMO | Admitting: Neurology

## 2022-11-06 ENCOUNTER — Encounter: Payer: Self-pay | Admitting: Podiatry

## 2022-11-06 ENCOUNTER — Ambulatory Visit: Payer: HMO | Admitting: Podiatry

## 2022-11-06 DIAGNOSIS — B351 Tinea unguium: Secondary | ICD-10-CM | POA: Diagnosis not present

## 2022-11-06 DIAGNOSIS — D2372 Other benign neoplasm of skin of left lower limb, including hip: Secondary | ICD-10-CM

## 2022-11-06 DIAGNOSIS — M79676 Pain in unspecified toe(s): Secondary | ICD-10-CM

## 2022-11-06 DIAGNOSIS — D2371 Other benign neoplasm of skin of right lower limb, including hip: Secondary | ICD-10-CM | POA: Diagnosis not present

## 2022-11-06 DIAGNOSIS — M7751 Other enthesopathy of right foot: Secondary | ICD-10-CM | POA: Diagnosis not present

## 2022-11-06 MED ORDER — DEXAMETHASONE SODIUM PHOSPHATE 120 MG/30ML IJ SOLN
2.0000 mg | Freq: Once | INTRAMUSCULAR | Status: AC
Start: 2022-11-06 — End: 2022-11-06
  Administered 2022-11-06: 2 mg via INTRA_ARTICULAR

## 2022-11-06 NOTE — Progress Notes (Signed)
Presents today chief complaint of painful elongated toenails and calluses plantar aspect of the foot.  Objective: Vital signs stable oriented x 3 pulses are palpable.  Painful mycotic nails 1 through 5 bilaterally elongated sharply incurvated.  Painful callus plantar aspect of the fifth metatarsal head of the right foot with overlying area of fluctuance and erythema consistent with bursitis.  Assessment: Bursitis subfifth met head right pain in limb secondary to onychomycosis and bursitis.  Also painful benign skin lesion.  Plan: Debrided benign skin lesion injected 2 mg of dexamethasone into the bursa subfifth met head right.  This should help decrease inflammation there.  Debrided toenails 1 through 5 bilateral.

## 2022-11-26 ENCOUNTER — Encounter: Payer: Self-pay | Admitting: Urology

## 2022-11-26 ENCOUNTER — Ambulatory Visit: Payer: HMO | Admitting: Urology

## 2022-11-26 VITALS — BP 145/75 | HR 67

## 2022-11-26 DIAGNOSIS — R351 Nocturia: Secondary | ICD-10-CM

## 2022-11-26 DIAGNOSIS — N401 Enlarged prostate with lower urinary tract symptoms: Secondary | ICD-10-CM

## 2022-11-26 DIAGNOSIS — C61 Malignant neoplasm of prostate: Secondary | ICD-10-CM | POA: Diagnosis not present

## 2022-11-26 DIAGNOSIS — R972 Elevated prostate specific antigen [PSA]: Secondary | ICD-10-CM

## 2022-11-26 LAB — BLADDER SCAN AMB NON-IMAGING: Scan Result: 123

## 2022-11-26 NOTE — Progress Notes (Signed)
History of Present Illness: He underwent ultrasound and biopsy of his prostate on 07/25/2015. At that time, PSA was rising and was 5.8. Prostatic volume was 117 cc. PSA density 0.05.  2/12 cores positive for adenocarcinoma (right base medial, right mid medial), both GS 3+3  both revealed 20% of the cores involved with cancer.  IPSS-10, quality of life. 2(initial)   Following consultation, the patient decided on active surveillance.   His Oncotype DX results areas follows:  GPS score 15  Likelihood of favorable pathology--92%  Likelihood of low-grade disease--95%  Likelihood of organ confined disease--96%.  SHIM- 1   07/23/2016: PSA  3.6  01/13/2017--PSA 4.4   06/23/2017: MRI of the prostate performed. This revealed a single focus of signal abnormality at the right base, PI-RADS 2 lesion.prostatic volume was measured at 87 mL. There was no evidence of trans-capsular spread, seminal vesicle involvement, neurovascular bundle involvement, pelvic adenopathy or bony metastatic process.   4.2.2019: surveillance biopsy. Prostatic volume was 96 mL. 3/12 cores revealed adenocarcinoma: Right base lateral, GS 3+3 ( 5% of core), right base medial GS 3+3 (40% of core), left base lateral GS 3+3 (10% of core).   10.8.2019: PSA 4.7. 9.21.2021: PSA 8.3   10.15.2021: MRI of prostate. 1.  PI-RADS category 4 lesion in the right para midline apical posterior peripheral zone 2.  No evidence of extracapsular extension, neurovascular bundle or seminal vesicle involvement or suspicious bone lesion. 3.  Prostate volume 88 mL   11.5.2021: Fusion biopsy.  Prostate volume 95 mL.  All 4 cores from region of interest were negative for adenocarcinoma.  5/12 cores from sextant biopsies revealed adenocarcinoma: 3 cores revealed GS 3+3 pattern in 5%, 5% and 15% of cores (left mid lateral, right mid medial, left base lateral) 2 cores revealed GS 3+4 pattern in 10 in 5% of cores (right mid lateral, right base lateral)    4.16.2024: First visit since his fusion biopsy in [place] in 2021.  He has not had recent PSA.  Reason for visit is nocturnal frequency.  He is on Flomax.  He drinks a fair amount of caffeinated beverages in the afternoon. PSA 9.7  8.6.2024: Here for routine check.  No recent PSA, at least since February.  Still having nocturia x 3-4.  Does not limit fluid intake in the evenings.  1 caffeinated drink in the afternoon.  Does not limit sodium.  Does have some swelling on his ankles.  Does complain that his scrotum hangs down low and sometimes hits the water.  Past Medical History:  Diagnosis Date   Diabetes mellitus without complication (HCC)    diet controlled   GERD (gastroesophageal reflux disease)    Hard of hearing    bilateral, no hearing aids   Hypertension    Prostate cancer [facility])     Past Surgical History:  Procedure Laterality Date   BIOPSY  09/23/2016   Procedure: BIOPSY;  Surgeon: Malissa Hippo, MD;  Location: [facility];  Service: Endoscopy;;  gastric   COLONOSCOPY N/A 05/08/2016   Procedure: COLONOSCOPY;  Surgeon: Malissa Hippo, MD;  Location: [facility];  Service: Endoscopy;  Laterality: N/A;  2:25-moved to 100 per Ann   ESOPHAGOGASTRODUODENOSCOPY N/A 06/07/2016   Procedure: ESOPHAGOGASTRODUODENOSCOPY (EGD);  Surgeon: Malissa Hippo, MD;  Location: [facility];  Service: Endoscopy;  Laterality: N/A;  915   ESOPHAGOGASTRODUODENOSCOPY N/A 09/23/2016   Procedure: ESOPHAGOGASTRODUODENOSCOPY (EGD);  Surgeon: Malissa Hippo, MD;  Location: [facility];  Service: Endoscopy;  Laterality: N/A;  955   FEMUR IM NAIL Left 12/24/2014   Procedure: INTRAMEDULLARY (IM) NAIL FEMORAL;  Surgeon: Teryl Lucy, MD;  Location: [facility] OR;  Service: Orthopedics;  Laterality: Left;   POLYPECTOMY  05/08/2016   Procedure: POLYPECTOMY;  Surgeon: Malissa Hippo, MD;  Location: [facility];  Service: Endoscopy;;  sigmoid colon polypectomies x3     Home Medications:  Allergies as of  11/26/2022       Reactions   Protonix [pantoprazole Sodium] Nausea And Vomiting   Patient states that after taking a few doses this happened , he stopped the medication.   Dilaudid [hydromorphone Hcl] Nausea And Vomiting        Medication List        Accurate as of November 26, 2022 12:09 PM. If you have any questions, ask your nurse or doctor.          Fish Oil 1200 MG Caps Take 500 mg by mouth daily.   hydrALAZINE 50 MG tablet Commonly known as: APRESOLINE Take 1 tablet (50 mg total) by mouth 2 (two) times daily.   lisinopril 40 MG tablet Commonly known as: ZESTRIL Take 40 mg by mouth 2 (two) times daily.   metoprolol succinate 25 MG 24 hr tablet Commonly known as: TOPROL-XL Take 0.5 tablets (12.5 mg total) by mouth daily.   rivaroxaban 20 MG Tabs tablet Commonly known as: Xarelto Take 1 tablet (20 mg total) by mouth daily with supper. What changed: when to take this   tamsulosin 0.4 MG Caps capsule Commonly known as: FLOMAX Take 0.4 mg by mouth every evening.   Zinc 50 MG Tabs Take 1 tablet by mouth daily.        Allergies:  Allergies  Allergen Reactions   Protonix [Pantoprazole Sodium] Nausea And Vomiting    Patient states that after taking a few doses this happened , he stopped the medication.   Dilaudid [Hydromorphone Hcl] Nausea And Vomiting    Family History  Problem Relation Age of Onset   Diabetes Mother    Heart Problems Father        s/p heart valve repair 10 years before he passed away    Social History:  reports that he has been smoking cigarettes. He has a 55 pack-year smoking history. He has never used smokeless tobacco. He reports that he does not drink alcohol and does not use drugs.  ROS: A complete review of systems was performed.  All systems are negative except for pertinent findings as noted.  Physical Exam:  Vital signs in last 24 hours: There were no vitals taken for this visit. Constitutional:  Alert and oriented, No acute  distress Cardiovascular: Regular rate  Respiratory: Normal respiratory effort GI: Abdomen is soft, nontender, nondistended, no abdominal masses. No CVAT.  Genitourinary: Testicles mildly atrophic.  No hydrocele noted. Neurologic: Grossly intact, no focal deficits Psychiatric: Normal mood and affect  I have reviewed prior pt notes  I have reviewed urinalysis results  I have independently reviewed prior imaging-prostate MRI  I have reviewed prior PSA and pathology results  Most recent CBC/CMP results reviewed    Impression/Assessment:  1.  Grade group 2 prostate cancer, on active surveillance.  Last biopsy about 3 years ago.  PSA at last visit 9.7, 3 years ago was 8.8  2.  BPH with nocturia-he does have peripheral edema which I think exacerbates his problem  Plan:  1.  I will check his PSA today  2.  Limit afternoon fluids,  limit sodium in diet, consider compression socks  3.  I will have him come back in 4 months.  At that time we will discuss follow-up MRI and probable fusion biopsy

## 2022-11-29 ENCOUNTER — Telehealth: Payer: Self-pay

## 2022-11-29 NOTE — Telephone Encounter (Signed)
-----   Message from Bertram Millard Dahlstedt sent at 11/29/2022  8:25 AM EDT ----- Please cal ppt--psa 9.4--slightly lower than @ last check--good news ----- Message ----- From: Troy Sine, CMA Sent: 11/27/2022  10:09 AM EDT To: Marcine Matar, MD  Please review

## 2022-11-29 NOTE — Telephone Encounter (Signed)
Patient/daughter is made aware of Dr. Retta Diones recommendation and voiced understanding

## 2023-01-15 ENCOUNTER — Ambulatory Visit: Payer: HMO | Admitting: Neurology

## 2023-01-28 ENCOUNTER — Telehealth: Payer: Self-pay | Admitting: Cardiology

## 2023-01-28 MED ORDER — RIVAROXABAN 20 MG PO TABS: 20.0000 mg | ORAL_TABLET | Freq: Every day | ORAL | 0 refills | Status: DC

## 2023-01-28 NOTE — Telephone Encounter (Signed)
Sample of Xarelto 20 mg tablets and patient assistance application for medication provided to pt.

## 2023-01-28 NOTE — Telephone Encounter (Signed)
Patient came in requesting sample of xarelto. Can't afford the price of the prescription $436. He is in a donut hole.

## 2023-02-05 ENCOUNTER — Encounter: Payer: Self-pay | Admitting: Podiatry

## 2023-02-05 ENCOUNTER — Ambulatory Visit (INDEPENDENT_AMBULATORY_CARE_PROVIDER_SITE_OTHER): Payer: HMO | Admitting: Podiatry

## 2023-02-05 DIAGNOSIS — M7752 Other enthesopathy of left foot: Secondary | ICD-10-CM | POA: Diagnosis not present

## 2023-02-05 DIAGNOSIS — B351 Tinea unguium: Secondary | ICD-10-CM | POA: Diagnosis not present

## 2023-02-05 DIAGNOSIS — D2372 Other benign neoplasm of skin of left lower limb, including hip: Secondary | ICD-10-CM

## 2023-02-05 DIAGNOSIS — D2371 Other benign neoplasm of skin of right lower limb, including hip: Secondary | ICD-10-CM | POA: Diagnosis not present

## 2023-02-05 DIAGNOSIS — M7751 Other enthesopathy of right foot: Secondary | ICD-10-CM | POA: Diagnosis not present

## 2023-02-05 DIAGNOSIS — M79676 Pain in unspecified toe(s): Secondary | ICD-10-CM

## 2023-02-05 MED ORDER — DEXAMETHASONE SODIUM PHOSPHATE 120 MG/30ML IJ SOLN
4.0000 mg | Freq: Once | INTRAMUSCULAR | Status: AC
Start: 2023-02-05 — End: 2023-02-05
  Administered 2023-02-05: 4 mg via INTRA_ARTICULAR

## 2023-02-05 NOTE — Progress Notes (Signed)
He presents today with a chief complaint of painful elongated toenails and multiple calluses.  Objective: Vital signs are stable he is alert oriented x 3 pulses are palpable.  Toenails are long thick yellow dystrophic like mycotic with multiple benign skin lesions plantar aspect of the forefoot bilaterally.  No open lesions or wounds are noted.  Assessment: Pain in limb secondary to onychomycosis and benign skin lesions.  Plan: Debridement of toenails 1 through 5 bilateral debridement of benign skin lesions.  Follow-up with [place] as needed.

## 2023-02-14 ENCOUNTER — Telehealth: Payer: Self-pay | Admitting: Cardiology

## 2023-02-14 MED ORDER — RIVAROXABAN 20 MG PO TABS: 20.0000 mg | ORAL_TABLET | Freq: Every day | ORAL | 1 refills | Status: DC

## 2023-02-14 NOTE — Telephone Encounter (Signed)
Xarelto 20 mg #7 tabs, lot 23MG 470,exp 09/26 given to patient  Patient assistance $85 card provider for pt to call   Patient assistance paperwork provided to patient to bring to follow up apt with Dr.Hochrein.

## 2023-02-14 NOTE — Telephone Encounter (Signed)
Pt came in office requesting Xarelto samples, states he is in the donut hole.

## 2023-03-02 NOTE — Progress Notes (Unsigned)
Cardiology Office Note:   Date:  03/05/2023  ID:  Steven Boyd, DOB May 03, 1940, MRN [id number] PCP: Elfredia Nevins, MD   HeartCare Providers Cardiologist:  Rollene Rotunda, MD {  History of Present Illness:   Steven Boyd is a 82 y.o. male  who presents for follow-up of atrial fibrillation.   He does not notice his atrial fibrillation and in fact argues with his family that he is not in it.  He sits on the porch smoking cigarettes according to his wife and his daughter.  He says he does some walking and they say he does not.  He has a stationary bike but is covered with stuff and he is not using it.  He has lower extremity swelling.  However, he does not really want to keep his feet elevated and does not watch his salt.  He does not feel his palpitations.  He is not having any chest pressure, neck or arm discomfort.  He is not having any PND or orthopnea.  ROS: As stated in the HPI and negative for all other systems.  Studies Reviewed:    EKG:   EKG Interpretation Date/Time:  Wednesday March 05 2023 11:20:08 EST Ventricular Rate:  51 PR Interval:    QRS Duration:  134 QT Interval:  464 QTC Calculation: 427 R Axis:   -7  Text Interpretation: Atrial flutter with variable A-V block Left bundle branch block When compared with ECG of 25-Feb-2020 15:23, No significant change since last tracing Confirmed by Rollene Rotunda ([id number]) on 03/05/2023 11:31:02 AM     Risk Assessment/Calculations:    CHA2DS2-VASc Score = 3   This indicates a 3.2% annual risk of stroke. The patient's score is based upon: CHF History: 0 HTN History: 1 Diabetes History: 0 Stroke History: 0 Vascular Disease History: 0 Age Score: 2 Gender Score: 0   Physical Exam:   VS:  BP 136/72 (BP Location: Left Arm, Patient Position: Sitting, Cuff Size: Normal)   Pulse (!) 51   Ht 6\' 2"  (1.88 m)   Wt 219 lb 6.4 oz (99.5 kg)   SpO2 97%   BMI 28.17 kg/m    Wt Readings from Last 3 Encounters:   03/05/23 219 lb 6.4 oz (99.5 kg)  10/05/22 220 lb (99.8 kg)  08/06/22 216 lb 12.8 oz (98.3 kg)     GEN: Well nourished, well developed in no acute distress NECK: No JVD; No carotid bruits CARDIAC: Irregular RR, no murmurs, rubs, gallops RESPIRATORY:  Clear to auscultation without rales, wheezing or rhonchi  ABDOMEN: Soft, non-tender, non-distended EXTREMITIES: Left greater than right lower extremity edema edema; No deformity, chronic venous stasis changes particular on the left  ASSESSMENT AND PLAN:   Essential hypertension His blood pressure is controlled.  No change in therapy.  Atrial fibrillation with controlled ventricular rate (HCC) He has persistent atrial fibrillation.  He is currently off of Xarelto because he is in the donut hole.  We talked him at length about this and try to get him some literature I have referred him to the [facility] pharmacy.  I have discussed at length with him the risk of stroke.  He is not really receptive much to the conversation because of the cost of the Xarelto currently.  He has been prescribed  the correct dose and is up-to-date with follow-up.  Current smoker He is not going to give up smoking.  We discussed the need for this.   Edema We talked about this and I have  prescribed 20 mg of Lasix as needed.  He understands need for compression socks to keep his feet elevated and to watch his salt.  It is unlikely he will comply.     Follow up with me in one year  Signed, Rollene Rotunda, MD

## 2023-03-05 ENCOUNTER — Encounter: Payer: Self-pay | Admitting: Cardiology

## 2023-03-05 ENCOUNTER — Telehealth: Payer: Self-pay | Admitting: Cardiology

## 2023-03-05 ENCOUNTER — Ambulatory Visit: Payer: HMO | Attending: Cardiology | Admitting: Cardiology

## 2023-03-05 VITALS — BP 136/72 | HR 51 | Ht 74.0 in | Wt 219.4 lb

## 2023-03-05 DIAGNOSIS — I480 Paroxysmal atrial fibrillation: Secondary | ICD-10-CM

## 2023-03-05 DIAGNOSIS — I1A0 Resistant hypertension: Secondary | ICD-10-CM | POA: Diagnosis not present

## 2023-03-05 MED ORDER — FUROSEMIDE 20 MG PO TABS: 20.0000 mg | ORAL_TABLET | Freq: Every day | ORAL | 3 refills | Status: AC | PRN

## 2023-03-05 MED ORDER — RIVAROXABAN 20 MG PO TABS: 20.0000 mg | ORAL_TABLET | Freq: Every day | ORAL | 0 refills | Status: DC

## 2023-03-05 MED ORDER — RIVAROXABAN 20 MG PO TABS: 20.0000 mg | ORAL_TABLET | Freq: Every day | ORAL | 11 refills | Status: DC

## 2023-03-05 NOTE — Telephone Encounter (Signed)
Pt is a Hochrein pt and he is in the donut hole and is wanting to know if we have any samples of Xarelto. He said he had an appointment in [place] today with him and they were out of the samples. Pt is wanting to know if we have any. [phone]

## 2023-03-05 NOTE — Patient Instructions (Addendum)
  Xarelto: Similar to Eliquis, but it is taken just once a day. The drug company offers a program called Xarelto with Me Coverage Gap Support. It runs from April 1-December 31. Patients in the coverage gap can get Xarelto for $89 (for 30 days) or $250 (for 90 days). They can call to enroll at: 888-XARELTO [phone])  Medication Instructions:  When you Restart Xarelto 20 mg daily- discontinue Aspirin Lasix 20 mg once daily as needed for swelling.  *If you need a refill on your cardiac medications before your next appointment, please call your pharmacy*  Follow-Up: At [facility], you and your health needs are our priority.  As part of our continuing mission to provide you with exceptional heart care, we have created designated Provider Care Teams.  These Care Teams include your primary Cardiologist (physician) and Advanced Practice Providers (APPs -  Physician Assistants and Nurse Practitioners) who all work together to provide you with the care you need, when you need it.  We recommend signing up for the patient portal called "MyChart".  Sign up information is provided on this After Visit Summary.  MyChart is used to connect with patients for Virtual Visits (Telemedicine).  Patients are able to view lab/test results, encounter notes, upcoming appointments, etc.  Non-urgent messages can be sent to your provider as well.   To learn more about what you can do with MyChart, go to [email or web address].    Your next appointment:   1 year(s)  Provider:   Rollene Rotunda, MD

## 2023-03-05 NOTE — Telephone Encounter (Signed)
Sample of Xarelto 20 mg tablets at the front desk for patient pick up along with Xarelto (AZ&ME) patient assistance application.   Patient notified and verbalized understanding.

## 2023-03-18 ENCOUNTER — Other Ambulatory Visit: Payer: Self-pay | Admitting: Cardiovascular Disease

## 2023-03-18 DIAGNOSIS — Z125 Encounter for screening for malignant neoplasm of prostate: Secondary | ICD-10-CM | POA: Diagnosis not present

## 2023-03-18 DIAGNOSIS — E782 Mixed hyperlipidemia: Secondary | ICD-10-CM | POA: Diagnosis not present

## 2023-03-18 DIAGNOSIS — M1991 Primary osteoarthritis, unspecified site: Secondary | ICD-10-CM | POA: Diagnosis not present

## 2023-03-18 DIAGNOSIS — G9332 Myalgic encephalomyelitis/chronic fatigue syndrome: Secondary | ICD-10-CM | POA: Diagnosis not present

## 2023-03-18 DIAGNOSIS — I10 Essential (primary) hypertension: Secondary | ICD-10-CM | POA: Diagnosis not present

## 2023-03-18 DIAGNOSIS — Z1331 Encounter for screening for depression: Secondary | ICD-10-CM | POA: Diagnosis not present

## 2023-03-18 DIAGNOSIS — D485 Neoplasm of uncertain behavior of skin: Secondary | ICD-10-CM | POA: Diagnosis not present

## 2023-03-18 DIAGNOSIS — E559 Vitamin D deficiency, unspecified: Secondary | ICD-10-CM | POA: Diagnosis not present

## 2023-03-18 DIAGNOSIS — E663 Overweight: Secondary | ICD-10-CM | POA: Diagnosis not present

## 2023-03-18 DIAGNOSIS — E1140 Type 2 diabetes mellitus with diabetic neuropathy, unspecified: Secondary | ICD-10-CM | POA: Diagnosis not present

## 2023-03-18 DIAGNOSIS — Z6827 Body mass index (BMI) 27.0-27.9, adult: Secondary | ICD-10-CM | POA: Diagnosis not present

## 2023-03-18 DIAGNOSIS — E1129 Type 2 diabetes mellitus with other diabetic kidney complication: Secondary | ICD-10-CM | POA: Diagnosis not present

## 2023-03-18 DIAGNOSIS — C61 Malignant neoplasm of prostate: Secondary | ICD-10-CM | POA: Diagnosis not present

## 2023-03-18 DIAGNOSIS — D518 Other vitamin B12 deficiency anemias: Secondary | ICD-10-CM | POA: Diagnosis not present

## 2023-03-18 DIAGNOSIS — I4891 Unspecified atrial fibrillation: Secondary | ICD-10-CM | POA: Diagnosis not present

## 2023-03-18 DIAGNOSIS — Z0001 Encounter for general adult medical examination with abnormal findings: Secondary | ICD-10-CM | POA: Diagnosis not present

## 2023-03-19 ENCOUNTER — Telehealth: Payer: Self-pay | Admitting: Cardiology

## 2023-03-19 MED ORDER — RIVAROXABAN 20 MG PO TABS: 20.0000 mg | ORAL_TABLET | Freq: Every day | ORAL | 3 refills | Status: DC

## 2023-03-19 NOTE — Addendum Note (Signed)
Addended by: Myriam Jacobson on: 03/19/2023 01:36 PM   Modules accepted: Orders

## 2023-03-19 NOTE — Telephone Encounter (Signed)
No samples of xarelto. Sent in a new prescription. Pt is in the donut hole.

## 2023-03-19 NOTE — Telephone Encounter (Signed)
Patient calling the office for samples of medication:   1.  What medication and dosage are you requesting samples for? rivaroxaban (XARELTO) 20 MG TABS tablet  2.  Are you currently out of this medication?   Patient's daughter states patient is in the donut hole and completely out of medication.

## 2023-03-24 ENCOUNTER — Telehealth: Payer: Self-pay | Admitting: Cardiology

## 2023-03-24 MED ORDER — RIVAROXABAN 20 MG PO TABS: 20.0000 mg | ORAL_TABLET | Freq: Every day | ORAL | 0 refills | Status: DC

## 2023-03-24 NOTE — Telephone Encounter (Signed)
Sample request for Xarelto received.  Indication: AF Last office visit: 03/05/23  Daiva Nakayama MD Weight: 99.5kg Age: 82 Scr: 1.12 on 10/05/22  Epic CrCl: 74.03  Based on above findings Xarelto 20mg  daily is the appropriate dose.  OK to provide samples if available.

## 2023-03-24 NOTE — Telephone Encounter (Signed)
Patient provided samples.

## 2023-03-24 NOTE — Telephone Encounter (Signed)
Pt came in office and said that he needs Xarelto 20 mg samples. He stated he went to his heart doctor in [place] and they do not have any samples. He also went to Dr. Sherwood Gambler and they do not have any samples.

## 2023-03-31 NOTE — Progress Notes (Signed)
History of Present Illness: He underwent ultrasound and biopsy of his prostate on 07/25/2015. At that time, PSA was rising and was 5.8. Prostatic volume was 117 cc. PSA density 0.05.  2/12 cores positive for adenocarcinoma (right base medial, right mid medial), both GS 3+3  both revealed 20% of the cores involved with cancer.  IPSS-10, quality of life. 2(initial)   Following consultation, the patient decided on active surveillance.   His Oncotype DX results areas follows:  GPS score 15  Likelihood of favorable pathology--92%  Likelihood of low-grade disease--95%  Likelihood of organ confined disease--96%.  SHIM- 1   07/23/2016: PSA  3.6  01/13/2017--PSA 4.4   06/23/2017: MRI of the prostate performed. This revealed a single focus of signal abnormality at the right base, PI-RADS 2 lesion.prostatic volume was measured at 87 mL. There was no evidence of trans-capsular spread, seminal vesicle involvement, neurovascular bundle involvement, pelvic adenopathy or bony metastatic process.   4.2.2019: surveillance biopsy. Prostatic volume was 96 mL. 3/12 cores revealed adenocarcinoma: Right base lateral, GS 3+3 ( 5% of core), right base medial GS 3+3 (40% of core), left base lateral GS 3+3 (10% of core).   10.8.2019: PSA 4.7. 9.21.2021: PSA 8.3   10.15.2021: MRI of prostate. 1.  PI-RADS category 4 lesion in the right para midline apical posterior peripheral zone 2.  No evidence of extracapsular extension, neurovascular bundle or seminal vesicle involvement or suspicious bone lesion. 3.  Prostate volume 88 mL   11.5.2021: Fusion biopsy.  Prostate volume 95 mL.  All 4 cores from region of interest were negative for adenocarcinoma.  5/12 cores from sextant biopsies revealed adenocarcinoma: 3 cores revealed GS 3+3 pattern in 5%, 5% and 15% of cores (left mid lateral, right mid medial, left base lateral) 2 cores revealed GS 3+4 pattern in 10 in 5% of cores (right mid lateral, right base lateral)    4.16.2024: First visit since his fusion biopsy in [place] in 2021.  He has not had recent PSA.  Reason for visit is nocturnal frequency.  He is on Flomax.  He drinks a fair amount of caffeinated beverages in the afternoon. PSA 9.7  8.6.2024: Still having nocturia x 3-4.  Does not limit fluid intake in the evenings.  1 caffeinated drink in the afternoon.  Does not limit sodium.  Does have some swelling on his ankles.  Does complain that his scrotum hangs down low and sometimes hits the water. PSA 9.4   12.10.2024: He is here today with his wife and daughter.  He has no specific urologic complaints.  He has had no PSAs over the past 4 months in this office.  Past Medical History:  Diagnosis Date   Diabetes mellitus without complication (HCC)    diet controlled   GERD (gastroesophageal reflux disease)    Hard of hearing    bilateral, no hearing aids   Hypertension    Prostate cancer [facility])     Past Surgical History:  Procedure Laterality Date   BIOPSY  09/23/2016   Procedure: BIOPSY;  Surgeon: Malissa Hippo, MD;  [facility];  Service: Endoscopy;;  gastric   COLONOSCOPY N/A 05/08/2016   Procedure: COLONOSCOPY;  Surgeon: Malissa Hippo, MD;  [facility];  Service: Endoscopy;  Laterality: N/A;  2:25-moved to 100 per Ann   ESOPHAGOGASTRODUODENOSCOPY N/A 06/07/2016   Procedure: ESOPHAGOGASTRODUODENOSCOPY (EGD);  Surgeon: Malissa Hippo, MD;  [facility];  Service: Endoscopy;  Laterality: N/A;  915   ESOPHAGOGASTRODUODENOSCOPY  N/A 09/23/2016   Procedure: ESOPHAGOGASTRODUODENOSCOPY (EGD);  Surgeon: Malissa Hippo, MD;  [facility];  Service: Endoscopy;  Laterality: N/A;  955   FEMUR IM NAIL Left 12/24/2014   Procedure: INTRAMEDULLARY (IM) NAIL FEMORAL;  Surgeon: Teryl Lucy, MD;  [facility]: [facility];  Service: Orthopedics;  Laterality: Left;   POLYPECTOMY  05/08/2016   Procedure: POLYPECTOMY;  Surgeon: Malissa Hippo, MD;  [facility];  Service: Endoscopy;;  sigmoid colon polypectomies x3     Home Medications:  Allergies as of 04/01/2023       Reactions   Protonix [pantoprazole Sodium] Nausea And Vomiting   Patient states that after taking a few doses this happened , he stopped the medication.   Dilaudid [hydromorphone Hcl] Nausea And Vomiting        Medication List        Accurate as of March 31, 2023 12:36 PM. If you have any questions, ask your nurse or doctor.          Fish Oil 1200 MG Caps Take 500 mg by mouth daily.   furosemide 20 MG tablet Commonly known as: LASIX Take 1 tablet (20 mg total) by mouth daily as needed for fluid or edema (May take 20 mg daily as needed for swelling).   hydrALAZINE 50 MG tablet Commonly known as: APRESOLINE Take 1 tablet by mouth twice daily   lisinopril 40 MG tablet Commonly known as: ZESTRIL Take 40 mg by mouth 2 (two) times daily.   metoprolol succinate 25 MG 24 hr tablet Commonly known as: TOPROL-XL Take 0.5 tablets (12.5 mg total) by mouth daily.   rivaroxaban 20 MG Tabs tablet Commonly known as: Xarelto Take 1 tablet (20 mg total) by mouth daily with supper.   rivaroxaban 20 MG Tabs tablet Commonly known as: XARELTO Take 1 tablet (20 mg total) by mouth daily with supper.   tamsulosin 0.4 MG Caps capsule Commonly known as: FLOMAX Take 0.4 mg by mouth every evening.   Zinc 50 MG Tabs Take 1 tablet by mouth daily.        Allergies:  Allergies  Allergen Reactions   Protonix [Pantoprazole Sodium] Nausea And Vomiting    Patient states that after taking a few doses this happened , he stopped the medication.   Dilaudid [Hydromorphone Hcl] Nausea And Vomiting    Family History  Problem Relation Age of Onset   Diabetes Mother    Heart Problems Father        s/p heart valve repair 10 years before he passed away    Social History:  reports that he has been smoking cigarettes. He has a 55 pack-year smoking history. He has never used  smokeless tobacco. He reports that he does not drink alcohol and does not use drugs.  ROS: A complete review of systems was performed.  All systems are negative except for pertinent findings as noted.  Physical Exam:  Vital signs in last 24 hours: There were no vitals taken for this visit. Constitutional:  Alert and oriented, No acute distress Cardiovascular: Regular rate  Respiratory: Normal respiratory effort Neurologic: Grossly intact, no focal deficits Psychiatric: Normal mood and affect  I have reviewed prior pt notes  I have reviewed urinalysis results  I have independently reviewed prior imaging-prostate MRI, ultrasound volume  I have reviewed prior PSA and pathology results  Most recent CBC/BMP results reviewed    Impression/Assessment:  1.  Grade group 2 prostate cancer, on active surveillance.  Last  biopsy just over 3 years ago.  PSA at last visit 9.4  2.  BPH with nocturia-stable Plan:  1.  I would suggest that we perform another prostate MRI and, if abnormality, fusion biopsy  2.  I will call with MRI results and further management plan

## 2023-04-01 ENCOUNTER — Ambulatory Visit: Payer: HMO | Admitting: Urology

## 2023-04-01 VITALS — BP 156/76 | HR 73

## 2023-04-01 DIAGNOSIS — N401 Enlarged prostate with lower urinary tract symptoms: Secondary | ICD-10-CM | POA: Diagnosis not present

## 2023-04-01 DIAGNOSIS — C61 Malignant neoplasm of prostate: Secondary | ICD-10-CM | POA: Diagnosis not present

## 2023-04-01 DIAGNOSIS — R351 Nocturia: Secondary | ICD-10-CM | POA: Diagnosis not present

## 2023-04-01 LAB — MICROSCOPIC EXAMINATION
Bacteria, UA: NONE SEEN
RBC, Urine: >30 /[HPF] — AB (ref 0–2)

## 2023-04-01 LAB — URINALYSIS, ROUTINE W REFLEX MICROSCOPIC
Bilirubin, UA: NEGATIVE
Glucose, UA: NEGATIVE
Ketones, UA: NEGATIVE
Leukocytes,UA: NEGATIVE
Nitrite, UA: NEGATIVE
Specific Gravity, UA: 1.02 (ref 1.005–1.030)
Urobilinogen, Ur: 0.2 mg/dL (ref 0.2–1.0)
pH, UA: 6 (ref 5.0–7.5)

## 2023-04-07 ENCOUNTER — Encounter: Payer: Self-pay | Admitting: Urology

## 2023-04-29 ENCOUNTER — Ambulatory Visit (HOSPITAL_COMMUNITY): Admission: RE | Admit: 2023-04-29 | Payer: HMO | Source: Ambulatory Visit

## 2023-04-29 ENCOUNTER — Other Ambulatory Visit: Payer: Self-pay | Admitting: Cardiology

## 2023-05-14 ENCOUNTER — Ambulatory Visit: Payer: HMO | Admitting: Podiatry

## 2023-05-19 ENCOUNTER — Emergency Department (HOSPITAL_COMMUNITY)
Admission: EM | Admit: 2023-05-19 | Discharge: 2023-05-19 | Disposition: A | Discharge status: Home / Self Care | Payer: HMO | Attending: Emergency Medicine | Admitting: Emergency Medicine

## 2023-05-19 ENCOUNTER — Other Ambulatory Visit: Payer: Self-pay

## 2023-05-19 ENCOUNTER — Encounter (HOSPITAL_COMMUNITY): Payer: Self-pay

## 2023-05-19 DIAGNOSIS — R112 Nausea with vomiting, unspecified: Secondary | ICD-10-CM | POA: Diagnosis not present

## 2023-05-19 DIAGNOSIS — R197 Diarrhea, unspecified: Secondary | ICD-10-CM | POA: Diagnosis not present

## 2023-05-19 DIAGNOSIS — I10 Essential (primary) hypertension: Secondary | ICD-10-CM | POA: Diagnosis not present

## 2023-05-19 DIAGNOSIS — Z8546 Personal history of malignant neoplasm of prostate: Secondary | ICD-10-CM | POA: Insufficient documentation

## 2023-05-19 DIAGNOSIS — Z7901 Long term (current) use of anticoagulants: Secondary | ICD-10-CM | POA: Insufficient documentation

## 2023-05-19 DIAGNOSIS — Z79899 Other long term (current) drug therapy: Secondary | ICD-10-CM | POA: Insufficient documentation

## 2023-05-19 DIAGNOSIS — E119 Type 2 diabetes mellitus without complications: Secondary | ICD-10-CM | POA: Insufficient documentation

## 2023-05-19 LAB — URINALYSIS, ROUTINE W REFLEX MICROSCOPIC
Bacteria, UA: NONE SEEN
Bilirubin Urine: NEGATIVE
Glucose, UA: NEGATIVE mg/dL
Hgb urine dipstick: NEGATIVE
Ketones, ur: NEGATIVE mg/dL
Leukocytes,Ua: NEGATIVE
Nitrite: NEGATIVE
Protein, ur: 100 mg/dL — AB
Specific Gravity, Urine: 1.025 (ref 1.005–1.030)
pH: 5 (ref 5.0–8.0)

## 2023-05-19 LAB — CBC WITH DIFFERENTIAL/PLATELET
Abs Immature Granulocytes: 0.02 10*3/uL (ref 0.00–0.07)
Basophils Absolute: 0 10*3/uL (ref 0.0–0.1)
Basophils Relative: 0 %
Eosinophils Absolute: 0 10*3/uL (ref 0.0–0.5)
Eosinophils Relative: 0 %
HCT: 48.3 % (ref 39.0–52.0)
Hemoglobin: 16 g/dL (ref 13.0–17.0)
Immature Granulocytes: 0 %
Lymphocytes Relative: 6 %
Lymphs Abs: 0.3 10*3/uL — ABNORMAL LOW (ref 0.7–4.0)
MCH: 29.8 pg (ref 26.0–34.0)
MCHC: 33.1 g/dL (ref 30.0–36.0)
MCV: 89.9 fL (ref 80.0–100.0)
Monocytes Absolute: 0.3 10*3/uL (ref 0.1–1.0)
Monocytes Relative: 5 %
Neutro Abs: 5 10*3/uL (ref 1.7–7.7)
Neutrophils Relative %: 89 %
Platelets: 153 10*3/uL (ref 150–400)
RBC: 5.37 MIL/uL (ref 4.22–5.81)
RDW: 14.4 % (ref 11.5–15.5)
WBC: 5.6 10*3/uL (ref 4.0–10.5)
nRBC: 0 % (ref 0.0–0.2)

## 2023-05-19 LAB — COMPREHENSIVE METABOLIC PANEL
ALT: 20 U/L (ref 0–44)
AST: 20 U/L (ref 15–41)
Albumin: 3.9 g/dL (ref 3.5–5.0)
Alkaline Phosphatase: 83 U/L (ref 38–126)
Anion gap: 8 (ref 5–15)
BUN: 23 mg/dL (ref 8–23)
CO2: 26 mmol/L (ref 22–32)
Calcium: 8.7 mg/dL — ABNORMAL LOW (ref 8.9–10.3)
Chloride: 103 mmol/L (ref 98–111)
Creatinine, Ser: 1.18 mg/dL (ref 0.61–1.24)
GFR, Estimated: >60 mL/min (ref >60)
Glucose, Bld: 111 mg/dL — ABNORMAL HIGH (ref 70–99)
Potassium: 3.7 mmol/L (ref 3.5–5.1)
Sodium: 137 mmol/L (ref 135–145)
Total Bilirubin: 1.4 mg/dL — ABNORMAL HIGH (ref 0.0–1.2)
Total Protein: 7.2 g/dL (ref 6.5–8.1)

## 2023-05-19 MED ORDER — ONDANSETRON 4 MG PO TBDP: 4.0000 mg | ORAL_TABLET | Freq: Three times a day (TID) | ORAL | 0 refills | Status: AC | PRN

## 2023-05-19 MED ORDER — LOPERAMIDE HCL 2 MG PO CAPS: 2.0000 mg | ORAL_CAPSULE | Freq: Four times a day (QID) | ORAL | 0 refills | Status: AC | PRN

## 2023-05-19 MED ORDER — ONDANSETRON 4 MG PO TBDP
4.0000 mg | ORAL_TABLET | Freq: Once | ORAL | Status: AC
  Administered 2023-05-19: 4 mg via ORAL
  Filled 2023-05-19: qty 1

## 2023-05-19 MED ORDER — LOPERAMIDE HCL 2 MG PO CAPS
2.0000 mg | ORAL_CAPSULE | Freq: Once | ORAL | Status: AC
  Administered 2023-05-19: 2 mg via ORAL
  Filled 2023-05-19: qty 1

## 2023-05-19 NOTE — ED Provider Notes (Signed)
[place] EMERGENCY DEPARTMENT AT [facility] Provider Note   CSN: [id number] Arrival date & time: 05/19/23  0347     History  Chief Complaint  Patient presents with   Emesis    Steven Boyd is a 83 y.o. male.   Emesis Patient presents with nausea and vomiting and diarrhea.  Reportedly is been passing around the family.  Has had some dark stool.  States has been drinking water.  Has had 2 and half water bottle since 8:00 this morning.  No blood in the emesis.  He is on Xarelto.    Past Medical History:  Diagnosis Date   Diabetes mellitus without complication (HCC)    diet controlled   GERD (gastroesophageal reflux disease)    Hard of hearing    bilateral, no hearing aids   Hypertension    Prostate cancer (HCC)     Home Medications Prior to Admission medications   Medication Sig Start Date End Date Taking? Authorizing Provider  loperamide (IMODIUM) 2 MG capsule Take 1 capsule (2 mg total) by mouth 4 (four) times daily as needed for diarrhea or loose stools. 05/19/23  Yes Benjiman Core, MD  ondansetron (ZOFRAN-ODT) 4 MG disintegrating tablet Take 1 tablet (4 mg total) by mouth every 8 (eight) hours as needed for nausea or vomiting. 05/19/23  Yes Benjiman Core, MD  furosemide (LASIX) 20 MG tablet Take 1 tablet (20 mg total) by mouth daily as needed for fluid or edema (May take 20 mg daily as needed for swelling). 03/05/23   Rollene Rotunda, MD  hydrALAZINE (APRESOLINE) 50 MG tablet Take 1 tablet by mouth twice daily 03/18/23   Rollene Rotunda, MD  lisinopril (PRINIVIL,ZESTRIL) 40 MG tablet Take 40 mg by mouth 2 (two) times daily. 05/16/18   [provider]  metoprolol succinate (TOPROL-XL) 25 MG 24 hr tablet Take 1/2 (one-half) tablet by mouth once daily 04/29/23   Rollene Rotunda, MD  Omega-3 Fatty Acids (FISH OIL) 1200 MG CAPS Take 500 mg by mouth daily.     [provider]  rivaroxaban (XARELTO) 20 MG TABS tablet Take 1 tablet (20 mg  total) by mouth daily with supper. 03/19/23   Rollene Rotunda, MD  rivaroxaban (XARELTO) 20 MG TABS tablet Take 1 tablet (20 mg total) by mouth daily with supper. 03/24/23   Rollene Rotunda, MD  tamsulosin (FLOMAX) 0.4 MG CAPS capsule Take 0.4 mg by mouth every evening.     [provider]  Zinc 50 MG TABS Take 1 tablet by mouth daily.    [provider]      Allergies    Protonix [pantoprazole sodium] and Dilaudid [hydromorphone hcl]    Review of Systems   Review of Systems  Gastrointestinal:  Positive for vomiting.    Physical Exam Updated Vital Signs BP 126/78   Pulse 70   Temp 98.1 F (36.7 C) (Oral)   Resp 15   Ht 6\' 2"  (1.88 m)   Wt 99.5 kg   SpO2 95% Comment: while ambulating  BMI 28.16 kg/m  Physical Exam Vitals and nursing note reviewed.  Cardiovascular:     Rate and Rhythm: Normal rate.  Abdominal:     Tenderness: There is no abdominal tenderness.  Skin:    Capillary Refill: Capillary refill takes less than 2 seconds.  Neurological:     Mental Status: He is alert and oriented to person, place, and time.     ED Results / Procedures / Treatments   Labs (all  labs ordered are listed, but only abnormal results are displayed) Labs Reviewed  COMPREHENSIVE METABOLIC PANEL - Abnormal; Notable for the following components:      Result Value   Glucose, Bld 111 (*)    Calcium 8.7 (*)    Total Bilirubin 1.4 (*)    All other components within normal limits  CBC WITH DIFFERENTIAL/PLATELET - Abnormal; Notable for the following components:   Lymphs Abs 0.3 (*)    All other components within normal limits  URINALYSIS, ROUTINE W REFLEX MICROSCOPIC - Abnormal; Notable for the following components:   Color, Urine AMBER (*)    Protein, ur 100 (*)    All other components within normal limits    EKG None  Radiology No results found.  Procedures Procedures    Medications Ordered in ED Medications  ondansetron (ZOFRAN-ODT) disintegrating tablet 4  mg (4 mg Oral Given 05/19/23 1122)  loperamide (IMODIUM) capsule 2 mg (2 mg Oral Given 05/19/23 1122)    ED Course/ Medical Decision Making/ A&P                                 Medical Decision Making Amount and/or Complexity of Data Reviewed Labs: ordered.  Risk Prescription drug management.   Patient with nausea vomiting diarrhea.  Likely infectious.  Family members have had similar symptoms.  Well-appearing.  Has tolerated some liquids of water.  Will give antiemetics and some Imodium.  Blood work reviewed and overall reassuring.  Has tolerated orals.  No further vomiting or diarrhea at this time.  Appears stable for discharge home.  Will treat was Imodium and Zofran for home.  Appears stable for discharge.        Final Clinical Impression(s) / ED Diagnoses Final diagnoses:  Nausea vomiting and diarrhea    Rx / DC Orders ED Discharge Orders          Ordered    loperamide (IMODIUM) 2 MG capsule  4 times daily PRN        05/19/23 1403    ondansetron (ZOFRAN-ODT) 4 MG disintegrating tablet  Every 8 hours PRN        05/19/23 1403              Benjiman Core, MD 05/19/23 1404

## 2023-05-19 NOTE — ED Triage Notes (Signed)
Pt arrived via POV from home c/o N/V/D that began yesterday. Pt reports his grandson recently had same symptoms. Pt reports dark emesis at home, unsure if blood was present.

## 2023-05-19 NOTE — ED Notes (Signed)
Pt sleeping at this time.

## 2023-05-19 NOTE — ED Notes (Signed)
Pt ambulated and O2 sats maintained 92-97% on room air.

## 2023-05-21 ENCOUNTER — Ambulatory Visit: Payer: HMO | Admitting: Podiatry

## 2023-06-03 ENCOUNTER — Ambulatory Visit (HOSPITAL_COMMUNITY): Payer: HMO

## 2023-06-16 ENCOUNTER — Encounter: Payer: Self-pay | Admitting: Podiatry

## 2023-06-16 ENCOUNTER — Ambulatory Visit: Payer: HMO | Admitting: Podiatry

## 2023-06-16 DIAGNOSIS — M79676 Pain in unspecified toe(s): Secondary | ICD-10-CM

## 2023-06-16 DIAGNOSIS — D2371 Other benign neoplasm of skin of right lower limb, including hip: Secondary | ICD-10-CM

## 2023-06-16 DIAGNOSIS — B351 Tinea unguium: Secondary | ICD-10-CM

## 2023-06-16 DIAGNOSIS — D2372 Other benign neoplasm of skin of left lower limb, including hip: Secondary | ICD-10-CM

## 2023-06-17 NOTE — Progress Notes (Signed)
 He presents today chief complaint of painful elongated toenails bilateral.  States that he has some painful calluses to.  Objective: Vital signs stable alert oriented x 3 pulses are palpable.  Toenails are long thick yellow dystrophic onychomycotic sharply incurvated multiple benign skin lesions plantar aspect of forefoot bilaterally.  Assessment: Pain limb secondary onychomycosis porokeratosis benign skin lesions.  Plan: Debridement of all benign skin lesions debridement of porokeratotic lesions debridement of toenails 1 through 5 bilateral.

## 2023-07-31 ENCOUNTER — Ambulatory Visit (HOSPITAL_COMMUNITY): Payer: HMO

## 2023-08-04 ENCOUNTER — Ambulatory Visit (HOSPITAL_COMMUNITY)
Admission: RE | Admit: 2023-08-04 | Discharge: 2023-08-04 | Disposition: A | Discharge status: Home / Self Care | Source: Ambulatory Visit | Attending: Urology | Admitting: Urology

## 2023-08-04 DIAGNOSIS — C61 Malignant neoplasm of prostate: Secondary | ICD-10-CM | POA: Diagnosis not present

## 2023-08-04 DIAGNOSIS — N4289 Other specified disorders of prostate: Secondary | ICD-10-CM | POA: Diagnosis not present

## 2023-08-04 MED ORDER — GADOBUTROL 1 MMOL/ML IV SOLN
10.0000 mL | Freq: Once | INTRAVENOUS | Status: AC | PRN
  Administered 2023-08-04: 10 mL via INTRAVENOUS

## 2023-08-05 ENCOUNTER — Telehealth: Payer: Self-pay | Admitting: Urology

## 2023-08-05 NOTE — Telephone Encounter (Signed)
 Wants to talk to someone about results for MRI

## 2023-08-05 NOTE — Telephone Encounter (Signed)
 FYI and advise

## 2023-08-07 NOTE — Telephone Encounter (Signed)
 Daughter called again wanting results

## 2023-08-07 NOTE — Telephone Encounter (Signed)
 Pt daughter called and notified that results just came in yesterday the MD will be made aware and review at his earliest convenience. Will give daughter a c/b w/ results or to schedule possible OV

## 2023-08-13 NOTE — Telephone Encounter (Signed)
 Pt scheduled for psa labs 07/01 and OV 07/08

## 2023-10-08 ENCOUNTER — Ambulatory Visit: Payer: HMO | Admitting: Podiatry

## 2023-10-21 ENCOUNTER — Other Ambulatory Visit

## 2023-10-21 ENCOUNTER — Other Ambulatory Visit: Payer: Self-pay

## 2023-10-21 DIAGNOSIS — R972 Elevated prostate specific antigen [PSA]: Secondary | ICD-10-CM | POA: Diagnosis not present

## 2023-10-22 LAB — PSA: Prostate Specific Ag, Serum: 6.9 ng/mL — ABNORMAL HIGH (ref 0.0–4.0)

## 2023-10-27 NOTE — Progress Notes (Signed)
 History of Present Illness: He underwent ultrasound and biopsy of his prostate on 07/25/2015. At that time, PSA was rising and was 5.8. Prostatic volume was 117 cc. PSA density 0.05.  2/12 cores positive for adenocarcinoma (right base medial, right mid medial), both GS 3+3  both revealed 20% of the cores involved with cancer.  IPSS-10, quality of life. 2(initial)   Following consultation, the patient decided on active surveillance.   His Oncotype DX results areas follows:  GPS score 15  Likelihood of favorable pathology--92%  Likelihood of low-grade disease--95%  Likelihood of organ confined disease--96%.  SHIM- 1   07/23/2016: PSA  3.6  01/13/2017--PSA 4.4   06/23/2017: MRI of the prostate performed. This revealed a single focus of signal abnormality at the right base, PI-RADS 2 lesion.prostatic volume was measured at 87 mL. There was no evidence of trans-capsular spread, seminal vesicle involvement, neurovascular bundle involvement, pelvic adenopathy or bony metastatic process.   4.2.2019: surveillance biopsy. Prostatic volume was 96 mL. 3/12 cores revealed adenocarcinoma: Right base lateral, GS 3+3 ( 5% of core), right base medial GS 3+3 (40% of core), left base lateral GS 3+3 (10% of core).   10.8.2019: PSA 4.7. 9.21.2021: PSA 8.3   10.15.2021: MRI of prostate. 1.  PI-RADS category 4 lesion in the right para midline apical posterior peripheral zone 2.  No evidence of extracapsular extension, neurovascular bundle or seminal vesicle involvement or suspicious bone lesion. 3.  Prostate volume 88 mL   11.5.2021: Fusion biopsy.  Prostate volume 95 mL.  All 4 cores from region of interest were negative for adenocarcinoma.  5/12 cores from sextant biopsies revealed adenocarcinoma: 3 cores revealed GS 3+3 pattern in 5%, 5% and 15% of cores (left mid lateral, right mid medial, left base lateral) 2 cores revealed GS 3+4 pattern in 10 in 5% of cores (right mid lateral, right base lateral)    4.16.2024: First visit since his fusion biopsy in [place] in 2021.  He has not had recent PSA.  Reason for visit is nocturnal frequency.  He is on Flomax.  He drinks a fair amount of caffeinated beverages in the afternoon. PSA 9.7  8.6.2024: Still having nocturia x 3-4.  Does not limit fluid intake in the evenings.  1 caffeinated drink in the afternoon.  Does not limit sodium.  Does have some swelling on his ankles.  Does complain that his scrotum hangs down low and sometimes hits the water . PSA 9.4  4.14.2025: MRI prostate.  Prostate volume 112 mL.  8 mm PI-RADS category 4 lesion in right peripheral zone.  No extra prostatic abnormalities  7.8.2025: Here today for recheck.  Most recent PSA 6.9.he states that he has been voiding well.  His daughter Reena says that he has had more issues with cognitive function recently.  Past Medical History:  Diagnosis Date   Diabetes mellitus without complication (HCC)    diet controlled   GERD (gastroesophageal reflux disease)    Hard of hearing    bilateral, no hearing aids   Hypertension    Prostate cancer [facility])     Past Surgical History:  Procedure Laterality Date   BIOPSY  09/23/2016   Procedure: BIOPSY;  Surgeon: Golda Claudis PENNER, MD;  [facility];  Service: Endoscopy;;  gastric   COLONOSCOPY N/A 05/08/2016   Procedure: COLONOSCOPY;  Surgeon: Claudis PENNER Golda, MD;  [facility];  Service: Endoscopy;  Laterality: N/A;  2:25-moved to 100 per Ann   ESOPHAGOGASTRODUODENOSCOPY N/A 06/07/2016  Procedure: ESOPHAGOGASTRODUODENOSCOPY (EGD);  Surgeon: Claudis RAYMOND Rivet, MD;  [facility];  Service: Endoscopy;  Laterality: N/A;  915   ESOPHAGOGASTRODUODENOSCOPY N/A 09/23/2016   Procedure: ESOPHAGOGASTRODUODENOSCOPY (EGD);  Surgeon: Rivet Claudis RAYMOND, MD;  [facility];  Service: Endoscopy;  Laterality: N/A;  955   FEMUR IM NAIL Left 12/24/2014   Procedure: INTRAMEDULLARY (IM) NAIL FEMORAL;  Surgeon: Fonda Olmsted,  MD;  [facility]: [facility];  Service: Orthopedics;  Laterality: Left;   POLYPECTOMY  05/08/2016   Procedure: POLYPECTOMY;  Surgeon: Claudis RAYMOND Rivet, MD;  [facility];  Service: Endoscopy;;  sigmoid colon polypectomies x3     Home Medications:  Allergies as of 10/28/2023       Reactions   Protonix  [pantoprazole  Sodium] Nausea And Vomiting   Patient states that after taking a few doses this happened , he stopped the medication.   Dilaudid  [hydromorphone  Hcl] Nausea And Vomiting        Medication List        Accurate as of October 27, 2023 12:04 PM. If you have any questions, ask your nurse or doctor.          Fish Oil 1200 MG Caps Take 500 mg by mouth daily.   furosemide  20 MG tablet Commonly known as: LASIX  Take 1 tablet (20 mg total) by mouth daily as needed for fluid or edema (May take 20 mg daily as needed for swelling).   hydrALAZINE  50 MG tablet Commonly known as: APRESOLINE  Take 1 tablet by mouth twice daily   lisinopril  40 MG tablet Commonly known as: ZESTRIL  Take 40 mg by mouth 2 (two) times daily.   loperamide  2 MG capsule Commonly known as: IMODIUM  Take 1 capsule (2 mg total) by mouth 4 (four) times daily as needed for diarrhea or loose stools.   metoprolol  succinate 25 MG 24 hr tablet Commonly known as: TOPROL -XL Take 1/2 (one-half) tablet by mouth once daily   ondansetron  4 MG disintegrating tablet Commonly known as: ZOFRAN -ODT Take 1 tablet (4 mg total) by mouth every 8 (eight) hours as needed for nausea or vomiting.   rivaroxaban  20 MG Tabs tablet Commonly known as: Xarelto  Take 1 tablet (20 mg total) by mouth daily with supper.   rivaroxaban  20 MG Tabs tablet Commonly known as: XARELTO  Take 1 tablet (20 mg total) by mouth daily with supper.   tamsulosin 0.4 MG Caps capsule Commonly known as: FLOMAX Take 0.4 mg by mouth every evening.   Zinc 50 MG Tabs Take 1 tablet by mouth daily.        Allergies:  Allergies  Allergen Reactions    Protonix  [Pantoprazole  Sodium] Nausea And Vomiting    Patient states that after taking a few doses this happened , he stopped the medication.   Dilaudid  [Hydromorphone  Hcl] Nausea And Vomiting    Family History  Problem Relation Age of Onset   Diabetes Mother    Heart Problems Father        s/p heart valve repair 10 years before he passed away    Social History:  reports that he has been smoking cigarettes. He has a 55 pack-year smoking history. He has never used smokeless tobacco. He reports that he does not drink alcohol and does not use drugs.  ROS: A complete review of systems was performed.  All systems are negative except for pertinent findings as noted.  Physical Exam:  Vital signs in last 24 hours: There were no vitals taken for this visit.  Constitutional:  Alert and oriented, No acute distress Cardiovascular: Regular rate  Respiratory: Normal respiratory effort Neurologic: Grossly intact, no focal deficits Psychiatric: Normal mood and affect  I have reviewed prior pt notes  I have reviewed urinalysis results  I have independently reviewed prior imaging-prostate MRI from April of this year  I have reviewed both prior PSA and pathology results  Most recent CBC/BMP results reviewed    Impression/Assessment:  1.  Grade group 2 prostate cancer, on active surveillance.  Last biopsy just over 3 years ago.  PSA most recently 6.9  2.  BPH with nocturia-stable.  On tamsulosin Plan:  1.  At this point, we will continue routine follow-up with PSAs every 6 months  2.  Continue tamsulosin

## 2023-10-28 ENCOUNTER — Ambulatory Visit: Admitting: Urology

## 2023-10-28 VITALS — BP 131/71 | HR 58

## 2023-10-28 DIAGNOSIS — R351 Nocturia: Secondary | ICD-10-CM | POA: Diagnosis not present

## 2023-10-28 DIAGNOSIS — C61 Malignant neoplasm of prostate: Secondary | ICD-10-CM

## 2023-10-28 DIAGNOSIS — N401 Enlarged prostate with lower urinary tract symptoms: Secondary | ICD-10-CM | POA: Diagnosis not present

## 2023-10-28 LAB — URINALYSIS, ROUTINE W REFLEX MICROSCOPIC
Bilirubin, UA: NEGATIVE
Glucose, UA: NEGATIVE
Ketones, UA: NEGATIVE
Leukocytes,UA: NEGATIVE
Nitrite, UA: NEGATIVE
Protein,UA: NEGATIVE
RBC, UA: NEGATIVE
Specific Gravity, UA: 1.015 (ref 1.005–1.030)
Urobilinogen, Ur: 0.2 mg/dL (ref 0.2–1.0)
pH, UA: 6 (ref 5.0–7.5)

## 2023-11-12 ENCOUNTER — Ambulatory Visit: Admitting: Podiatry

## 2023-11-26 ENCOUNTER — Ambulatory Visit: Admitting: Podiatry

## 2023-11-26 DIAGNOSIS — B351 Tinea unguium: Secondary | ICD-10-CM

## 2023-11-26 DIAGNOSIS — D2371 Other benign neoplasm of skin of right lower limb, including hip: Secondary | ICD-10-CM

## 2023-11-26 DIAGNOSIS — D2372 Other benign neoplasm of skin of left lower limb, including hip: Secondary | ICD-10-CM

## 2023-11-26 DIAGNOSIS — M79676 Pain in unspecified toe(s): Secondary | ICD-10-CM

## 2023-11-26 NOTE — Progress Notes (Signed)
 He presents today chief complaint of painful elongated toenails bilateral.  States that he has some painful calluses to.  Objective: Vital signs stable alert oriented x 3 pulses are palpable.  Toenails are long thick yellow dystrophic onychomycotic sharply incurvated multiple benign skin lesions plantar aspect of forefoot bilaterally.  Assessment: Pain limb secondary onychomycosis porokeratosis benign skin lesions.  Plan: Debridement of all benign skin lesions debridement of porokeratotic lesions debridement of toenails 1 through 5 bilateral.

## 2023-12-10 DIAGNOSIS — H43813 Vitreous degeneration, bilateral: Secondary | ICD-10-CM | POA: Diagnosis not present

## 2023-12-10 DIAGNOSIS — H35373 Puckering of macula, bilateral: Secondary | ICD-10-CM | POA: Diagnosis not present

## 2023-12-10 DIAGNOSIS — H40003 Preglaucoma, unspecified, bilateral: Secondary | ICD-10-CM | POA: Diagnosis not present

## 2023-12-10 DIAGNOSIS — H2513 Age-related nuclear cataract, bilateral: Secondary | ICD-10-CM | POA: Diagnosis not present

## 2023-12-15 ENCOUNTER — Encounter: Payer: Self-pay | Admitting: Ophthalmology

## 2023-12-15 DIAGNOSIS — H2513 Age-related nuclear cataract, bilateral: Secondary | ICD-10-CM | POA: Diagnosis not present

## 2023-12-15 DIAGNOSIS — H2511 Age-related nuclear cataract, right eye: Secondary | ICD-10-CM | POA: Diagnosis not present

## 2023-12-18 NOTE — Discharge Instructions (Signed)

## 2023-12-23 ENCOUNTER — Ambulatory Visit
Admission: RE | Admit: 2023-12-23 | Discharge: 2023-12-23 | Disposition: A | Discharge status: Home / Self Care | Attending: Ophthalmology | Admitting: Ophthalmology

## 2023-12-23 ENCOUNTER — Other Ambulatory Visit: Payer: Self-pay

## 2023-12-23 ENCOUNTER — Ambulatory Visit: Payer: Self-pay | Admitting: Anesthesiology

## 2023-12-23 ENCOUNTER — Encounter
Admission: RE | Disposition: A | Discharge status: Home / Self Care | Payer: Self-pay | Source: Home / Self Care | Attending: Ophthalmology

## 2023-12-23 ENCOUNTER — Encounter: Payer: Self-pay | Admitting: Ophthalmology

## 2023-12-23 DIAGNOSIS — F17210 Nicotine dependence, cigarettes, uncomplicated: Secondary | ICD-10-CM | POA: Insufficient documentation

## 2023-12-23 DIAGNOSIS — I129 Hypertensive chronic kidney disease with stage 1 through stage 4 chronic kidney disease, or unspecified chronic kidney disease: Secondary | ICD-10-CM | POA: Insufficient documentation

## 2023-12-23 DIAGNOSIS — E1136 Type 2 diabetes mellitus with diabetic cataract: Secondary | ICD-10-CM | POA: Insufficient documentation

## 2023-12-23 DIAGNOSIS — E1122 Type 2 diabetes mellitus with diabetic chronic kidney disease: Secondary | ICD-10-CM | POA: Insufficient documentation

## 2023-12-23 DIAGNOSIS — N182 Chronic kidney disease, stage 2 (mild): Secondary | ICD-10-CM | POA: Insufficient documentation

## 2023-12-23 DIAGNOSIS — I480 Paroxysmal atrial fibrillation: Secondary | ICD-10-CM | POA: Diagnosis not present

## 2023-12-23 DIAGNOSIS — I209 Angina pectoris, unspecified: Secondary | ICD-10-CM | POA: Insufficient documentation

## 2023-12-23 DIAGNOSIS — H2511 Age-related nuclear cataract, right eye: Secondary | ICD-10-CM | POA: Diagnosis not present

## 2023-12-23 DIAGNOSIS — E1140 Type 2 diabetes mellitus with diabetic neuropathy, unspecified: Secondary | ICD-10-CM | POA: Diagnosis not present

## 2023-12-23 HISTORY — DX: Hypertensive chronic kidney disease with stage 1 through stage 4 chronic kidney disease, or unspecified chronic kidney disease: I12.9

## 2023-12-23 HISTORY — DX: Chronic kidney disease, stage 2 (mild): N18.2

## 2023-12-23 HISTORY — DX: Peripheral vascular disease, unspecified: I73.9

## 2023-12-23 HISTORY — DX: Tobacco use: Z72.0

## 2023-12-23 HISTORY — DX: Cardiac arrhythmia, unspecified: I49.9

## 2023-12-23 HISTORY — DX: Unspecified atrial fibrillation: I48.91

## 2023-12-23 HISTORY — DX: Paroxysmal atrial fibrillation: I48.0

## 2023-12-23 HISTORY — PX: CATARACT EXTRACTION W/PHACO: SHX586

## 2023-12-23 HISTORY — DX: Abnormal electrocardiogram (ECG) (EKG): R94.31

## 2023-12-23 HISTORY — DX: Conduction disorder, unspecified: I45.9

## 2023-12-23 HISTORY — DX: Type 2 diabetes mellitus with diabetic neuropathy, unspecified: E11.40

## 2023-12-23 SURGERY — PHACOEMULSIFICATION, CATARACT, WITH IOL INSERTION
Anesthesia: Monitor Anesthesia Care | Laterality: Right

## 2023-12-23 MED ORDER — MOXIFLOXACIN HCL 0.5 % OP SOLN
OPHTHALMIC | Status: DC | PRN
  Administered 2023-12-23: .2 mL via OPHTHALMIC

## 2023-12-23 MED ORDER — BRIMONIDINE TARTRATE-TIMOLOL 0.2-0.5 % OP SOLN
OPHTHALMIC | Status: DC | PRN
  Administered 2023-12-23: 1 [drp] via OPHTHALMIC

## 2023-12-23 MED ORDER — LACTATED RINGERS IV SOLN: INTRAVENOUS | Status: DC

## 2023-12-23 MED ORDER — TETRACAINE HCL 0.5 % OP SOLN
OPHTHALMIC | Status: AC
Start: 2023-12-23 — End: 2023-12-23
  Filled 2023-12-23: qty 4

## 2023-12-23 MED ORDER — ARMC OPHTHALMIC DILATING DROPS
1.0000 | OPHTHALMIC | Status: DC | PRN
  Administered 2023-12-23 (×3): 1 via OPHTHALMIC

## 2023-12-23 MED ORDER — PHENYLEPHRINE-KETOROLAC 1-0.3 % IO SOLN
INTRAOCULAR | Status: DC | PRN
  Administered 2023-12-23: 71 mL via OPHTHALMIC

## 2023-12-23 MED ORDER — LIDOCAINE HCL (PF) 2 % IJ SOLN
INTRAOCULAR | Status: DC | PRN
  Administered 2023-12-23: 1 mL

## 2023-12-23 MED ORDER — MIDAZOLAM HCL 2 MG/2ML IJ SOLN
INTRAMUSCULAR | Status: AC
  Filled 2023-12-23: qty 2

## 2023-12-23 MED ORDER — TETRACAINE HCL 0.5 % OP SOLN
1.0000 [drp] | OPHTHALMIC | Status: DC | PRN
  Administered 2023-12-23 (×3): 1 [drp] via OPHTHALMIC

## 2023-12-23 MED ORDER — FENTANYL CITRATE (PF) 100 MCG/2ML IJ SOLN
INTRAMUSCULAR | Status: DC | PRN
  Administered 2023-12-23: 50 ug via INTRAVENOUS

## 2023-12-23 MED ORDER — SIGHTPATH DOSE#1 BSS IO SOLN
INTRAOCULAR | Status: DC | PRN
  Administered 2023-12-23: 15 mL via INTRAOCULAR

## 2023-12-23 MED ORDER — SIGHTPATH DOSE#1 NA CHONDROIT SULF-NA HYALURON 40-17 MG/ML IO SOLN
INTRAOCULAR | Status: DC | PRN
  Administered 2023-12-23: 1 mL via INTRAOCULAR

## 2023-12-23 MED ORDER — MIDAZOLAM HCL 2 MG/2ML IJ SOLN
INTRAMUSCULAR | Status: DC | PRN
Start: 2023-12-23 — End: 2023-12-23
  Administered 2023-12-23 (×2): 1 mg via INTRAVENOUS

## 2023-12-23 MED ORDER — ARMC OPHTHALMIC DILATING DROPS
OPHTHALMIC | Status: AC
  Filled 2023-12-23: qty 0.5

## 2023-12-23 MED ORDER — FENTANYL CITRATE (PF) 100 MCG/2ML IJ SOLN
INTRAMUSCULAR | Status: AC
  Filled 2023-12-23: qty 2

## 2023-12-23 SURGICAL SUPPLY — 9 items
CYSTOTOME ANGL RVRS SHRT 25G (CUTTER) ×2 IMPLANT
FEE CATARACT SUITE SIGHTPATH (MISCELLANEOUS) ×2 IMPLANT
GLOVE BIOGEL PI IND STRL 8 (GLOVE) ×2 IMPLANT
GLOVE SURG LX STRL 8.0 MICRO (GLOVE) ×2 IMPLANT
GLOVE SURG SYN 6.5 PF PI BL (GLOVE) ×2 IMPLANT
LENS IOL TECNIS EYHANCE 21.5 (Intraocular Lens) IMPLANT
NDL FILTER BLUNT 18X1 1/2 (NEEDLE) ×2 IMPLANT
NEEDLE FILTER BLUNT 18X1 1/2 (NEEDLE) ×1 IMPLANT
SYR 3ML LL SCALE MARK (SYRINGE) ×2 IMPLANT

## 2023-12-23 NOTE — Op Note (Signed)
 PREOPERATIVE DIAGNOSIS:  Nuclear sclerotic cataract of the right eye.   POSTOPERATIVE DIAGNOSIS:  Right Eye Cataract   OPERATIVE PROCEDURE:ORPROCALL@   SURGEON:  Elsie Carmine, MD.   ANESTHESIA:  Anesthesiologist: Ola Donny BROCKS, MD CRNA: Jahoo, Sonia, CRNA  1.      Managed anesthesia care. 2.      0.72ml of Shugarcaine was instilled in the eye following the paracentesis.   COMPLICATIONS:  None.   TECHNIQUE:   Stop and chop   DESCRIPTION OF PROCEDURE:  The patient was examined and consented in the preoperative holding area where the aforementioned topical anesthesia was applied to the right eye and then brought back to the Operating Room where the right eye was prepped and draped in the usual sterile ophthalmic fashion and a lid speculum was placed. A paracentesis was created with the side port blade and the anterior chamber was filled with viscoelastic. A near clear corneal incision was performed with the steel keratome. A continuous curvilinear capsulorrhexis was performed with a cystotome followed by the capsulorrhexis forceps. Hydrodissection and hydrodelineation were carried out with BSS on a blunt cannula. The lens was removed in a stop and chop  technique and the remaining cortical material was removed with the irrigation-aspiration handpiece. The capsular bag was inflated with viscoelastic and the Technis ZCB00  lens was placed in the capsular bag without complication. The remaining viscoelastic was removed from the eye with the irrigation-aspiration handpiece. The wounds were hydrated. The anterior chamber was flushed with BSS and the eye was inflated to physiologic pressure. 0.3ml of Vigamox  was placed in the anterior chamber. The wounds were found to be water  tight. The eye was dressed with Combigan . The patient was given protective glasses to wear throughout the day and a shield with which to sleep tonight. The patient was also given drops with which to begin a drop regimen today  and will follow-up with me in one day. Implant Name Type Inv. Item Serial No. Manufacturer Lot No. LRB No. Used Action  LENS IOL TECNIS EYHANCE 21.5 - [id number] Intraocular Lens LENS IOL TECNIS EYHANCE 21.5 [id number] [employer]  Right 1 Implanted   Procedure(s): PHACOEMULSIFICATION, CATARACT, WITH IOL INSERTION 9.44, 01:00.0 (Right)  Electronically signed: Elsie Carmine 12/23/2023 10:25 AM

## 2023-12-23 NOTE — Anesthesia Postprocedure Evaluation (Signed)
 Anesthesia Post Note  Patient: Steven Boyd  Procedure(s) Performed: PHACOEMULSIFICATION, CATARACT, WITH IOL INSERTION 9.44, 01:00.0 (Right)  Patient location during evaluation: PACU Anesthesia Type: MAC Level of consciousness: awake and alert Pain management: pain level controlled Vital Signs Assessment: post-procedure vital signs reviewed and stable Respiratory status: spontaneous breathing, nonlabored ventilation, respiratory function stable and patient connected to nasal cannula oxygen Cardiovascular status: stable and blood pressure returned to baseline Postop Assessment: no apparent nausea or vomiting Anesthetic complications: no   No notable events documented.   Last Vitals:  Vitals:   12/23/23 1030 12/23/23 1033  BP: 139/81 130/73  Pulse: (!) 56 (!) 56  Resp: (!) 8 18  Temp:  36.6 C  SpO2: 95% 95%    Last Pain:  Vitals:   12/23/23 1033  TempSrc:   PainSc: 0-No pain                 Yovani Cogburn C Vallory Oetken

## 2023-12-23 NOTE — Anesthesia Preprocedure Evaluation (Addendum)
 Anesthesia Evaluation  Patient identified by MRN, date of birth, ID band Patient awake    Reviewed: Allergy & Precautions, H&P , NPO status , Patient's Chart, lab work & pertinent test results  Airway Mallampati: II  TM Distance: >3 FB Neck ROM: Full    Dental no notable dental hx. (+) Partial Upper   Pulmonary Current Smoker and Patient abstained from smoking.   Pulmonary exam normal breath sounds clear to auscultation       Cardiovascular hypertension, + angina  + Peripheral Vascular Disease  Normal cardiovascular exam+ dysrhythmias  Rhythm:Regular Rate:Normal  03-01-22 EKG  The ekg ordered demonstrates sinus rhythm, rate 48, interventricular conduction delay with a left axis deviation, no acute ST-T wave changes  Sinus rhythm today on exam   Neuro/Psych negative neurological ROS  negative psych ROS   GI/Hepatic negative GI ROS, Neg liver ROS, PUD,GERD  ,,  Endo/Other  diabetes    Renal/GU Renal diseasenegative Renal ROS  negative genitourinary   Musculoskeletal negative musculoskeletal ROS (+)    Abdominal   Peds negative pediatric ROS (+)  Hematology negative hematology ROS (+)   Anesthesia Other Findings  Hypertension  Hard of hearing Prostate cancer   GERD (gastroesophageal reflux disease) Dysrhythmia  A-fib  Hypertensive chronic kidney disease Peripheral vascular disease (HCC) Chronic kidney disease, stage 2 (mild) Diabetes mellitus with diabetic neuropathy (HCC) Tobacco abuse  Paroxysmal atrial fibrillation Intraventricular conduction delay Left axis deviation         Reproductive/Obstetrics negative OB ROS                              Anesthesia Physical Anesthesia Plan  ASA: 3  Anesthesia Plan: MAC   Post-op Pain Management:    Induction: Intravenous  PONV Risk Score and Plan:   Airway Management Planned: Natural Airway and Nasal Cannula  Additional  Equipment:   Intra-op Plan:   Post-operative Plan:   Informed Consent: I have reviewed the patients History and Physical, chart, labs and discussed the procedure including the risks, benefits and alternatives for the proposed anesthesia with the patient or authorized representative who has indicated his/her understanding and acceptance.     Dental Advisory Given  Plan Discussed with: Anesthesiologist, CRNA and Surgeon  Anesthesia Plan Comments: (Patient consented for risks of anesthesia including but not limited to:  - adverse reactions to medications - damage to eyes, teeth, lips or other oral mucosa - nerve damage due to positioning  - sore throat or hoarseness - Damage to heart, brain, nerves, lungs, other parts of body or loss of life  Patient voiced understanding and assent.)         Anesthesia Quick Evaluation

## 2023-12-23 NOTE — H&P (Signed)
 [facility]   Primary Care Physician:  Bertell Satterfield, MD Ophthalmologist: Dr. Jaye   Pre-Procedure History & Physical: HPI:  Steven Boyd is a 83 y.o. male here for cataract surgery.   Past Medical History:  Diagnosis Date   A-fib (HCC)    Chronic kidney disease, stage 2 (mild)    Diabetes mellitus with diabetic neuropathy (HCC)    Dysrhythmia    GERD (gastroesophageal reflux disease)    Hard of hearing    bilateral, no hearing aids   Hypertension    Hypertensive chronic kidney disease    Intraventricular conduction delay    Left axis deviation    Paroxysmal atrial fibrillation (HCC)    Peripheral vascular disease (HCC)    Prostate cancer (HCC)    Tobacco abuse     Past Surgical History:  Procedure Laterality Date   BIOPSY  09/23/2016   Procedure: BIOPSY;  Surgeon: Golda Claudis PENNER, MD;  [facility];  Service: Endoscopy;;  gastric   COLONOSCOPY N/A 05/08/2016   Procedure: COLONOSCOPY;  Surgeon: Claudis PENNER Golda, MD;  [facility];  Service: Endoscopy;  Laterality: N/A;  2:25-moved to 100 per Ann   ESOPHAGOGASTRODUODENOSCOPY N/A 06/07/2016   Procedure: ESOPHAGOGASTRODUODENOSCOPY (EGD);  Surgeon: Claudis PENNER Golda, MD;  [facility];  Service: Endoscopy;  Laterality: N/A;  915   ESOPHAGOGASTRODUODENOSCOPY N/A 09/23/2016   Procedure: ESOPHAGOGASTRODUODENOSCOPY (EGD);  Surgeon: Golda Claudis PENNER, MD;  [facility];  Service: Endoscopy;  Laterality: N/A;  955   FEMUR IM NAIL Left 12/24/2014   Procedure: INTRAMEDULLARY (IM) NAIL FEMORAL;  Surgeon: Fonda Olmsted, MD;  [facility]: [facility];  Service: Orthopedics;  Laterality: Left;   POLYPECTOMY  05/08/2016   Procedure: POLYPECTOMY;  Surgeon: Claudis PENNER Golda, MD;  [facility];  Service: Endoscopy;;  sigmoid colon polypectomies x3     Prior to Admission medications   Medication Sig Start Date End Date Taking? Authorizing Provider  hydrALAZINE  (APRESOLINE ) 50 MG tablet Take  1 tablet by mouth twice daily 03/18/23  Yes Hochrein, Lynwood, MD  lisinopril  (PRINIVIL ,ZESTRIL ) 40 MG tablet Take 40 mg by mouth 2 (two) times daily. 05/16/18  Yes [provider]  metoprolol  succinate (TOPROL -XL) 25 MG 24 hr tablet Take 1/2 (one-half) tablet by mouth once daily 04/29/23  Yes Hochrein, Lynwood, MD  rivaroxaban  (XARELTO ) 20 MG TABS tablet Take 1 tablet (20 mg total) by mouth daily with supper. 03/24/23  Yes Lavona Lynwood, MD  tamsulosin (FLOMAX) 0.4 MG CAPS capsule Take 0.4 mg by mouth every evening.    Yes [provider]  furosemide  (LASIX ) 20 MG tablet Take 1 tablet (20 mg total) by mouth daily as needed for fluid or edema (May take 20 mg daily as needed for swelling). Patient not taking: Reported on 10/28/2023 03/05/23   Lavona Lynwood, MD  loperamide  (IMODIUM ) 2 MG capsule Take 1 capsule (2 mg total) by mouth 4 (four) times daily as needed for diarrhea or loose stools. Patient not taking: Reported on 10/28/2023 05/19/23   Patsey Lot, MD  Omega-3 Fatty Acids (FISH OIL) 1200 MG CAPS Take 500 mg by mouth daily.  Patient not taking: Reported on 10/28/2023    [provider]  ondansetron  (ZOFRAN -ODT) 4 MG disintegrating tablet Take 1 tablet (4 mg total) by mouth every 8 (eight) hours as needed for nausea or vomiting. Patient not taking: Reported on 10/28/2023 05/19/23   Patsey Lot, MD  Zinc 50 MG TABS Take 1 tablet by  mouth daily. Patient not taking: Reported on 10/28/2023    [provider]    Allergies as of 12/12/2023 - Review Complete 10/28/2023  Allergen Reaction Noted   Protonix  [pantoprazole  sodium] Nausea And Vomiting 06/26/2016   Dilaudid  [hydromorphone  hcl] Nausea And Vomiting 06/24/2016    Family History  Problem Relation Age of Onset   Diabetes Mother    Heart Problems Father        s/p heart valve repair 10 years before he passed away    Social History   Socioeconomic History   Marital status: Married    Spouse name: Not  on file   Number of children: Not on file   Years of education: Not on file   Highest education level: Not on file  Occupational History   Not on file  Tobacco Use   Smoking status: Every Day    Current packs/day: 1.00    Average packs/day: 1 pack/day for 55.0 years (55.0 ttl pk-yrs)    Types: Cigarettes   Smokeless tobacco: Never  Vaping Use   Vaping status: Never Used  Substance and Sexual Activity   Alcohol use: No   Drug use: No   Sexual activity: Not on file  Other Topics Concern   Not on file  Social History Narrative   Not on file   Social Drivers of Health   Financial Resource Strain: Not on file  Food Insecurity: Not on file  Transportation Needs: Not on file  Physical Activity: Not on file  Stress: Not on file  Social Connections: Not on file  Intimate Partner Violence: Not on file    Review of Systems: See HPI, otherwise negative ROS  Physical Exam: BP (!) 154/76   Pulse (!) 57   Temp 97.7 F (36.5 C) (Temporal)   Resp 20   Ht 6' 2 (1.88 m)   Wt 96.6 kg   SpO2 97%   BMI 27.33 kg/m  General:   Alert, cooperative. Head:  Normocephalic and atraumatic. Respiratory:  Normal work of breathing. Cardiovascular:  NAD  Impression/Plan: Steven Boyd is here for cataract surgery.  Risks, benefits, limitations, and alternatives regarding cataract surgery have been reviewed with the patient.  Questions have been answered.  All parties agreeable.   Elsie Carmine, MD  12/23/2023, 10:02 AM

## 2023-12-23 NOTE — Transfer of Care (Signed)
 Immediate Anesthesia Transfer of Care Note  Patient: Steven Boyd  Procedure(s) Performed: PHACOEMULSIFICATION, CATARACT, WITH IOL INSERTION 9.44, 01:00.0 (Right)  Patient Location: PACU  Anesthesia Type: MAC  Level of Consciousness: awake, alert  and patient cooperative  Airway and Oxygen Therapy: Patient Spontanous Breathing and Patient connected to supplemental oxygen  Post-op Assessment: Post-op Vital signs reviewed, Patient's Cardiovascular Status Stable, Respiratory Function Stable, Patent Airway and No signs of Nausea or vomiting  Post-op Vital Signs: Reviewed and stable  Complications: No notable events documented.

## 2023-12-24 DIAGNOSIS — H2512 Age-related nuclear cataract, left eye: Secondary | ICD-10-CM | POA: Diagnosis not present

## 2023-12-29 NOTE — Anesthesia Preprocedure Evaluation (Signed)
 Anesthesia Evaluation    Airway Mallampati: II  TM Distance: >3 FB     Dental  (+) Partial Upper   Pulmonary Current Smoker and Patient abstained from smoking.          Cardiovascular hypertension,   03-01-22 EKG  The ekg ordered demonstrates sinus rhythm, rate 48, interventricular conduction delay with a left axis deviation, no acute ST-T wave changes       Neuro/Psych    GI/Hepatic   Endo/Other  diabetes    Renal/GU      Musculoskeletal   Abdominal   Peds  Hematology   Anesthesia Other Findings Previous cataract surgery 12-23-23 Dr. Ola  Hypertension Hard of hearing Prostate cancer (HCC)  GERD (gastroesophageal reflux disease) Dysrhythmia A-fib (HCC) Hypertensive chronic kidney disease Peripheral vascular disease (HCC) Chronic kidney disease, stage 2 (mild) Diabetes mellitus with diabetic neuropathy (HCC) Tobacco abuse  Paroxysmal atrial fibrillation (HCC) Left axis deviation  Intraventricular conduction delay    Reproductive/Obstetrics                              Anesthesia Physical Anesthesia Plan  ASA: 3  Anesthesia Plan: MAC   Post-op Pain Management:    Induction: Intravenous  PONV Risk Score and Plan:   Airway Management Planned: Natural Airway and Nasal Cannula  Additional Equipment:   Intra-op Plan:   Post-operative Plan:   Informed Consent: I have reviewed the patients History and Physical, chart, labs and discussed the procedure including the risks, benefits and alternatives for the proposed anesthesia with the patient or authorized representative who has indicated his/her understanding and acceptance.     Dental Advisory Given  Plan Discussed with: Anesthesiologist, CRNA and Surgeon  Anesthesia Plan Comments: (Patient consented for risks of anesthesia including but not limited to:  - adverse reactions to medications - damage to eyes, teeth, lips  or other oral mucosa - nerve damage due to positioning  - sore throat or hoarseness - Damage to heart, brain, nerves, lungs, other parts of body or loss of life  Patient voiced understanding and assent.)        Anesthesia Quick Evaluation

## 2024-01-01 NOTE — Discharge Instructions (Signed)

## 2024-01-02 ENCOUNTER — Telehealth: Payer: Self-pay

## 2024-01-02 NOTE — Telephone Encounter (Signed)
 Can patient be scheduled with Dr Tobie?

## 2024-01-02 NOTE — Telephone Encounter (Signed)
 Copied from CRM #[id number]. Topic: Appointments - Scheduling Inquiry for Clinic >> Jan 02, 2024  2:27 PM Sophia H wrote: Reason for CRM: Patients son is calling in to est care with Dr. Tobie. States he spoke with Dr. Tobie last week and asked if he would take the patient on and he said he would just to call and get him scheduled. Unable to schedule on my end, please reach out to patient and advise if this can still be done with Dr. Tobie. # [phone]

## 2024-01-05 NOTE — Telephone Encounter (Signed)
 Scheduled

## 2024-01-06 ENCOUNTER — Encounter: Admission: RE | Payer: Self-pay | Source: Home / Self Care

## 2024-01-06 ENCOUNTER — Encounter: Payer: Self-pay | Admitting: Anesthesiology

## 2024-01-06 ENCOUNTER — Ambulatory Visit: Admission: RE | Admit: 2024-01-06 | Source: Home / Self Care | Admitting: Ophthalmology

## 2024-01-06 SURGERY — PHACOEMULSIFICATION, CATARACT, WITH IOL INSERTION
Anesthesia: Topical | Laterality: Left

## 2024-01-22 ENCOUNTER — Ambulatory Visit (INDEPENDENT_AMBULATORY_CARE_PROVIDER_SITE_OTHER): Admitting: Family Medicine

## 2024-01-22 VITALS — BP 130/72 | HR 57 | Temp 98.6°F | Ht 74.0 in | Wt 212.8 lb

## 2024-01-22 DIAGNOSIS — E118 Type 2 diabetes mellitus with unspecified complications: Secondary | ICD-10-CM

## 2024-01-22 DIAGNOSIS — E119 Type 2 diabetes mellitus without complications: Secondary | ICD-10-CM

## 2024-01-22 DIAGNOSIS — I739 Peripheral vascular disease, unspecified: Secondary | ICD-10-CM | POA: Diagnosis not present

## 2024-01-22 DIAGNOSIS — R0989 Other specified symptoms and signs involving the circulatory and respiratory systems: Secondary | ICD-10-CM

## 2024-01-22 DIAGNOSIS — I10 Essential (primary) hypertension: Secondary | ICD-10-CM | POA: Diagnosis not present

## 2024-01-22 DIAGNOSIS — Z720 Tobacco use: Secondary | ICD-10-CM | POA: Diagnosis not present

## 2024-01-22 NOTE — Progress Notes (Signed)
 Subjective:    Patient ID: Steven Boyd, male    DOB: 08-19-1940, 83 y.o.   MRN: [id number]  HPI  Patient is a very pleasant 83 year old Caucasian gentleman here today to establish care.  He lives at home with his wife and his son.  His daughter is his power of attorney.  She is concerned about memory loss.  The patient is adamant that he does not have any memory loss and that his memory is fine.  However there are several instances throughout our encounter today that point otherwise.  He cannot remember the names of his medications.  He could not remember that he had atrial fibrillation.  He fixates on his long-term memory and states how good it is.  However, he was unable to recall any numbers of the telephone number I gave him.  His rapid recall seems to be affected.  He also repeats himself during our conversation and does not remember the answers to questions that I provided to him and repeats the questions again.  Therefore I do believe that there is short-term memory loss.  He does not carry a diagnosis of dementia however I believe that he does have short-term memory impairment based on my limited encounter with him.  His blood pressure today is excellent 130/72.  Unfortunately he continues to smoke and has no desire to quit.  I performed a diabetic foot exam and the patient clearly has peripheral vascular disease.  I am unable to appreciate a pulse in his left foot on the dorsalis pedis.  It is very weak.  He does have palpable pulses in his right foot.  There are changes to the skin indicative of peripheral vascular disease in his toes.  He also has diminished sensation.  He has prostate cancer but this is monitored by his urologist.  He does not require colonoscopy.  At his age she is beyond the lung cancer screening. Past Medical History:  Diagnosis Date   A-fib (HCC)    Chronic kidney disease, stage 2 (mild)    Diabetes mellitus with diabetic neuropathy (HCC)    Dysrhythmia    GERD  (gastroesophageal reflux disease)    Hard of hearing    bilateral, no hearing aids   Hypertension    Hypertensive chronic kidney disease    Intraventricular conduction delay    Left axis deviation    Paroxysmal atrial fibrillation (HCC)    Peripheral vascular disease    Prostate cancer (HCC)    Tobacco abuse    Past Surgical History:  Procedure Laterality Date   BIOPSY  09/23/2016   Procedure: BIOPSY;  Surgeon: Golda Claudis PENNER, MD;  [facility];  Service: Endoscopy;;  gastric   CATARACT EXTRACTION W/PHACO Right 12/23/2023   Procedure: PHACOEMULSIFICATION, CATARACT, WITH IOL INSERTION 9.44, 01:00.0;  Surgeon: Jaye Fallow, MD;  Location: [facility];  Service: Ophthalmology;  Laterality: Right;   COLONOSCOPY N/A 05/08/2016   Procedure: COLONOSCOPY;  Surgeon: Claudis PENNER Golda, MD;  [facility];  Service: Endoscopy;  Laterality: N/A;  2:25-moved to 100 per Ann   ESOPHAGOGASTRODUODENOSCOPY N/A 06/07/2016   Procedure: ESOPHAGOGASTRODUODENOSCOPY (EGD);  Surgeon: Claudis PENNER Golda, MD;  [facility];  Service: Endoscopy;  Laterality: N/A;  915   ESOPHAGOGASTRODUODENOSCOPY N/A 09/23/2016   Procedure: ESOPHAGOGASTRODUODENOSCOPY (EGD);  Surgeon: Golda Claudis PENNER, MD;  [facility];  Service: Endoscopy;  Laterality: N/A;  955   FEMUR IM NAIL Left 12/24/2014   Procedure: INTRAMEDULLARY (IM)  NAIL FEMORAL;  Surgeon: Fonda Olmsted, MD;  Location: [facility];  Service: Orthopedics;  Laterality: Left;   POLYPECTOMY  05/08/2016   Procedure: POLYPECTOMY;  Surgeon: Claudis RAYMOND Rivet, MD;  [facility];  Service: Endoscopy;;  sigmoid colon polypectomies x3    Current Outpatient Medications on File Prior to Visit  Medication Sig Dispense Refill   furosemide  (LASIX ) 20 MG tablet Take 1 tablet (20 mg total) by mouth daily as needed for fluid or edema (May take 20 mg daily as needed for swelling). (Patient not taking: Reported on 10/28/2023) 90 tablet 3    hydrALAZINE  (APRESOLINE ) 50 MG tablet Take 1 tablet by mouth twice daily 180 tablet 3   lisinopril  (PRINIVIL ,ZESTRIL ) 40 MG tablet Take 40 mg by mouth 2 (two) times daily.     loperamide  (IMODIUM ) 2 MG capsule Take 1 capsule (2 mg total) by mouth 4 (four) times daily as needed for diarrhea or loose stools. (Patient not taking: Reported on 10/28/2023) 12 capsule 0   metoprolol  succinate (TOPROL -XL) 25 MG 24 hr tablet Take 1/2 (one-half) tablet by mouth once daily 45 tablet 3   Omega-3 Fatty Acids (FISH OIL) 1200 MG CAPS Take 500 mg by mouth daily.  (Patient not taking: Reported on 10/28/2023)     ondansetron  (ZOFRAN -ODT) 4 MG disintegrating tablet Take 1 tablet (4 mg total) by mouth every 8 (eight) hours as needed for nausea or vomiting. (Patient not taking: Reported on 10/28/2023) 8 tablet 0   rivaroxaban  (XARELTO ) 20 MG TABS tablet Take 1 tablet (20 mg total) by mouth daily with supper. 14 tablet 0   tamsulosin (FLOMAX) 0.4 MG CAPS capsule Take 0.4 mg by mouth every evening.      Zinc 50 MG TABS Take 1 tablet by mouth daily. (Patient not taking: Reported on 10/28/2023)     No current facility-administered medications on file prior to visit.   Allergies  Allergen Reactions   Protonix  [Pantoprazole  Sodium] Nausea And Vomiting    Patient states that after taking a few doses this happened , he stopped the medication.   Dilaudid  [Hydromorphone  Hcl] Nausea And Vomiting   Social History   Socioeconomic History   Marital status: Married    Spouse name: Not on file   Number of children: Not on file   Years of education: Not on file   Highest education level: Not on file  Occupational History   Not on file  Tobacco Use   Smoking status: Every Day    Current packs/day: 1.00    Average packs/day: 1 pack/day for 55.0 years (55.0 ttl pk-yrs)    Types: Cigarettes   Smokeless tobacco: Never  Vaping Use   Vaping status: Never Used  Substance and Sexual Activity   Alcohol use: No   Drug use: No    Sexual activity: Not on file  Other Topics Concern   Not on file  Social History Narrative   Not on file   Social Drivers of Health   Financial Resource Strain: Not on file  Food Insecurity: Not on file  Transportation Needs: Not on file  Physical Activity: Not on file  Stress: Not on file  Social Connections: Not on file  Intimate Partner Violence: Not on file      Review of Systems     Objective:   Physical Exam Vitals reviewed.  Constitutional:      Appearance: Normal appearance.  Cardiovascular:     Rate and Rhythm: Normal rate. Rhythm irregular.  Pulses:          Dorsalis pedis pulses are 1+ on the right side and 0 on the left side.     Heart sounds: Normal heart sounds. No murmur heard.    No friction rub. No gallop.  Pulmonary:     Effort: Pulmonary effort is normal. No respiratory distress.     Breath sounds: Normal breath sounds. No wheezing, rhonchi or rales.  Abdominal:     General: Abdomen is flat. Bowel sounds are normal. There is no distension.     Palpations: Abdomen is soft.     Tenderness: There is no abdominal tenderness. There is no right CVA tenderness or guarding.  Musculoskeletal:     Right lower leg: 1+ Edema present.     Left lower leg: 1+ Edema present.  Neurological:     General: No focal deficit present.     Mental Status: He is alert and oriented to person, place, and time. Mental status is at baseline.     Cranial Nerves: No cranial nerve deficit.     Motor: No weakness.           Assessment & Plan:  Diet-controlled diabetes mellitus (HCC)  Type 2 diabetes mellitus with complication, without long-term current use of insulin (HCC) - Plan: Hemoglobin A1c, CBC with Differential/Platelet, Comprehensive metabolic panel with GFR, Lipid panel, Microalbumin/Creatinine Ratio, Urine  Peripheral arterial disease - Plan: Hemoglobin A1c, CBC with Differential/Platelet, Comprehensive metabolic panel with GFR, Lipid panel,  Microalbumin/Creatinine Ratio, Urine  Primary hypertension - Plan: Hemoglobin A1c, CBC with Differential/Platelet, Comprehensive metabolic panel with GFR, Lipid panel, Microalbumin/Creatinine Ratio, Urine  Tobacco use  Decreased pedal pulses - Plan: VAS US  ABI WITH/WO TBI  Based on our encounter today, the patient appears to have short-term memory loss.  Is difficult to determine the severity based on our limited encounter.  His long-term memory seems to be very  good.  Blood pressure today is excellent.  I will check CBC CMP lipid panel and an A1c.  I would like to see his A1c less than 6.5 and his LDL cholesterol to be less than 70.  Schedule the patient for vascular ultrasounds to determine the severity of his peripheral vascular disease.  Patient is a smoker who has no desire to quit.  He declines any vaccinations.  Would consider starting Aricept for memory preservation but I would like to see his baseline labs first

## 2024-01-23 ENCOUNTER — Ambulatory Visit: Payer: Self-pay | Admitting: Family Medicine

## 2024-01-23 ENCOUNTER — Other Ambulatory Visit: Payer: Self-pay

## 2024-01-23 DIAGNOSIS — R413 Other amnesia: Secondary | ICD-10-CM

## 2024-01-23 LAB — CBC WITH DIFFERENTIAL/PLATELET
Absolute Lymphocytes: 1544 {cells}/uL (ref 850–3900)
Absolute Monocytes: 647 {cells}/uL (ref 200–950)
Basophils Absolute: 33 {cells}/uL (ref 0–200)
Basophils Relative: 0.5 %
Eosinophils Absolute: 99 {cells}/uL (ref 15–500)
Eosinophils Relative: 1.5 %
HCT: 47.5 % (ref 38.5–50.0)
Hemoglobin: 15.9 g/dL (ref 13.2–17.1)
MCH: 30.9 pg (ref 27.0–33.0)
MCHC: 33.5 g/dL (ref 32.0–36.0)
MCV: 92.2 fL (ref 80.0–100.0)
MPV: 11.5 fL (ref 7.5–12.5)
Monocytes Relative: 9.8 %
Neutro Abs: 4277 {cells}/uL (ref 1500–7800)
Neutrophils Relative %: 64.8 %
Platelets: 160 Thousand/uL (ref 140–400)
RBC: 5.15 Million/uL (ref 4.20–5.80)
RDW: 13.5 % (ref 11.0–15.0)
Total Lymphocyte: 23.4 %
WBC: 6.6 Thousand/uL (ref 3.8–10.8)

## 2024-01-23 LAB — LIPID PANEL
Cholesterol: 172 mg/dL (ref <200)
HDL: 46 mg/dL (ref >=40)
LDL Cholesterol (Calc): 108 mg/dL — ABNORMAL HIGH
Non-HDL Cholesterol (Calc): 126 mg/dL (ref <130)
Total CHOL/HDL Ratio: 3.7 (calc) (ref <5.0)
Triglycerides: 86 mg/dL (ref <150)

## 2024-01-23 LAB — COMPREHENSIVE METABOLIC PANEL WITH GFR
AG Ratio: 1.8 (calc) (ref 1.0–2.5)
ALT: 19 U/L (ref 9–46)
AST: 20 U/L (ref 10–35)
Albumin: 4.6 g/dL (ref 3.6–5.1)
Alkaline phosphatase (APISO): 93 U/L (ref 35–144)
BUN: 14 mg/dL (ref 7–25)
CO2: 29 mmol/L (ref 20–32)
Calcium: 9.8 mg/dL (ref 8.6–10.3)
Chloride: 104 mmol/L (ref 98–110)
Creat: 1.05 mg/dL (ref 0.70–1.22)
Globulin: 2.6 g/dL (ref 1.9–3.7)
Glucose, Bld: 90 mg/dL (ref 65–99)
Potassium: 4.6 mmol/L (ref 3.5–5.3)
Sodium: 140 mmol/L (ref 135–146)
Total Bilirubin: 0.7 mg/dL (ref 0.2–1.2)
Total Protein: 7.2 g/dL (ref 6.1–8.1)
eGFR: 71 mL/min/1.73m2 (ref >=60)

## 2024-01-23 LAB — HEMOGLOBIN A1C
Hgb A1c MFr Bld: 5.4 % (ref <5.7)
Mean Plasma Glucose: 108 mg/dL
eAG (mmol/L): 6 mmol/L

## 2024-01-23 LAB — MICROALBUMIN / CREATININE URINE RATIO
Creatinine, Urine: 137 mg/dL (ref 20–320)
Microalb Creat Ratio: 9 mg/g{creat} (ref <30)
Microalb, Ur: 1.2 mg/dL

## 2024-01-23 MED ORDER — DONEPEZIL HCL 5 MG PO TABS: 5.0000 mg | ORAL_TABLET | Freq: Every day | ORAL | 1 refills | Status: AC

## 2024-01-26 ENCOUNTER — Other Ambulatory Visit: Payer: Self-pay

## 2024-01-26 ENCOUNTER — Telehealth: Payer: Self-pay

## 2024-01-26 DIAGNOSIS — I739 Peripheral vascular disease, unspecified: Secondary | ICD-10-CM

## 2024-01-26 DIAGNOSIS — I10 Essential (primary) hypertension: Secondary | ICD-10-CM

## 2024-01-26 MED ORDER — LISINOPRIL 40 MG PO TABS: 40.0000 mg | ORAL_TABLET | Freq: Two times a day (BID) | ORAL | 1 refills | Status: AC

## 2024-01-26 NOTE — Telephone Encounter (Addendum)
 Prescription Request  01/26/2024  LOV: 01/22/24  What is the name of the medication or equipment? lisinopril  (PRINIVIL ,ZESTRIL ) 40 MG tablet [[id number]]   Have you contacted your pharmacy to request a refill? Yes   Which pharmacy would you like this sent to?  [facility] [address], [place] - [address] [place] [address] Phone: [phone] Fax: [phone]    Patient notified that their request is being sent to the clinical staff for review and that they should receive a response within 2 business days.   Please advise at [facility] [phone]  Prescription Request  01/26/2024  LOV: 01/22/24  What is the name of the medication or equipment? tamsulosin (FLOMAX) 0.4 MG CAPS capsule [[id number]]   Have you contacted your pharmacy to request a refill? Yes   Which pharmacy would you like this sent to?  [facility] [address], [place] - [address] [place] [address] Phone: [phone] Fax: [phone]    Patient notified that their request is being sent to the clinical staff for review and that they should receive a response within 2 business days.   Please advise at [facility] [phone]

## 2024-01-28 ENCOUNTER — Ambulatory Visit: Admitting: Internal Medicine

## 2024-01-28 ENCOUNTER — Encounter (HOSPITAL_COMMUNITY)

## 2024-01-29 DIAGNOSIS — H35373 Puckering of macula, bilateral: Secondary | ICD-10-CM | POA: Diagnosis not present

## 2024-01-29 DIAGNOSIS — H2512 Age-related nuclear cataract, left eye: Secondary | ICD-10-CM | POA: Diagnosis not present

## 2024-02-18 ENCOUNTER — Ambulatory Visit (HOSPITAL_COMMUNITY)
Admission: RE | Admit: 2024-02-18 | Discharge: 2024-02-18 | Disposition: A | Discharge status: Home / Self Care | Source: Ambulatory Visit | Attending: Family Medicine | Admitting: Family Medicine

## 2024-02-18 DIAGNOSIS — R0989 Other specified symptoms and signs involving the circulatory and respiratory systems: Secondary | ICD-10-CM | POA: Diagnosis not present

## 2024-02-20 LAB — VAS US ABI WITH/WO TBI: Right ABI: 1.29

## 2024-02-20 NOTE — Progress Notes (Signed)
 Lvm for pt to return my call.

## 2024-03-02 ENCOUNTER — Other Ambulatory Visit: Payer: Self-pay | Admitting: Cardiology

## 2024-03-07 ENCOUNTER — Other Ambulatory Visit: Payer: Self-pay | Admitting: Cardiology

## 2024-03-31 ENCOUNTER — Ambulatory Visit: Admitting: Podiatry

## 2024-03-31 ENCOUNTER — Ambulatory Visit

## 2024-04-10 ENCOUNTER — Other Ambulatory Visit: Payer: Self-pay | Admitting: Cardiology

## 2024-04-28 ENCOUNTER — Ambulatory Visit: Admitting: Podiatry

## 2024-04-28 DIAGNOSIS — M79676 Pain in unspecified toe(s): Secondary | ICD-10-CM

## 2024-04-28 DIAGNOSIS — D2372 Other benign neoplasm of skin of left lower limb, including hip: Secondary | ICD-10-CM | POA: Diagnosis not present

## 2024-04-28 DIAGNOSIS — B351 Tinea unguium: Secondary | ICD-10-CM

## 2024-04-28 DIAGNOSIS — D2371 Other benign neoplasm of skin of right lower limb, including hip: Secondary | ICD-10-CM

## 2024-04-28 NOTE — Progress Notes (Signed)
 He presents today chief complaint of painful elongated toenails bilateral.  States that he has some painful calluses to.  Objective: Vital signs stable alert oriented x 3 pulses are palpable.  Toenails are long thick yellow dystrophic onychomycotic sharply incurvated multiple benign skin lesions plantar aspect of forefoot bilaterally.  Assessment: Pain limb secondary onychomycosis porokeratosis benign skin lesions.  Plan: Debridement of all benign skin lesions debridement of porokeratotic lesions debridement of toenails 1 through 5 bilateral.

## 2024-04-29 ENCOUNTER — Other Ambulatory Visit: Payer: Self-pay

## 2024-04-29 ENCOUNTER — Telehealth: Payer: Self-pay | Admitting: Family Medicine

## 2024-04-29 ENCOUNTER — Other Ambulatory Visit

## 2024-04-29 DIAGNOSIS — I4891 Unspecified atrial fibrillation: Secondary | ICD-10-CM

## 2024-04-29 DIAGNOSIS — C61 Malignant neoplasm of prostate: Secondary | ICD-10-CM

## 2024-04-29 DIAGNOSIS — I739 Peripheral vascular disease, unspecified: Secondary | ICD-10-CM

## 2024-04-29 MED ORDER — RIVAROXABAN 20 MG PO TABS: 20.0000 mg | ORAL_TABLET | Freq: Every day | ORAL | 1 refills | Status: AC

## 2024-04-29 NOTE — Telephone Encounter (Unsigned)
 Copied from CRM #[id number]. Topic: Clinical - Medication Question >> Apr 29, 2024  1:36 PM Tonda B wrote: Reason for CRM: patient daughter is calling she has questions about medication asprin while taking other rx [phone] ask for sharon

## 2024-04-30 ENCOUNTER — Other Ambulatory Visit: Payer: Self-pay

## 2024-04-30 ENCOUNTER — Telehealth: Payer: Self-pay | Admitting: Family Medicine

## 2024-04-30 LAB — PSA: Prostate Specific Ag, Serum: 6.2 ng/mL — ABNORMAL HIGH (ref 0.0–4.0)

## 2024-04-30 MED ORDER — TAMSULOSIN HCL 0.4 MG PO CAPS: 0.4000 mg | ORAL_CAPSULE | Freq: Every evening | ORAL | 1 refills | Status: AC

## 2024-04-30 NOTE — Telephone Encounter (Signed)
 Sent in medication

## 2024-04-30 NOTE — Telephone Encounter (Signed)
 Prescription Request  04/30/2024  LOV: 01/22/2024  What is the name of the medication or equipment?   tamsulosin  (FLOMAX ) 0.4 MG CAPS capsule  **90 day script requested**  Have you contacted your pharmacy to request a refill? Yes   Which pharmacy would you like this sent to?  [facility] [address] [place] - [address] [place] [address] Phone: [phone] Fax: [phone]    Patient notified that their request is being sent to the clinical staff for review and that they should receive a response within 2 business days.   Please advise pharmacist.

## 2024-05-08 ENCOUNTER — Other Ambulatory Visit: Payer: Self-pay | Admitting: Cardiology

## 2024-05-11 ENCOUNTER — Ambulatory Visit: Admitting: Urology

## 2024-05-11 ENCOUNTER — Encounter: Payer: Self-pay | Admitting: Urology

## 2024-05-11 VITALS — BP 120/68 | HR 78

## 2024-05-11 DIAGNOSIS — N401 Enlarged prostate with lower urinary tract symptoms: Secondary | ICD-10-CM

## 2024-05-11 DIAGNOSIS — C61 Malignant neoplasm of prostate: Secondary | ICD-10-CM

## 2024-05-11 DIAGNOSIS — R351 Nocturia: Secondary | ICD-10-CM | POA: Diagnosis not present

## 2024-05-11 LAB — URINALYSIS, ROUTINE W REFLEX MICROSCOPIC
Bilirubin, UA: NEGATIVE
Glucose, UA: NEGATIVE
Leukocytes,UA: NEGATIVE
Nitrite, UA: NEGATIVE
RBC, UA: NEGATIVE
Specific Gravity, UA: 1.02 (ref 1.005–1.030)
Urobilinogen, Ur: 1 mg/dL (ref 0.2–1.0)
pH, UA: 6 (ref 5.0–7.5)

## 2024-05-11 LAB — MICROSCOPIC EXAMINATION: Bacteria, UA: NONE SEEN

## 2024-05-11 NOTE — Progress Notes (Signed)
 "    Impression/Assessment:  1.  Grade group 2 prostate cancer, on active surveillance.  Last biopsy just over 3 years ago.  PSA most recently 6.2  2.  BPH with nocturia-stable.  On tamsulosin  voiding well  Plan:  1.  At this point, I think it is fine to just follow PSA rather than further diagnostic testing  2.  I will have him come back in in about 6 months to see Dr. Nieves following PSA   History of Present Illness: He underwent ultrasound and biopsy of his prostate on 07/25/2015. At that time, PSA was rising and was 5.8. Prostatic volume was 117 cc. PSA density 0.05.  2/12 cores positive for adenocarcinoma (right base medial, right mid medial), both GS 3+3  both revealed 20% of the cores involved with cancer.  IPSS-10, quality of life. 2(initial)   Following consultation, the patient decided on active surveillance.   His Oncotype DX results areas follows:  GPS score 15  Likelihood of favorable pathology--92%  Likelihood of low-grade disease--95%  Likelihood of organ confined disease--96%.  SHIM- 1   07/23/2016: PSA  3.6  01/13/2017--PSA 4.4   06/23/2017: MRI of the prostate performed. This revealed a single focus of signal abnormality at the right base, PI-RADS 2 lesion.prostatic volume was measured at 87 mL. There was no evidence of trans-capsular spread, seminal vesicle involvement, neurovascular bundle involvement, pelvic adenopathy or bony metastatic process.   4.2.2019: surveillance biopsy. Prostatic volume was 96 mL. 3/12 cores revealed adenocarcinoma: Right base lateral, GS 3+3 ( 5% of core), right base medial GS 3+3 (40% of core), left base lateral GS 3+3 (10% of core).   10.8.2019: PSA 4.7. 9.21.2021: PSA 8.3   10.15.2021: MRI of prostate. 1.  PI-RADS category 4 lesion in the right para midline apical posterior peripheral zone 2.  No evidence of extracapsular extension, neurovascular bundle or seminal vesicle involvement or suspicious bone lesion. 3.  Prostate volume  88 mL   11.5.2021: Fusion biopsy.  Prostate volume 95 mL.  All 4 cores from region of interest were negative for adenocarcinoma.  5/12 cores from sextant biopsies revealed adenocarcinoma: 3 cores revealed GS 3+3 pattern in 5%, 5% and 15% of cores (left mid lateral, right mid medial, left base lateral) 2 cores revealed GS 3+4 pattern in 10 in 5% of cores (right mid lateral, right base lateral)   4.16.2024: First visit since his fusion biopsy in [place] in 2021.  He has not had recent PSA.  Reason for visit is nocturnal frequency.  He is on Flomax .  He drinks a fair amount of caffeinated beverages in the afternoon. PSA 9.7  8.6.2024: Still having nocturia x 3-4.  Does not limit fluid intake in the evenings.  1 caffeinated drink in the afternoon.  Does not limit sodium.  Does have some swelling on his ankles.  Does complain that his scrotum hangs down low and sometimes hits the water . PSA 9.4  4.14.2025: MRI prostate.  Prostate volume 112 mL.  8 mm PI-RADS category 4 lesion in right peripheral zone.  No extra prostatic abnormalities  7.8.2025: Here today for recheck.  Most recent PSA 6.9.he states that he has been voiding well.  His daughter Reena says that he has had more issues with cognitive function recently.  1.20.2026: Here today for recheck.  His PSA last week was 6.2, lowest in a while.  He denies significant lower urinary tract symptoms.  IPSS 3/1.  On tamsulosin .  Cognitively he is getting worse.  Past Medical History:  Diagnosis Date   A-fib (HCC)    Chronic kidney disease, stage 2 (mild)    Diabetes mellitus with diabetic neuropathy (HCC)    Dysrhythmia    GERD (gastroesophageal reflux disease)    Hard of hearing    bilateral, no hearing aids   Hypertension    Hypertensive chronic kidney disease    Intraventricular conduction delay    Left axis deviation    Paroxysmal atrial fibrillation (HCC)    Peripheral vascular disease    Prostate cancer (HCC)    Tobacco abuse      Past Surgical History:  Procedure Laterality Date   BIOPSY  09/23/2016   Procedure: BIOPSY;  Surgeon: Golda Claudis PENNER, MD;  Location: [facility];  Service: Endoscopy;;  gastric   CATARACT EXTRACTION W/PHACO Right 12/23/2023   Procedure: PHACOEMULSIFICATION, CATARACT, WITH IOL INSERTION 9.44, 01:00.0;  Surgeon: Jaye Fallow, MD;  Location: [facility];  Service: Ophthalmology;  Laterality: Right;   COLONOSCOPY N/A 05/08/2016   Procedure: COLONOSCOPY;  Surgeon: Claudis PENNER Golda, MD;  Location: [facility];  Service: Endoscopy;  Laterality: N/A;  2:25-moved to 100 per Ann   ESOPHAGOGASTRODUODENOSCOPY N/A 06/07/2016   Procedure: ESOPHAGOGASTRODUODENOSCOPY (EGD);  Surgeon: Claudis PENNER Golda, MD;  Location: [facility];  Service: Endoscopy;  Laterality: N/A;  915   ESOPHAGOGASTRODUODENOSCOPY N/A 09/23/2016   Procedure: ESOPHAGOGASTRODUODENOSCOPY (EGD);  Surgeon: Golda Claudis PENNER, MD;  Location: [facility];  Service: Endoscopy;  Laterality: N/A;  955   FEMUR IM NAIL Left 12/24/2014   Procedure: INTRAMEDULLARY (IM) NAIL FEMORAL;  Surgeon: Fonda Olmsted, MD;  Location: [facility];  Service: Orthopedics;  Laterality: Left;   POLYPECTOMY  05/08/2016   Procedure: POLYPECTOMY;  Surgeon: Claudis PENNER Golda, MD;  Location: [facility];  Service: Endoscopy;;  sigmoid colon polypectomies x3     Home Medications:  Allergies as of 05/11/2024       Reactions   Protonix  [pantoprazole  Sodium] Nausea And Vomiting   Patient states that after taking a few doses this happened , he stopped the medication.   Dilaudid  [hydromorphone  Hcl] Nausea And Vomiting        Medication List        Accurate as of May 11, 2024  1:31 PM. If you have any questions, ask your nurse or doctor.          donepezil  5 MG tablet Commonly known as: ARICEPT  Take 1 tablet (5 mg total) by mouth at bedtime.   Fish Oil 1200 MG Caps Take 500 mg by mouth daily.   furosemide  20 MG tablet Commonly known as:  LASIX  Take 1 tablet (20 mg total) by mouth daily as needed for fluid or edema (May take 20 mg daily as needed for swelling).   hydrALAZINE  50 MG tablet Commonly known as: APRESOLINE  Take 1 tablet (50 mg total) by mouth 2 (two) times daily.   lisinopril  40 MG tablet Commonly known as: ZESTRIL  Take 1 tablet (40 mg total) by mouth 2 (two) times daily.   loperamide  2 MG capsule Commonly known as: IMODIUM  Take 1 capsule (2 mg total) by mouth 4 (four) times daily as needed for diarrhea or loose stools.   metoprolol  succinate 25 MG 24 hr tablet Commonly known as: TOPROL -XL Take 1/2 (one-half) tablet by mouth once daily   ondansetron  4 MG disintegrating tablet Commonly known as: ZOFRAN -ODT Take 1 tablet (4 mg total) by mouth every 8 (eight) hours as needed for nausea or vomiting.  rivaroxaban  20 MG Tabs tablet Commonly known as: XARELTO  Take 1 tablet (20 mg total) by mouth daily with supper.   tamsulosin  0.4 MG Caps capsule Commonly known as: FLOMAX  Take 1 capsule (0.4 mg total) by mouth every evening.   Zinc 50 MG Tabs Take 1 tablet by mouth daily.        Allergies:  Allergies  Allergen Reactions   Protonix  [Pantoprazole  Sodium] Nausea And Vomiting    Patient states that after taking a few doses this happened , he stopped the medication.   Dilaudid  [Hydromorphone  Hcl] Nausea And Vomiting    Family History  Problem Relation Age of Onset   Diabetes Mother    Heart Problems Father        s/p heart valve repair 10 years before he passed away    Social History:  reports that he has been smoking cigarettes. He has a 55 pack-year smoking history. He has never used smokeless tobacco. He reports that he does not drink alcohol and does not use drugs.  ROS: A complete review of systems was performed.  All systems are negative except for pertinent findings as noted.  Physical Exam:  Vital signs in last 24 hours: BP 120/68   Pulse 78  Constitutional:  Alert and oriented, No  acute distress Cardiovascular: Regular rate  Respiratory: Normal respiratory effort Neurologic: Grossly intact, no focal deficits Psychiatric: Normal mood and affect  I have reviewed prior pt notes  I have reviewed urinalysis results--clear  I have independently reviewed prior imaging-prostate MRI from April of 2025  PSA and pathology results reviewed  Most recent CBC/BMP results reviewed  IPSS reviewed-3/1   "

## 2024-05-18 ENCOUNTER — Other Ambulatory Visit: Payer: Self-pay | Admitting: Cardiology

## 2024-05-21 ENCOUNTER — Other Ambulatory Visit: Payer: Self-pay | Admitting: Cardiology

## 2024-05-25 ENCOUNTER — Other Ambulatory Visit: Payer: Self-pay | Admitting: Cardiology

## 2024-05-25 ENCOUNTER — Ambulatory Visit: Admitting: Family Medicine

## 2024-06-01 ENCOUNTER — Ambulatory Visit: Admitting: Family Medicine

## 2024-07-28 ENCOUNTER — Ambulatory Visit: Admitting: Podiatry

## 2024-11-16 ENCOUNTER — Other Ambulatory Visit

## 2024-11-22 ENCOUNTER — Ambulatory Visit: Admitting: Urology
